# Patient Record
Sex: Male | Born: 1945 | Race: White | Hispanic: No | Marital: Single | Smoking: Never smoker
Health system: Southern US, Community
[De-identification: ages and names within clinical notes are randomized; demographics above are authoritative.]

---

## 2005-11-30 ENCOUNTER — Emergency Department: Payer: Self-pay | Admitting: Emergency Medicine

## 2005-12-04 ENCOUNTER — Emergency Department: Payer: Self-pay | Admitting: Emergency Medicine

## 2006-08-26 ENCOUNTER — Emergency Department: Payer: Self-pay | Admitting: Unknown Physician Specialty

## 2015-12-30 ENCOUNTER — Inpatient Hospital Stay
Admission: EM | Admit: 2015-12-30 | Discharge: 2016-02-15 | DRG: 870 | Disposition: E | Payer: Medicare Other | Attending: Internal Medicine | Admitting: Internal Medicine

## 2015-12-30 ENCOUNTER — Emergency Department: Payer: Medicare Other

## 2015-12-30 ENCOUNTER — Encounter: Payer: Self-pay | Admitting: Radiology

## 2015-12-30 DIAGNOSIS — J189 Pneumonia, unspecified organism: Secondary | ICD-10-CM | POA: Insufficient documentation

## 2015-12-30 DIAGNOSIS — Z681 Body mass index (BMI) 19 or less, adult: Secondary | ICD-10-CM | POA: Diagnosis not present

## 2015-12-30 DIAGNOSIS — J101 Influenza due to other identified influenza virus with other respiratory manifestations: Secondary | ICD-10-CM | POA: Diagnosis not present

## 2015-12-30 DIAGNOSIS — R6520 Severe sepsis without septic shock: Secondary | ICD-10-CM | POA: Diagnosis present

## 2015-12-30 DIAGNOSIS — D62 Acute posthemorrhagic anemia: Secondary | ICD-10-CM | POA: Diagnosis not present

## 2015-12-30 DIAGNOSIS — T798XXA Other early complications of trauma, initial encounter: Secondary | ICD-10-CM

## 2015-12-30 DIAGNOSIS — E8770 Fluid overload, unspecified: Secondary | ICD-10-CM | POA: Diagnosis not present

## 2015-12-30 DIAGNOSIS — I248 Other forms of acute ischemic heart disease: Secondary | ICD-10-CM | POA: Diagnosis present

## 2015-12-30 DIAGNOSIS — T380X5A Adverse effect of glucocorticoids and synthetic analogues, initial encounter: Secondary | ICD-10-CM | POA: Diagnosis not present

## 2015-12-30 DIAGNOSIS — L89150 Pressure ulcer of sacral region, unstageable: Secondary | ICD-10-CM | POA: Diagnosis not present

## 2015-12-30 DIAGNOSIS — J9621 Acute and chronic respiratory failure with hypoxia: Secondary | ICD-10-CM | POA: Diagnosis not present

## 2015-12-30 DIAGNOSIS — J1008 Influenza due to other identified influenza virus with other specified pneumonia: Secondary | ICD-10-CM | POA: Diagnosis present

## 2015-12-30 DIAGNOSIS — I4891 Unspecified atrial fibrillation: Secondary | ICD-10-CM | POA: Diagnosis not present

## 2015-12-30 DIAGNOSIS — R042 Hemoptysis: Secondary | ICD-10-CM | POA: Diagnosis not present

## 2015-12-30 DIAGNOSIS — R69 Illness, unspecified: Secondary | ICD-10-CM | POA: Diagnosis not present

## 2015-12-30 DIAGNOSIS — R000 Tachycardia, unspecified: Secondary | ICD-10-CM | POA: Diagnosis not present

## 2015-12-30 DIAGNOSIS — A419 Sepsis, unspecified organism: Secondary | ICD-10-CM | POA: Diagnosis not present

## 2015-12-30 DIAGNOSIS — E875 Hyperkalemia: Secondary | ICD-10-CM | POA: Diagnosis not present

## 2015-12-30 DIAGNOSIS — J9601 Acute respiratory failure with hypoxia: Secondary | ICD-10-CM | POA: Diagnosis present

## 2015-12-30 DIAGNOSIS — I469 Cardiac arrest, cause unspecified: Secondary | ICD-10-CM | POA: Diagnosis not present

## 2015-12-30 DIAGNOSIS — K921 Melena: Secondary | ICD-10-CM | POA: Diagnosis not present

## 2015-12-30 DIAGNOSIS — J129 Viral pneumonia, unspecified: Secondary | ICD-10-CM | POA: Diagnosis present

## 2015-12-30 DIAGNOSIS — J9600 Acute respiratory failure, unspecified whether with hypoxia or hypercapnia: Secondary | ICD-10-CM

## 2015-12-30 DIAGNOSIS — J811 Chronic pulmonary edema: Secondary | ICD-10-CM | POA: Diagnosis not present

## 2015-12-30 DIAGNOSIS — F05 Delirium due to known physiological condition: Secondary | ICD-10-CM | POA: Diagnosis not present

## 2015-12-30 DIAGNOSIS — R6521 Severe sepsis with septic shock: Secondary | ICD-10-CM | POA: Diagnosis present

## 2015-12-30 DIAGNOSIS — N179 Acute kidney failure, unspecified: Secondary | ICD-10-CM | POA: Diagnosis present

## 2015-12-30 DIAGNOSIS — I10 Essential (primary) hypertension: Secondary | ICD-10-CM | POA: Diagnosis present

## 2015-12-30 DIAGNOSIS — R140 Abdominal distension (gaseous): Secondary | ICD-10-CM

## 2015-12-30 DIAGNOSIS — Y92230 Patient room in hospital as the place of occurrence of the external cause: Secondary | ICD-10-CM | POA: Diagnosis not present

## 2015-12-30 DIAGNOSIS — E876 Hypokalemia: Secondary | ICD-10-CM | POA: Diagnosis not present

## 2015-12-30 DIAGNOSIS — J80 Acute respiratory distress syndrome: Secondary | ICD-10-CM | POA: Diagnosis not present

## 2015-12-30 DIAGNOSIS — R739 Hyperglycemia, unspecified: Secondary | ICD-10-CM | POA: Diagnosis present

## 2015-12-30 DIAGNOSIS — J9690 Respiratory failure, unspecified, unspecified whether with hypoxia or hypercapnia: Secondary | ICD-10-CM

## 2015-12-30 DIAGNOSIS — K567 Ileus, unspecified: Secondary | ICD-10-CM | POA: Diagnosis not present

## 2015-12-30 DIAGNOSIS — R079 Chest pain, unspecified: Secondary | ICD-10-CM | POA: Diagnosis not present

## 2015-12-30 DIAGNOSIS — R0600 Dyspnea, unspecified: Secondary | ICD-10-CM

## 2015-12-30 DIAGNOSIS — I959 Hypotension, unspecified: Secondary | ICD-10-CM | POA: Diagnosis not present

## 2015-12-30 DIAGNOSIS — A4189 Other specified sepsis: Principal | ICD-10-CM | POA: Diagnosis present

## 2015-12-30 DIAGNOSIS — R918 Other nonspecific abnormal finding of lung field: Secondary | ICD-10-CM

## 2015-12-30 DIAGNOSIS — W06XXXA Fall from bed, initial encounter: Secondary | ICD-10-CM | POA: Diagnosis not present

## 2015-12-30 DIAGNOSIS — R1110 Vomiting, unspecified: Secondary | ICD-10-CM

## 2015-12-30 DIAGNOSIS — Z452 Encounter for adjustment and management of vascular access device: Secondary | ICD-10-CM

## 2015-12-30 DIAGNOSIS — Z9911 Dependence on respirator [ventilator] status: Secondary | ICD-10-CM | POA: Diagnosis not present

## 2015-12-30 DIAGNOSIS — J849 Interstitial pulmonary disease, unspecified: Secondary | ICD-10-CM | POA: Diagnosis not present

## 2015-12-30 DIAGNOSIS — R410 Disorientation, unspecified: Secondary | ICD-10-CM | POA: Diagnosis not present

## 2015-12-30 DIAGNOSIS — L8990 Pressure ulcer of unspecified site, unspecified stage: Secondary | ICD-10-CM | POA: Insufficient documentation

## 2015-12-30 DIAGNOSIS — Z4659 Encounter for fitting and adjustment of other gastrointestinal appliance and device: Secondary | ICD-10-CM

## 2015-12-30 LAB — URINALYSIS COMPLETE WITH MICROSCOPIC (ARMC ONLY)
BACTERIA UA: NONE SEEN
BILIRUBIN URINE: NEGATIVE
Glucose, UA: NEGATIVE mg/dL
Ketones, ur: NEGATIVE mg/dL
LEUKOCYTES UA: NEGATIVE
NITRITE: NEGATIVE
PH: 5 (ref 5.0–8.0)
PROTEIN: 100 mg/dL — AB
RBC / HPF: NONE SEEN RBC/hpf (ref 0–5)
Specific Gravity, Urine: 1.049 — ABNORMAL HIGH (ref 1.005–1.030)

## 2015-12-30 LAB — CBC WITH DIFFERENTIAL/PLATELET
Basophils Absolute: 0 10*3/uL (ref 0–0.1)
Basophils Relative: 0 %
Eosinophils Absolute: 0 10*3/uL (ref 0–0.7)
Eosinophils Relative: 0 %
HEMATOCRIT: 53 % — AB (ref 40.0–52.0)
HEMOGLOBIN: 18 g/dL (ref 13.0–18.0)
LYMPHS ABS: 0.5 10*3/uL — AB (ref 1.0–3.6)
LYMPHS PCT: 7 %
MCH: 29.9 pg (ref 26.0–34.0)
MCHC: 34 g/dL (ref 32.0–36.0)
MCV: 87.8 fL (ref 80.0–100.0)
Monocytes Absolute: 0.8 10*3/uL (ref 0.2–1.0)
Monocytes Relative: 11 %
NEUTROS ABS: 5.9 10*3/uL (ref 1.4–6.5)
NEUTROS PCT: 82 %
Platelets: 196 10*3/uL (ref 150–440)
RBC: 6.03 MIL/uL — AB (ref 4.40–5.90)
RDW: 13.9 % (ref 11.5–14.5)
WBC: 7.3 10*3/uL (ref 3.8–10.6)

## 2015-12-30 LAB — BLOOD GAS, VENOUS
ACID-BASE EXCESS: 4.8 mmol/L — AB (ref 0.0–3.0)
Bicarbonate: 30.4 mEq/L — ABNORMAL HIGH (ref 21.0–28.0)
FIO2: 0.21
PH VEN: 7.41 (ref 7.320–7.430)
Patient temperature: 37
pCO2, Ven: 48 mmHg (ref 44.0–60.0)
pO2, Ven: <31 mmHg (ref 30.0–45.0)

## 2015-12-30 LAB — PROTIME-INR
INR: 1.01
PROTHROMBIN TIME: 13.5 s (ref 11.4–15.0)

## 2015-12-30 LAB — TROPONIN I
TROPONIN I: 0.05 ng/mL — AB (ref <0.031)
Troponin I: 0.05 ng/mL — ABNORMAL HIGH (ref <0.031)
Troponin I: 0.06 ng/mL — ABNORMAL HIGH (ref <0.031)

## 2015-12-30 LAB — LACTIC ACID, PLASMA
LACTIC ACID, VENOUS: 1.6 mmol/L (ref 0.5–2.0)
Lactic Acid, Venous: 2.7 mmol/L (ref 0.5–2.0)

## 2015-12-30 LAB — COMPREHENSIVE METABOLIC PANEL
ALBUMIN: 3.4 g/dL — AB (ref 3.5–5.0)
ALT: 41 U/L (ref 17–63)
ANION GAP: 10 (ref 5–15)
AST: 85 U/L — ABNORMAL HIGH (ref 15–41)
Alkaline Phosphatase: 58 U/L (ref 38–126)
BUN: 49 mg/dL — ABNORMAL HIGH (ref 6–20)
CHLORIDE: 92 mmol/L — AB (ref 101–111)
CO2: 31 mmol/L (ref 22–32)
Calcium: 8.4 mg/dL — ABNORMAL LOW (ref 8.9–10.3)
Creatinine, Ser: 1.76 mg/dL — ABNORMAL HIGH (ref 0.61–1.24)
GFR calc non Af Amer: 37 mL/min — ABNORMAL LOW (ref >60)
GFR, EST AFRICAN AMERICAN: 43 mL/min — AB (ref >60)
GLUCOSE: 139 mg/dL — AB (ref 65–99)
Potassium: 3.8 mmol/L (ref 3.5–5.1)
SODIUM: 133 mmol/L — AB (ref 135–145)
Total Bilirubin: 0.6 mg/dL (ref 0.3–1.2)
Total Protein: 7.2 g/dL (ref 6.5–8.1)

## 2015-12-30 LAB — RAPID INFLUENZA A&B ANTIGENS: Influenza B (ARMC): NEGATIVE

## 2015-12-30 LAB — RAPID INFLUENZA A&B ANTIGENS (ARMC ONLY): INFLUENZA A (ARMC): NEGATIVE

## 2015-12-30 MED ORDER — ONDANSETRON HCL 4 MG/2ML IJ SOLN: 4.0000 mg | Freq: Four times a day (QID) | INTRAMUSCULAR | Status: DC | PRN

## 2015-12-30 MED ORDER — DEXTROSE 5 % IV SOLN
1.0000 g | INTRAVENOUS | Status: DC
  Administered 2015-12-31: 1 g via INTRAVENOUS
  Filled 2015-12-30: qty 10

## 2015-12-30 MED ORDER — ACETAMINOPHEN 650 MG RE SUPP: 650.0000 mg | Freq: Four times a day (QID) | RECTAL | Status: DC | PRN

## 2015-12-30 MED ORDER — IPRATROPIUM-ALBUTEROL 0.5-2.5 (3) MG/3ML IN SOLN
3.0000 mL | Freq: Once | RESPIRATORY_TRACT | Status: AC
  Administered 2015-12-30: 3 mL via RESPIRATORY_TRACT
  Filled 2015-12-30: qty 3

## 2015-12-30 MED ORDER — SODIUM CHLORIDE 0.9 % IV BOLUS (SEPSIS)
1000.0000 mL | Freq: Once | INTRAVENOUS | Status: AC
  Administered 2015-12-30: 1000 mL via INTRAVENOUS

## 2015-12-30 MED ORDER — METHYLPREDNISOLONE SODIUM SUCC 125 MG IJ SOLR
125.0000 mg | INTRAMUSCULAR | Status: AC
  Administered 2015-12-30: 125 mg via INTRAVENOUS
  Filled 2015-12-30: qty 2

## 2015-12-30 MED ORDER — BISACODYL 5 MG PO TBEC: 5.0000 mg | DELAYED_RELEASE_TABLET | Freq: Every day | ORAL | Status: DC | PRN

## 2015-12-30 MED ORDER — DEXTROSE 5 % IV SOLN
500.0000 mg | INTRAVENOUS | Status: DC
  Filled 2015-12-30 (×2): qty 500

## 2015-12-30 MED ORDER — POLYETHYLENE GLYCOL 3350 17 G PO PACK
17.0000 g | PACK | Freq: Every day | ORAL | Status: DC | PRN
Start: 2015-12-30 — End: 2016-01-03

## 2015-12-30 MED ORDER — HYDROCOD POLST-CPM POLST ER 10-8 MG/5ML PO SUER
5.0000 mL | Freq: Two times a day (BID) | ORAL | Status: DC | PRN
  Administered 2015-12-30: 5 mL via ORAL
  Filled 2015-12-30: qty 5

## 2015-12-30 MED ORDER — ENOXAPARIN SODIUM 40 MG/0.4ML ~~LOC~~ SOLN
40.0000 mg | SUBCUTANEOUS | Status: DC
  Administered 2015-12-31 – 2016-01-01 (×2): 40 mg via SUBCUTANEOUS
  Filled 2015-12-30 (×2): qty 0.4

## 2015-12-30 MED ORDER — IOHEXOL 300 MG/ML  SOLN: 75.0000 mL | Freq: Once | INTRAMUSCULAR | Status: DC | PRN

## 2015-12-30 MED ORDER — ASPIRIN 81 MG PO CHEW
324.0000 mg | CHEWABLE_TABLET | Freq: Once | ORAL | Status: AC
  Administered 2015-12-30: 324 mg via ORAL
  Filled 2015-12-30: qty 4

## 2015-12-30 MED ORDER — ACETAMINOPHEN 325 MG PO TABS
650.0000 mg | ORAL_TABLET | Freq: Four times a day (QID) | ORAL | Status: DC | PRN
  Administered 2016-01-01: 650 mg via ORAL
  Filled 2015-12-30 (×3): qty 2

## 2015-12-30 MED ORDER — IPRATROPIUM-ALBUTEROL 0.5-2.5 (3) MG/3ML IN SOLN: 3.0000 mL | RESPIRATORY_TRACT | Status: DC | PRN

## 2015-12-30 MED ORDER — IOHEXOL 350 MG/ML SOLN
75.0000 mL | Freq: Once | INTRAVENOUS | Status: AC | PRN
  Administered 2015-12-30: 75 mL via INTRAVENOUS

## 2015-12-30 MED ORDER — DEXTROSE 5 % IV SOLN
1.0000 g | Freq: Once | INTRAVENOUS | Status: AC
  Administered 2015-12-30: 1 g via INTRAVENOUS
  Filled 2015-12-30: qty 10

## 2015-12-30 MED ORDER — DEXTROSE 5 % IV SOLN
500.0000 mg | Freq: Once | INTRAVENOUS | Status: AC
  Administered 2015-12-31: 500 mg via INTRAVENOUS
  Filled 2015-12-30: qty 500

## 2015-12-30 MED ORDER — DEXTROSE 5 % IV SOLN
1.0000 g | Freq: Once | INTRAVENOUS | Status: AC
  Administered 2015-12-31: 1 g via INTRAVENOUS
  Filled 2015-12-30: qty 10

## 2015-12-30 MED ORDER — ONDANSETRON HCL 4 MG PO TABS: 4.0000 mg | ORAL_TABLET | Freq: Four times a day (QID) | ORAL | Status: DC | PRN

## 2015-12-30 MED ORDER — POTASSIUM CHLORIDE IN NACL 20-0.9 MEQ/L-% IV SOLN
INTRAVENOUS | Status: DC
  Administered 2015-12-30 – 2015-12-31 (×2): via INTRAVENOUS
  Filled 2015-12-30 (×3): qty 1000

## 2015-12-30 MED ORDER — DEXTROSE 5 % IV SOLN
500.0000 mg | Freq: Once | INTRAVENOUS | Status: AC
  Administered 2015-12-30: 500 mg via INTRAVENOUS
  Filled 2015-12-30: qty 500

## 2015-12-30 NOTE — ED Notes (Signed)
Pt presents to ED via EMS from home with SOB and cough. Per EMS pt has not been disoriented for them. Currently answers questions appropriately.

## 2015-12-30 NOTE — ED Provider Notes (Signed)
[facility] Emergency Department Provider Note  ____________________________________________  Time seen: Approximately 3:16 PM  I have reviewed the triage vital signs and the nursing notes.   HISTORY  Chief Complaint Shortness of Breath and Cough  EM caveat: Some very poor historian, questionable altered mental status  HPI Larry Pacheco is a 70 y.o. male reports evidently having some confusion. The patient tells me that he was up watching in 5 days ago he started developing cough, shortness of breath, and thinks been having fevers. EMS reported that there was concern the patient was disoriented.Patient denies pain, states he feels fatigued and like he might have "pneumonia". He also has not had an influenza shot.     History reviewed. No pertinent past medical history.  There are no active problems to display for this patient.   No past surgical history on file.  No current outpatient prescriptions on file. Patient states he does not have any medical problems The patient states he has no drug allergies  Allergies Review of patient's allergies indicates no known allergies.  No family history on file.  Social History Social History  Substance Use Topics  . Smoking status: None  . Smokeless tobacco: None  . Alcohol Use: None   patient denies being a smoker, no history of lung problems and does not have emphysema or COPD. Denies alcohol use.  Review of Systems Constitutional: Fever and chills Eyes: No visual changes. ENT: No sore throat. Cardiovascular: Denies chest pain. Respiratory: Short of breath and coughing. Feels better on oxygen. Gastrointestinal: No abdominal pain.  No nausea, no vomiting.  No diarrhea.  No constipation. Genitourinary: Negative for dysuria. Musculoskeletal: Negative for back pain. Skin: Negative for rash. Neurological: Negative for headaches, focal weakness or numbness.  10-point ROS otherwise  negative.  ____________________________________________   PHYSICAL EXAM:  VITAL SIGNS: ED Triage Vitals  Enc Vitals Group     BP 12/22/2015 1441 150/100 mmHg     Pulse Rate 12/23/2015 1441 130     Resp 01/08/2016 1441 20     Temp 01/11/2016 1441 99.3 F (37.4 C)     Temp Source 12/23/2015 1441 Oral     SpO2 12/19/2015 1441 92 %     Weight 12/18/2015 1441 130 lb (58.968 kg)     Height 12/29/2015 1441 5\' 6"  (1.676 m)     Head Cir --      Peak Flow --      Pain Score --      Pain Loc --      Pain Edu? --      Excl. in GC? --    Constitutional: Alert and oriented to self date and time, but very slow to recall events some seeming slight confusion at times. Generally fatigued appearing but in no overt distress. Eyes: Conjunctivae are normal. PERRL. EOMI. Head: Atraumatic. Nose: No congestion/rhinnorhea. Mouth/Throat: Mucous membranes are very dry.  Oropharynx non-erythematous. Neck: No stridor.   Cardiovascular: Tachycardic rate, regular rhythm. Grossly normal heart sounds.  Good peripheral circulation. Respiratory: Slight tachypnea, speaks in full sentences. No retractions. Rales in both bases bilaterally. No wheezing. Gastrointestinal: Soft and nontender. No distention. No abdominal bruits. No CVA tenderness. Musculoskeletal: No lower extremity tenderness nor edema.  No joint effusions. Neurologic:  Normal speech and language. No gross focal neurologic deficits are appreciated. No gait instability. Skin:  Skin is warm, dry and intact. No rash noted. Psychiatric: Mood and affect are normal. Speech and behavior are normal.  ____________________________________________   LABS (  all labs ordered are listed, but only abnormal results are displayed)  Labs Reviewed  LACTIC ACID, PLASMA - Abnormal; Notable for the following:    Lactic Acid, Venous 2.7 (*)    All other components within normal limits  COMPREHENSIVE METABOLIC PANEL - Abnormal; Notable for the following:    Sodium 133 (*)     Chloride 92 (*)    Glucose, Bld 139 (*)    BUN 49 (*)    Creatinine, Ser 1.76 (*)    Calcium 8.4 (*)    Albumin 3.4 (*)    AST 85 (*)    GFR calc non Af Amer 37 (*)    GFR calc Af Amer 43 (*)    All other components within normal limits  TROPONIN I - Abnormal; Notable for the following:    Troponin I 0.05 (*)    All other components within normal limits  CBC WITH DIFFERENTIAL/PLATELET - Abnormal; Notable for the following:    RBC 6.03 (*)    HCT 53.0 (*)    Lymphs Abs 0.5 (*)    All other components within normal limits  BLOOD GAS, VENOUS - Abnormal; Notable for the following:    Bicarbonate 30.4 (*)    Acid-Base Excess 4.8 (*)    All other components within normal limits  RAPID INFLUENZA A&B ANTIGENS ([facility] ONLY)  CULTURE, BLOOD (ROUTINE X 2)  CULTURE, BLOOD (ROUTINE X 2)  URINE CULTURE  PROTIME-INR  LACTIC ACID, PLASMA  URINALYSIS COMPLETEWITH MICROSCOPIC ([facility] ONLY)  TROPONIN I   ____________________________________________  EKG  Reviewed and interpreted by me at 1445 Ventricular rate 1:30 PR 140 QRS 70 QTc 4:30 Sinus tachycardia, probable atrial enlargement. Patient does have somewhat hyperacute appearing T waves in V2, but no other evidence of acute abnormality suggest ST elevation MI. Patient is not having any chest pain. ____________________________________________  RADIOLOGY  DG Chest 2 View (Final result) Result time: 01/03/2016 15:36:43   Final result by Rad Results In Interface (01/08/2016 15:36:43)   Narrative:   CLINICAL DATA: Cough and shortness of breath  EXAM: CHEST 2 VIEW  COMPARISON: None.  FINDINGS: There is an element of hyperinflation suggesting the possibility of COPD. The heart size and vascular pattern are normal. There is no consolidation or pleural effusion. There is moderately prominent interstitial change bilaterally with an apical basilar gradient, most severe at the lung bases.  IMPRESSION: Findings suggest COPD.  Moderately severe interstitial disease. Acuity uncertain as there are no comparison studies. Appearance suggests possibility of chronic interstitial lung disease but superimposed atypical pneumonia not excluded.   Electronically Signed By: Esperanza Heir M.D. On: 12/26/2015 15:36   CT Angio Chest PE W/Cm &/Or Wo Cm (Final result) Result time: 01/04/2016 17:24:07   Final result by Rad Results In Interface (01/06/2016 17:24:07)   Narrative:   CLINICAL DATA: Dyspnea. Cough. Chest pain.  EXAM: CT ANGIOGRAPHY CHEST WITH CONTRAST  TECHNIQUE: Multidetector CT imaging of the chest was performed using the standard protocol during bolus administration of intravenous contrast. Multiplanar CT image reconstructions and MIPs were obtained to evaluate the vascular anatomy.  CONTRAST: 75mL OMNIPAQUE IOHEXOL 350 MG/ML SOLN  COMPARISON: Chest radiograph from earlier today. No prior chest CT.  FINDINGS: Mediastinum/Nodes: The study is moderate quality for the evaluation of pulmonary embolism, limited by motion artifact particularly at the lung bases. There are no filling defects in the central, lobar, segmental or subsegmental pulmonary artery branches to suggest acute pulmonary embolism. Great vessels are normal in course and caliber. Normal  heart size. No pericardial fluid/thickening. Coronary atherosclerosis. Normal visualized thyroid. Mild circumferential wall thickening in the mid to lower thoracic esophagus. No axillary adenopathy. Mildly enlarged 1.2 cm subcarinal node (series 5/ image 69). No additional pathologically enlarged mediastinal or hilar nodes.  Lungs/Pleura: No pneumothorax. No pleural effusion. There is extensive patchy reticulation and ground-glass opacity throughout both lungs involving the lung periphery greater than the peribronchovascular lungs. No traction bronchiectasis or frank honeycombing. No acute consolidative airspace disease, significant pulmonary  nodules or lung masses.  Upper abdomen: Simple 1.1 cm lateral segment left liver lobe cyst.  Musculoskeletal: No aggressive appearing focal osseous lesions.  Review of the MIP images confirms the above findings.  IMPRESSION: 1. No evidence of acute pulmonary embolism, with limitations as described. 2. Extensive patchy peripheral predominant ground-glass opacity and reticulation throughout both lungs, of uncertain acuity given lack of prior chest imaging studies for comparison. Differential includes nonspecific interstitial pneumonia (NSIP) versus other atypical inflammatory conditions such as cryptogenic organizing pneumonia, eosinophilic pneumonia or vasculitis. A follow-up high-resolution chest CT study in 3-6 months is recommended. 3. Mild subcarinal mediastinal lymphadenopathy, nonspecific and likely reactive. 4. Nonspecific mild circumferential wall thickening in the mid to lower thoracic esophagus, most suggestive of a nonspecific esophagitis.   Electronically Signed By: Delbert Phenix M.D. On: 01/01/2016 17:24    ____________________________________________   PROCEDURES  Procedure(s) performed: None  Critical Care performed: Yes, see critical care note(s)  CRITICAL CARE Performed by: Sharyn Creamer   Total critical care time: 45 minutes  Critical care time was exclusive of separately billable procedures and treating other patients.  Critical care was necessary to treat or prevent imminent or life-threatening deterioration.  Critical care was time spent personally by me on the following activities: development of treatment plan with patient and/or surrogate as well as nursing, discussions with consultants, evaluation of patient's response to treatment, examination of patient, obtaining history from patient or surrogate, ordering and performing treatments and interventions, ordering and review of laboratory studies, ordering and review of radiographic studies,  pulse oximetry and re-evaluation of patient's condition.  Patient presents with sepsis, heart rate greater than 120, two IV antibiotics ordered. ____________________________________________   INITIAL IMPRESSION / ASSESSMENT AND PLAN / ED COURSE  Pertinent labs & imaging results that were available during my care of the patient were reviewed by me and considered in my medical decision making (see chart for details).  Patient presents with fever cough congestion and dyspnea. No chest pain. He is acutely tachycardic, mildly hypoxic with oxygen saturations as low as 85% on room air during my evaluation. He does improve with oxygen. Concerning symptoms for possible viral syndrome, influenza, pneumonia far less likely thought to be acute coronary syndrome as EKG does demonstrate hyperacute T waves in the anterior distribution. He is not having any chest pain however.  ----------------------------------------- 4:36 PM on 01/04/2016 -----------------------------------------  Patient reports some improvement after nebulizers. Heart rate and work of breathing is improving. He is not a smoker it would seem unusual have a first presentation of COPD, we are treating him for this and his symptoms are improving. In this setting would consider the possibility of acute pulmonary wasn't his flu test is negative chest x-ray demonstrates no clear infiltrate. Obtain CT imaging and anticipate admission in the hospital due to oxygen deficit, tachycardia, and concerns for possible sepsis. Trop slightly elevated 0.05 which is suspect is due to demand ischemia as the patient reports symptoms for the last 4-5 days, and I do not find  any symptoms to suggest an obvious acute coronary syndrome at this time.  CT demonstrates no obvious cause for symptoms. Discussed with the patient need for follow-up within 6 months regarding CT findings and pulmonary nodules. He is agreeable with  this.  ----------------------------------------- 5:48 PM on 01/11/2016 -----------------------------------------  Patient reports improvement. Lactate elevated, currently heart rate 120, O2 sat 94% blood pressure 139/85. Lungs are clear, work of breathing is improving slowly. Admitting hospital. CT concerning for possible infectious etiology versus vasculitis. Require further workup for hospital service. Covering for atypicals and community-acquired pneumonia. ____________________________________________   FINAL CLINICAL IMPRESSION(S) / ED DIAGNOSES  Final diagnoses:  Dyspnea  Community acquired pneumonia  Pulmonary infiltrates on CXR  Sepsis, due to unspecified organism [facility])      Sharyn Creamer, MD 12/23/2015 [phone]

## 2015-12-30 NOTE — H&P (Signed)
[facility] - [place] at [facility]   PATIENT NAME: Larry Pacheco    MR#:  [id number]  DATE OF BIRTH:  December 26, 1945  DATE OF ADMISSION:  01/01/2016  PRIMARY CARE PHYSICIAN: [facility]  REQUESTING/REFERRING PHYSICIAN: Dr. Fanny Bien  CHIEF COMPLAINT:   Chief Complaint  Patient presents with  . Shortness of Breath  . Cough    HISTORY OF PRESENT ILLNESS:  Larry Pacheco  is a 70 y.o. male with with no known past medical history presents to the emergency room complaining of 5 days of severe cough and weakness. Patient [place] After [place] he had a mild cough which worsened quickly. Today he went to see his PCP at [facility] and was sent to the emergency room. Here patient's CT scan shows bilateral atypical pneumonia along with sepsis with elevated lactic acid present. He has no orthopnea or edema. No sick contacts. No recent antibiotic use. At baseline patient can walk a few miles without shortness of breath.  he uses a bicycle for transportation and doesn't own a car. No recent international travel. No pets.  PAST MEDICAL HISTORY:  History reviewed. No pertinent past medical history.  PAST SURGICAL HISTORY:  No past surgical history on file.  SOCIAL HISTORY:   Social History  Substance Use Topics  . Smoking status: Never Smoker   . Smokeless tobacco: Not on file  . Alcohol Use: Not on file    FAMILY HISTORY:   Family History  Problem Relation Age of Onset  . Bowel Disease Father     DRUG ALLERGIES:  No Known Allergies  REVIEW OF SYSTEMS:   Review of Systems  Constitutional: Positive for malaise/fatigue. Negative for fever, chills and weight loss.  HENT: Negative for hearing loss and nosebleeds.   Eyes: Negative for blurred vision, double vision and pain.  Respiratory: Positive for cough and sputum production. Negative for hemoptysis, shortness of breath and wheezing.    Cardiovascular: Negative for chest pain, palpitations, orthopnea and leg swelling.  Gastrointestinal: Negative for nausea, vomiting, abdominal pain, diarrhea and constipation.  Genitourinary: Negative for dysuria and hematuria.  Musculoskeletal: Negative for myalgias, back pain and falls.  Skin: Negative for rash.  Neurological: Positive for weakness. Negative for dizziness, tremors, sensory change, speech change, focal weakness, seizures and headaches.  Endo/Heme/Allergies: Does not bruise/bleed easily.  Psychiatric/Behavioral: Negative for depression and memory loss. The patient is not nervous/anxious.     MEDICATIONS AT HOME:   Prior to Admission medications   Not on File      VITAL SIGNS:  Blood pressure 155/94, pulse 49, temperature 99.3 F (37.4 C), temperature source Oral, resp. rate 29, height  (1.676 m), weight 58.968 kg (130 lb), SpO2 100 %.  PHYSICAL EXAMINATION:  Physical Exam  GENERAL:  70 y.o.-year-old patient lying in the bed with no acute distress.  EYES: Pupils equal, round, reactive to light and accommodation. No scleral icterus. Extraocular muscles intact.  HEENT: Head atraumatic, normocephalic. Oropharynx and nasopharynx clear. No oropharyngeal erythema, moist oral mucosa  NECK:  Supple, no jugular venous distention. No thyroid enlargement, no tenderness.  LUNGS: Decreased air entry on both sides. No wheezing or radials. CARDIOVASCULAR: S1, S2 normal. No murmurs, rubs, or gallops.  ABDOMEN: Soft, nontender, nondistended. Bowel sounds present. No organomegaly or mass.  EXTREMITIES: No pedal edema, cyanosis, or clubbing. + 2 pedal & radial pulses b/l.   NEUROLOGIC: Cranial nerves II through XII are intact. No  focal Motor or sensory deficits appreciated b/l PSYCHIATRIC: The patient is alert and oriented x 3. Anxious SKIN: No obvious rash, lesion, or ulcer.   LABORATORY PANEL:   CBC  Recent Labs Lab 01/08/2016 1501  WBC 7.3  HGB 18.0  HCT 53.0*  PLT  196   ------------------------------------------------------------------------------------------------------------------  Chemistries   Recent Labs Lab 12/19/2015 1501  NA 133*  K 3.8  CL 92*  CO2 31  GLUCOSE 139*  BUN 49*  CREATININE 1.76*  CALCIUM 8.4*  AST 85*  ALT 41  ALKPHOS 58  BILITOT 0.6   ------------------------------------------------------------------------------------------------------------------  Cardiac Enzymes  Recent Labs Lab 01/08/2016 1501  TROPONINI 0.05*   ------------------------------------------------------------------------------------------------------------------  RADIOLOGY:  Dg Chest 2 View  01/08/2016  CLINICAL DATA:  Cough and shortness of breath EXAM: CHEST  2 VIEW COMPARISON:  None. FINDINGS: There is an element of hyperinflation suggesting the possibility of COPD. The heart size and vascular pattern are normal. There is no consolidation or pleural effusion. There is moderately prominent interstitial change bilaterally with an apical basilar gradient, most severe at the lung bases. IMPRESSION: Findings suggest COPD. Moderately severe interstitial disease. Acuity uncertain as there are no comparison studies. Appearance suggests possibility of chronic interstitial lung disease but superimposed atypical pneumonia not excluded. Electronically Signed   By: Esperanza Heir M.D.   On: 12/18/2015 15:36   Ct Angio Chest Pe W/cm &/or Wo Cm  01/02/2016  CLINICAL DATA:  Dyspnea.  Cough.  Chest pain. EXAM: CT ANGIOGRAPHY CHEST WITH CONTRAST TECHNIQUE: Multidetector CT imaging of the chest was performed using the standard protocol during bolus administration of intravenous contrast. Multiplanar CT image reconstructions and MIPs were obtained to evaluate the vascular anatomy. CONTRAST:  75mL OMNIPAQUE IOHEXOL 350 MG/ML SOLN COMPARISON:  Chest radiograph from earlier today. No prior chest CT. FINDINGS: Mediastinum/Nodes: The study is moderate quality for the  evaluation of pulmonary embolism, limited by motion artifact particularly at the lung bases. There are no filling defects in the central, lobar, segmental or subsegmental pulmonary artery branches to suggest acute pulmonary embolism. Great vessels are normal in course and caliber. Normal heart size. No pericardial fluid/thickening. Coronary atherosclerosis. Normal visualized thyroid. Mild circumferential wall thickening in the mid to lower thoracic esophagus. No axillary adenopathy. Mildly enlarged 1.2 cm subcarinal node (series 5/ image 69). No additional pathologically enlarged mediastinal or hilar nodes. Lungs/Pleura: No pneumothorax. No pleural effusion. There is extensive patchy reticulation and ground-glass opacity throughout both lungs involving the lung periphery greater than the peribronchovascular lungs. No traction bronchiectasis or frank honeycombing. No acute consolidative airspace disease, significant pulmonary nodules or lung masses. Upper abdomen: Simple 1.1 cm lateral segment left liver lobe cyst. Musculoskeletal:  No aggressive appearing focal osseous lesions. Review of the MIP images confirms the above findings. IMPRESSION: 1. No evidence of acute pulmonary embolism, with limitations as described. 2. Extensive patchy peripheral predominant ground-glass opacity and reticulation throughout both lungs, of uncertain acuity given lack of prior chest imaging studies for comparison. Differential includes nonspecific interstitial pneumonia (NSIP) versus other atypical inflammatory conditions such as cryptogenic organizing pneumonia, eosinophilic pneumonia or vasculitis. A follow-up high-resolution chest CT study in 3-6 months is recommended. 3. Mild subcarinal mediastinal lymphadenopathy, nonspecific and likely reactive. 4. Nonspecific mild circumferential wall thickening in the mid to lower thoracic esophagus, most suggestive of a nonspecific esophagitis. Electronically Signed   By: Delbert Phenix M.D.    On: 12/27/2015 17:24     IMPRESSION AND PLAN:   * Bilateral atypical pneumonia with sepsis  IV fluid bolus per sepsis protocol. Repeat lactic acid. Start IV azithromycin and ceftriaxone. Check urine streptococcal and legionella antigen. Check sputum cultures.  * Acute renal failure due to sepsis IV fluid resuscitation Monitor input and output. Repeat labs in the morning.  * Minimal elevation of troponin of 0.05 likely demand ischemia. No chest pain. repeat labs  * DVT prophylaxis with Lovenox   All the records are reviewed and case discussed with ED provider. Management plans discussed with the patient, family and they are in agreement.  CODE STATUS: FULL  TOTAL TIME TAKING CARE OF THIS PATIENT:  minutes.    Milagros Loll R M.D on 01/10/2016 at 6:25 PM  Between 7am to 6pm - Pager - [phone]  After 6pm go to [email or web address] - password [email or web address] [facility]  [facility] [facility]  Office  [phone]  CC: Primary care physician; No primary care provider on file.   Note: This dictation was prepared with Dragon dictation along with smaller phrase technology. Any transcriptional errors that result from this process are unintentional.

## 2015-12-30 NOTE — Consult Note (Signed)
Pharmacy Antibiotic Note  Larry Pacheco is a 70 y.o. male admitted on 12/27/2015 with pneumonia.  Pharmacy has been consulted for ceftriaxone and azithromycin dosing.  Plan: ceftriaxone 1g q 24 hours, azithromycin 500 my q 24 hours  Height:  (167.6 cm) Weight: 130 lb (58.968 kg) IBW/kg (Calculated) : 63.8  Temp (24hrs), Avg:99.3 F (37.4 C), Min:99.3 F (37.4 C), Max:99.3 F (37.4 C)   Recent Labs Lab 12/29/2015 1501 12/18/2015 1555  WBC 7.3  --   CREATININE 1.76*  --   LATICACIDVEN  --  2.7*    Estimated Creatinine Clearance: 32.1 mL/min (by C-G formula based on Cr of 1.76).    No Known Allergies  Antimicrobials this admission: ceftriaxone 2/13 >>  azithromycin 2/13 >>   Dose adjustments this admission:   Microbiology results: 2/13 BCx: pending 2/13 UCx: pending  2/13 Sputum: needs to be collected  2/13 legionella antigen: needs to be collected 2/13 strep pneu antigen: collected  Thank you for allowing pharmacy to be a part of this patient's care.  Olene Floss 12/29/2015 6:59 PM

## 2015-12-31 LAB — BASIC METABOLIC PANEL
Anion gap: 6 (ref 5–15)
BUN: 29 mg/dL — ABNORMAL HIGH (ref 6–20)
CALCIUM: 7 mg/dL — AB (ref 8.9–10.3)
CHLORIDE: 109 mmol/L (ref 101–111)
CO2: 24 mmol/L (ref 22–32)
CREATININE: 0.97 mg/dL (ref 0.61–1.24)
GFR calc non Af Amer: >60 mL/min (ref >60)
GLUCOSE: 163 mg/dL — AB (ref 65–99)
Potassium: 3.8 mmol/L (ref 3.5–5.1)
Sodium: 139 mmol/L (ref 135–145)

## 2015-12-31 LAB — CBC
HEMATOCRIT: 44.9 % (ref 40.0–52.0)
HEMOGLOBIN: 15.6 g/dL (ref 13.0–18.0)
MCH: 30.7 pg (ref 26.0–34.0)
MCHC: 34.7 g/dL (ref 32.0–36.0)
MCV: 88.3 fL (ref 80.0–100.0)
Platelets: 180 10*3/uL (ref 150–440)
RBC: 5.08 MIL/uL (ref 4.40–5.90)
RDW: 14 % (ref 11.5–14.5)
WBC: 8 10*3/uL (ref 3.8–10.6)

## 2015-12-31 LAB — TROPONIN I: TROPONIN I: 0.09 ng/mL — AB (ref <0.031)

## 2015-12-31 MED ORDER — AMLODIPINE BESYLATE 5 MG PO TABS
5.0000 mg | ORAL_TABLET | Freq: Every day | ORAL | Status: DC
  Administered 2015-12-31 – 2016-01-06 (×7): 5 mg via ORAL
  Filled 2015-12-31 (×7): qty 1

## 2015-12-31 MED ORDER — CHLORPROMAZINE HCL 25 MG PO TABS
25.0000 mg | ORAL_TABLET | Freq: Three times a day (TID) | ORAL | Status: DC | PRN
  Administered 2015-12-31: 25 mg via ORAL
  Filled 2015-12-31 (×3): qty 1

## 2015-12-31 MED ORDER — IPRATROPIUM-ALBUTEROL 0.5-2.5 (3) MG/3ML IN SOLN
3.0000 mL | Freq: Four times a day (QID) | RESPIRATORY_TRACT | Status: DC
  Administered 2015-12-31 (×2): 3 mL via RESPIRATORY_TRACT
  Filled 2015-12-31 (×2): qty 3

## 2015-12-31 MED ORDER — METOPROLOL TARTRATE 1 MG/ML IV SOLN
5.0000 mg | Freq: Once | INTRAVENOUS | Status: AC
  Administered 2015-12-31: 5 mg via INTRAVENOUS
  Filled 2015-12-31: qty 5

## 2015-12-31 MED ORDER — LEVALBUTEROL HCL 1.25 MG/0.5ML IN NEBU: 1.2500 mg | INHALATION_SOLUTION | Freq: Four times a day (QID) | RESPIRATORY_TRACT | Status: DC

## 2015-12-31 MED ORDER — METOPROLOL TARTRATE 25 MG PO TABS
25.0000 mg | ORAL_TABLET | Freq: Two times a day (BID) | ORAL | Status: DC
  Administered 2016-01-01 – 2016-01-06 (×10): 25 mg via ORAL
  Filled 2015-12-31 (×12): qty 1

## 2015-12-31 MED ORDER — BUDESONIDE 0.25 MG/2ML IN SUSP: 0.2500 mg | Freq: Two times a day (BID) | RESPIRATORY_TRACT | Status: DC

## 2015-12-31 MED ORDER — METHYLPREDNISOLONE SODIUM SUCC 40 MG IJ SOLR
40.0000 mg | Freq: Four times a day (QID) | INTRAMUSCULAR | Status: DC
  Administered 2015-12-31 – 2016-01-01 (×4): 40 mg via INTRAVENOUS
  Filled 2015-12-31 (×5): qty 1

## 2015-12-31 MED ORDER — HYDROCHLOROTHIAZIDE 25 MG PO TABS
25.0000 mg | ORAL_TABLET | Freq: Every day | ORAL | Status: DC
  Administered 2015-12-31 – 2016-01-03 (×4): 25 mg via ORAL
  Filled 2015-12-31 (×5): qty 1

## 2015-12-31 MED ORDER — BUDESONIDE 0.25 MG/2ML IN SUSP
0.2500 mg | Freq: Two times a day (BID) | RESPIRATORY_TRACT | Status: DC
  Administered 2015-12-31 – 2016-01-06 (×11): 0.25 mg via RESPIRATORY_TRACT
  Filled 2015-12-31 (×12): qty 2

## 2015-12-31 NOTE — Progress Notes (Signed)
Spoke with Dr. Sheryle Hail regarding elevated troponin at 0.09. No New orders at this time.

## 2015-12-31 NOTE — Progress Notes (Signed)
Dr Anne Hahn called nurse after reviewing EKG results. Received order for lopressor 5 mg IV once. AC notified

## 2015-12-31 NOTE — Progress Notes (Signed)
[facility] -  at [facility]   PATIENT NAME: Larry Pacheco    MR#:  [id number]  DATE OF BIRTH:  1946/07/05  SUBJECTIVE:   Patient is here due to shortness of breath, cough and noted to have multifocal pneumonia. Has a raspy voice and continues to have a cough.  REVIEW OF SYSTEMS:    Review of Systems  Constitutional: Negative for fever and chills.  HENT: Negative for congestion and tinnitus.   Eyes: Negative for blurred vision and double vision.  Respiratory: Positive for cough and shortness of breath. Negative for wheezing.   Cardiovascular: Negative for chest pain, orthopnea and PND.  Gastrointestinal: Negative for nausea, vomiting, abdominal pain and diarrhea.  Genitourinary: Negative for dysuria and hematuria.  Neurological: Negative for dizziness, sensory change and focal weakness.  All other systems reviewed and are negative.   Nutrition: Regular Tolerating Diet: Yes Tolerating PT: Await evaluation   DRUG ALLERGIES:  No Known Allergies  VITALS:  Blood pressure 145/94, pulse 101, temperature 99 F (37.2 C), temperature source Oral, resp. rate 24, height  (1.676 m), weight 57.227 kg (126 lb 2.6 oz), SpO2 97 %.  PHYSICAL EXAMINATION:   Physical Exam  GENERAL:  70 y.o.-year-old patient lying in the bed in mild respiratory distress  EYES: Pupils equal, round, reactive to light and accommodation. No scleral icterus. Extraocular muscles intact.  HEENT: Head atraumatic, normocephalic. Oropharynx and nasopharynx clear.  NECK:  Supple, no jugular venous distention. No thyroid enlargement, no tenderness.  LUNGS: Diffuse rhonchi, rales. Good air entry bilaterally. No use of accessory muscles of respiration.  CARDIOVASCULAR: S1, S2 normal. No murmurs, rubs, or gallops.  ABDOMEN: Soft, nontender, nondistended. Bowel sounds present. No organomegaly or mass.  EXTREMITIES: No cyanosis, clubbing or edema b/l.    NEUROLOGIC: Cranial nerves II  through XII are intact. No focal Motor or sensory deficits b/l.   PSYCHIATRIC: The patient is alert and oriented x 3.  SKIN: No obvious rash, lesion, or ulcer.    LABORATORY PANEL:   CBC  Recent Labs Lab 12/31/15 0522  WBC 8.0  HGB 15.6  HCT 44.9  PLT 180   ------------------------------------------------------------------------------------------------------------------  Chemistries   Recent Labs Lab 01/05/2016 1501 12/31/15 0522  NA 133* 139  K 3.8 3.8  CL 92* 109  CO2 31 24  GLUCOSE 139* 163*  BUN 49* 29*  CREATININE 1.76* 0.97  CALCIUM 8.4* 7.0*  AST 85*  --   ALT 41  --   ALKPHOS 58  --   BILITOT 0.6  --    ------------------------------------------------------------------------------------------------------------------  Cardiac Enzymes  Recent Labs Lab 12/31/15 0522  TROPONINI 0.09*   ------------------------------------------------------------------------------------------------------------------  RADIOLOGY:  Dg Chest 2 View  12/19/2015  CLINICAL DATA:  Cough and shortness of breath EXAM: CHEST  2 VIEW COMPARISON:  None. FINDINGS: There is an element of hyperinflation suggesting the possibility of COPD. The heart size and vascular pattern are normal. There is no consolidation or pleural effusion. There is moderately prominent interstitial change bilaterally with an apical basilar gradient, most severe at the lung bases. IMPRESSION: Findings suggest COPD. Moderately severe interstitial disease. Acuity uncertain as there are no comparison studies. Appearance suggests possibility of chronic interstitial lung disease but superimposed atypical pneumonia not excluded. Electronically Signed   By: Esperanza Heir M.D.   On: 01/11/2016 15:36   Ct Angio Chest Pe W/cm &/or Wo Cm  12/18/2015  CLINICAL DATA:  Dyspnea.  Cough.  Chest pain. EXAM: CT ANGIOGRAPHY CHEST WITH CONTRAST  TECHNIQUE: Multidetector CT imaging of the chest was performed using the standard protocol  during bolus administration of intravenous contrast. Multiplanar CT image reconstructions and MIPs were obtained to evaluate the vascular anatomy. CONTRAST:  75mL OMNIPAQUE IOHEXOL 350 MG/ML SOLN COMPARISON:  Chest radiograph from earlier today. No prior chest CT. FINDINGS: Mediastinum/Nodes: The study is moderate quality for the evaluation of pulmonary embolism, limited by motion artifact particularly at the lung bases. There are no filling defects in the central, lobar, segmental or subsegmental pulmonary artery branches to suggest acute pulmonary embolism. Great vessels are normal in course and caliber. Normal heart size. No pericardial fluid/thickening. Coronary atherosclerosis. Normal visualized thyroid. Mild circumferential wall thickening in the mid to lower thoracic esophagus. No axillary adenopathy. Mildly enlarged 1.2 cm subcarinal node (series 5/ image 69). No additional pathologically enlarged mediastinal or hilar nodes. Lungs/Pleura: No pneumothorax. No pleural effusion. There is extensive patchy reticulation and ground-glass opacity throughout both lungs involving the lung periphery greater than the peribronchovascular lungs. No traction bronchiectasis or frank honeycombing. No acute consolidative airspace disease, significant pulmonary nodules or lung masses. Upper abdomen: Simple 1.1 cm lateral segment left liver lobe cyst. Musculoskeletal:  No aggressive appearing focal osseous lesions. Review of the MIP images confirms the above findings. IMPRESSION: 1. No evidence of acute pulmonary embolism, with limitations as described. 2. Extensive patchy peripheral predominant ground-glass opacity and reticulation throughout both lungs, of uncertain acuity given lack of prior chest imaging studies for comparison. Differential includes nonspecific interstitial pneumonia (NSIP) versus other atypical inflammatory conditions such as cryptogenic organizing pneumonia, eosinophilic pneumonia or vasculitis. A  follow-up high-resolution chest CT study in 3-6 months is recommended. 3. Mild subcarinal mediastinal lymphadenopathy, nonspecific and likely reactive. 4. Nonspecific mild circumferential wall thickening in the mid to lower thoracic esophagus, most suggestive of a nonspecific esophagitis. Electronically Signed   By: Delbert Phenix M.D.   On: 01/08/2016 17:24     ASSESSMENT AND PLAN:   70 year old male with no significant past medical history presents to the hospital with shortness of breath cough and congestion and noted to be in acute respiratory failure with hypoxia.  #1 acute respiratory failure with hypoxia-this is due to atypical pneumonia as seen on the CT chest. -Continue IV antibiotics with ceftriaxone and Zithromax -I will start the patient on IV steroids, DuoNeb nebs around-the-clock, Pulmicort nebs. Wean O2 as tolerated. -Await pulmonary input  #2 pneumonia-atypical and multifocal in nature. -Continue IV ceftriaxone, Zithromax. Follow sputum and blood cultures which are negative so far. - await Legionella and Mycoplasma Ag. Urinary antigen results.   #3 Essential HTN - no previous hx of HTN.  - will start on HCTZ, Norvasc and monitor.   #4 Elevated Troponin - likely in the setting of demand ischemia from hypoxia.  - no chest pain. Trop. Did not trend upwards.  - will d/c tele.    All the records are reviewed and case discussed with Care Management/Social Workerr. Management plans discussed with the patient, family and they are in agreement.  CODE STATUS: Full  DVT Prophylaxis: Lovenox  TOTAL TIME TAKING CARE OF THIS PATIENT: 30 minutes.   POSSIBLE D/C IN 1-2 DAYS, DEPENDING ON CLINICAL CONDITION.   Houston Siren M.D on 12/31/2015 at 1:39 PM  Between 7am to 6pm - Pager - [phone]  After 6pm go to [email or web address] - password [email or web address] [facility]  [place] [facility]  Office  [phone]  CC: Primary care physician; No primary care provider on file.

## 2015-12-31 NOTE — Care Management Note (Signed)
Case Management Note  Patient Details  Name: Larry Pacheco MRN: [id number] Date of Birth: Jul 07, 1946  Subjective/Objective:                  Met with patient to discuss discharge planning. Patient is followed by [facility] and has [facility] listed as PCP however patient has never been there and does not have a PCP. He said he uses [facility] for Rx. Patient may be referring to OTC Rx because they do not have a pharmacy for Rx. He has $8 cash in his wallet at bedside- RN aware. He typically uses his bicycle for transportation however he states he has enough money to get a taxi to home. O2 is new- No diagnosis for chronic O2. He gave me his address but he states he lives there alone.  Action/Plan: I have made appointment with [facility] for 01/16/16 at 1:45PM as new patient. RNCM will follow for discharge needs.   Expected Discharge Date:  01/02/16               Expected Discharge Plan:     In-House Referral:     Discharge planning Services  CM Consult  Post Acute Care Choice:    Choice offered to:  Patient  DME Arranged:    DME Agency:     HH Arranged:    [place] Agency:     Status of Service:  In process, will continue to follow  Medicare Important Message Given:    Date Medicare IM Given:    Medicare IM give by:    Date Additional Medicare IM Given:    Additional Medicare Important Message give by:     If discussed at [place] of Stay Meetings, dates discussed:    Additional Comments:  Marshell Garfinkel, RN 12/31/2015, 9:34 AM

## 2015-12-31 NOTE — Progress Notes (Signed)
Notified Dr Anne Hahn of pt's  Heart rate 144 and respirations of 36. New orders obtained for cardiac monitoring and stat EKG

## 2016-01-01 ENCOUNTER — Inpatient Hospital Stay: Payer: Medicare Other

## 2016-01-01 DIAGNOSIS — J849 Interstitial pulmonary disease, unspecified: Secondary | ICD-10-CM

## 2016-01-01 DIAGNOSIS — R0600 Dyspnea, unspecified: Secondary | ICD-10-CM

## 2016-01-01 DIAGNOSIS — J189 Pneumonia, unspecified organism: Secondary | ICD-10-CM

## 2016-01-01 LAB — CBC
HCT: 40.5 % (ref 40.0–52.0)
Hemoglobin: 13.9 g/dL (ref 13.0–18.0)
MCH: 30.1 pg (ref 26.0–34.0)
MCHC: 34.3 g/dL (ref 32.0–36.0)
MCV: 87.7 fL (ref 80.0–100.0)
PLATELETS: 203 10*3/uL (ref 150–440)
RBC: 4.62 MIL/uL (ref 4.40–5.90)
RDW: 14 % (ref 11.5–14.5)
WBC: 12.3 10*3/uL — AB (ref 3.8–10.6)

## 2016-01-01 LAB — C DIFFICILE QUICK SCREEN W PCR REFLEX
C DIFFICILE (CDIFF) INTERP: NEGATIVE
C DIFFICILE (CDIFF) TOXIN: NEGATIVE
C DIFFICLE (CDIFF) ANTIGEN: NEGATIVE

## 2016-01-01 LAB — GASTROINTESTINAL PANEL BY PCR, STOOL (REPLACES STOOL CULTURE)

## 2016-01-01 LAB — MRSA PCR SCREENING: MRSA by PCR: NEGATIVE

## 2016-01-01 LAB — BASIC METABOLIC PANEL
ANION GAP: 10 (ref 5–15)
BUN: 28 mg/dL — ABNORMAL HIGH (ref 6–20)
CALCIUM: 7.1 mg/dL — AB (ref 8.9–10.3)
CO2: 21 mmol/L — ABNORMAL LOW (ref 22–32)
Chloride: 103 mmol/L (ref 101–111)
Creatinine, Ser: 1.14 mg/dL (ref 0.61–1.24)
GLUCOSE: 157 mg/dL — AB (ref 65–99)
Potassium: 3.8 mmol/L (ref 3.5–5.1)
SODIUM: 134 mmol/L — AB (ref 135–145)

## 2016-01-01 LAB — URINE CULTURE: CULTURE: NO GROWTH

## 2016-01-01 LAB — EXPECTORATED SPUTUM ASSESSMENT W GRAM STAIN, RFLX TO RESP C: Special Requests: NORMAL

## 2016-01-01 LAB — GLUCOSE, CAPILLARY: Glucose-Capillary: 174 mg/dL — ABNORMAL HIGH (ref 65–99)

## 2016-01-01 LAB — HEMOGLOBIN A1C: HEMOGLOBIN A1C: 5.6 % (ref 4.0–6.0)

## 2016-01-01 LAB — EXPECTORATED SPUTUM ASSESSMENT W REFEX TO RESP CULTURE

## 2016-01-01 LAB — OCCULT BLOOD X 1 CARD TO LAB, STOOL: Fecal Occult Bld: POSITIVE — AB

## 2016-01-01 LAB — STREP PNEUMONIAE URINARY ANTIGEN: STREP PNEUMO URINARY ANTIGEN: NEGATIVE

## 2016-01-01 MED ORDER — METHYLPREDNISOLONE SODIUM SUCC 40 MG IJ SOLR
40.0000 mg | Freq: Two times a day (BID) | INTRAMUSCULAR | Status: DC
  Administered 2016-01-02 – 2016-01-09 (×15): 40 mg via INTRAVENOUS
  Filled 2016-01-01 (×15): qty 1

## 2016-01-01 MED ORDER — VANCOMYCIN HCL IN DEXTROSE 1-5 GM/200ML-% IV SOLN
1000.0000 mg | Freq: Once | INTRAVENOUS | Status: AC
  Administered 2016-01-01: 1000 mg via INTRAVENOUS
  Filled 2016-01-01: qty 200

## 2016-01-01 MED ORDER — SODIUM CHLORIDE 0.9 % IV BOLUS (SEPSIS)
1000.0000 mL | INTRAVENOUS | Status: DC | PRN
  Administered 2016-01-01 (×2): 1000 mL via INTRAVENOUS
  Filled 2016-01-01 (×3): qty 1000

## 2016-01-01 MED ORDER — HALOPERIDOL LACTATE 5 MG/ML IJ SOLN: 2.0000 mg | Freq: Once | INTRAMUSCULAR | Status: DC

## 2016-01-01 MED ORDER — ENSURE ENLIVE PO LIQD: 237.0000 mL | Freq: Two times a day (BID) | ORAL | Status: DC

## 2016-01-01 MED ORDER — VANCOMYCIN HCL IN DEXTROSE 1-5 GM/200ML-% IV SOLN
1000.0000 mg | INTRAVENOUS | Status: DC
  Administered 2016-01-01: 1000 mg via INTRAVENOUS
  Filled 2016-01-01: qty 200

## 2016-01-01 MED ORDER — PIPERACILLIN-TAZOBACTAM 3.375 G IVPB
3.3750 g | Freq: Three times a day (TID) | INTRAVENOUS | Status: DC
Start: 2016-01-01 — End: 2016-01-08
  Administered 2016-01-01 – 2016-01-08 (×22): 3.375 g via INTRAVENOUS
  Filled 2016-01-01 (×26): qty 50

## 2016-01-01 MED ORDER — LEVALBUTEROL HCL 1.25 MG/0.5ML IN NEBU
1.2500 mg | INHALATION_SOLUTION | Freq: Four times a day (QID) | RESPIRATORY_TRACT | Status: DC | PRN
  Administered 2016-01-01: 1.25 mg via RESPIRATORY_TRACT
  Filled 2016-01-01: qty 0.5

## 2016-01-01 MED ORDER — SODIUM CHLORIDE 3 % IN NEBU
5.0000 mL | INHALATION_SOLUTION | Freq: Once | RESPIRATORY_TRACT | Status: AC
  Administered 2016-01-01: 5 mL via RESPIRATORY_TRACT
  Filled 2016-01-01: qty 8

## 2016-01-01 NOTE — Progress Notes (Signed)
[facility] - [place]  at [facility]   PATIENT NAME: Larry Pacheco    MR#:  [id number]  DATE OF BIRTH:  01-08-1946  SUBJECTIVE:   Pt. Had a fall last night and got agitated and then tachycardic and had increasing O2 requirements and was transferred to CCU.  Still tachycardic.    REVIEW OF SYSTEMS:    Review of Systems  Constitutional: Negative for fever and chills.  HENT: Negative for congestion and tinnitus.   Eyes: Negative for blurred vision and double vision.  Respiratory: Positive for cough and shortness of breath. Negative for wheezing.   Cardiovascular: Negative for chest pain, orthopnea and PND.  Gastrointestinal: Negative for nausea, vomiting, abdominal pain and diarrhea.  Genitourinary: Negative for dysuria and hematuria.  Neurological: Positive for weakness ( generalized). Negative for dizziness, sensory change and focal weakness.  All other systems reviewed and are negative.   Nutrition: Regular Tolerating Diet: Yes Tolerating PT: Await evaluation   DRUG ALLERGIES:  No Known Allergies  VITALS:  Blood pressure 114/70, pulse 112, temperature 98.3 F (36.8 C), temperature source Oral, resp. rate 28, height  (1.676 m), weight 57.227 kg (126 lb 2.6 oz), SpO2 93 %.  PHYSICAL EXAMINATION:   Physical Exam  GENERAL:  70 y.o.-year-old patient lying in the bed in mild respiratory distress  EYES: Pupils equal, round, reactive to light and accommodation. No scleral icterus. Extraocular muscles intact.  HEENT: Head atraumatic, normocephalic. Oropharynx and nasopharynx clear.  NECK:  Supple, no jugular venous distention. No thyroid enlargement, no tenderness.  LUNGS: Diffuse rhonchi, rales. Good air entry bilaterally. No use of accessory muscles of respiration.  CARDIOVASCULAR: S1, S2 normal. No murmurs, rubs, or gallops.  ABDOMEN: Soft, nontender, nondistended. Bowel sounds present. No organomegaly or mass.  EXTREMITIES: No cyanosis, clubbing  or edema b/l.    NEUROLOGIC: Cranial nerves II through XII are intact. No focal Motor or sensory deficits b/l.   PSYCHIATRIC: The patient is alert and oriented x 3.  SKIN: No obvious rash, lesion, or ulcer.    LABORATORY PANEL:   CBC  Recent Labs Lab 01/01/16 0207  WBC 12.3*  HGB 13.9  HCT 40.5  PLT 203   ------------------------------------------------------------------------------------------------------------------  Chemistries   Recent Labs Lab 01/07/2016 1501  01/01/16 0207  NA 133*  < > 134*  K 3.8  < > 3.8  CL 92*  < > 103  CO2 31  < > 21*  GLUCOSE 139*  < > 157*  BUN 49*  < > 28*  CREATININE 1.76*  < > 1.14  CALCIUM 8.4*  < > 7.1*  AST 85*  --   --   ALT 41  --   --   ALKPHOS 58  --   --   BILITOT 0.6  --   --   < > = values in this interval not displayed. ------------------------------------------------------------------------------------------------------------------  Cardiac Enzymes  Recent Labs Lab 12/31/15 0522  TROPONINI 0.09*   ------------------------------------------------------------------------------------------------------------------  RADIOLOGY:  Dg Chest 1 View  01/01/2016  CLINICAL DATA:  Patient's coughing up blood tonight. Shortness of breath. EXAM: CHEST 1 VIEW COMPARISON:  01/09/2016 FINDINGS: Borderline heart size with normal pulmonary vascularity for technique. Suggestion of patchy developing airspace disease in the right mid lung could represent pneumonia or hemorrhage. No blunting of costophrenic angles. No pneumothorax. IMPRESSION: Developing focal infiltration in the right mid lung. Electronically Signed   By: Burman Nieves M.D.   On: 01/01/2016 01:56   Ct Head Wo Contrast  01/01/2016  CLINICAL DATA:  Fall out of bed in the hospital.  Confusion. EXAM: CT HEAD WITHOUT CONTRAST TECHNIQUE: Contiguous axial images were obtained from the base of the skull through the vertex without intravenous contrast. COMPARISON:  None. FINDINGS:  There is atrophy and chronic small vessel disease changes. No acute intracranial abnormality. Specifically, no hemorrhage, hydrocephalus, mass lesion, acute infarction, or significant intracranial injury. No acute calvarial abnormality. Posterior mucosal thickening versus air-fluid level in the right maxillary sinus. Remainder the paranasal sinuses and mastoid air cells are clear. IMPRESSION: No acute intracranial abnormality. Atrophy, chronic microvascular disease. Electronically Signed   By: Charlett Nose M.D.   On: 01/01/2016 10:30   Ct Angio Chest Pe W/cm &/or Wo Cm  01/13/2016  CLINICAL DATA:  Dyspnea.  Cough.  Chest pain. EXAM: CT ANGIOGRAPHY CHEST WITH CONTRAST TECHNIQUE: Multidetector CT imaging of the chest was performed using the standard protocol during bolus administration of intravenous contrast. Multiplanar CT image reconstructions and MIPs were obtained to evaluate the vascular anatomy. CONTRAST:  75mL OMNIPAQUE IOHEXOL 350 MG/ML SOLN COMPARISON:  Chest radiograph from earlier today. No prior chest CT. FINDINGS: Mediastinum/Nodes: The study is moderate quality for the evaluation of pulmonary embolism, limited by motion artifact particularly at the lung bases. There are no filling defects in the central, lobar, segmental or subsegmental pulmonary artery branches to suggest acute pulmonary embolism. Great vessels are normal in course and caliber. Normal heart size. No pericardial fluid/thickening. Coronary atherosclerosis. Normal visualized thyroid. Mild circumferential wall thickening in the mid to lower thoracic esophagus. No axillary adenopathy. Mildly enlarged 1.2 cm subcarinal node (series 5/ image 69). No additional pathologically enlarged mediastinal or hilar nodes. Lungs/Pleura: No pneumothorax. No pleural effusion. There is extensive patchy reticulation and ground-glass opacity throughout both lungs involving the lung periphery greater than the peribronchovascular lungs. No traction  bronchiectasis or frank honeycombing. No acute consolidative airspace disease, significant pulmonary nodules or lung masses. Upper abdomen: Simple 1.1 cm lateral segment left liver lobe cyst. Musculoskeletal:  No aggressive appearing focal osseous lesions. Review of the MIP images confirms the above findings. IMPRESSION: 1. No evidence of acute pulmonary embolism, with limitations as described. 2. Extensive patchy peripheral predominant ground-glass opacity and reticulation throughout both lungs, of uncertain acuity given lack of prior chest imaging studies for comparison. Differential includes nonspecific interstitial pneumonia (NSIP) versus other atypical inflammatory conditions such as cryptogenic organizing pneumonia, eosinophilic pneumonia or vasculitis. A follow-up high-resolution chest CT study in 3-6 months is recommended. 3. Mild subcarinal mediastinal lymphadenopathy, nonspecific and likely reactive. 4. Nonspecific mild circumferential wall thickening in the mid to lower thoracic esophagus, most suggestive of a nonspecific esophagitis. Electronically Signed   By: Delbert Phenix M.D.   On: 12/29/2015 17:24     ASSESSMENT AND PLAN:   70 year old male with no significant past medical history presents to the hospital with shortness of breath cough and congestion and noted to be in acute respiratory failure with hypoxia.  #1 acute respiratory failure with hypoxia-this is due to atypical pneumonia (vs) interstitial lung disease as seen on the CT chest. -Continue IV antibiotics with Zosyn - cont. IV steroids, DuoNeb nebs around-the-clock, Pulmicort nebs. Wean O2 as tolerated. -Appreciate pulmonary input and the plan to do a bronchoscopy tomorrow.  #2 pneumonia-atypical and multifocal in nature. -Continue IV Zosyn. Follow sputum and blood cultures which are negative so far. - await Legionella and Mycoplasma Ag. Urinary antigen results.   #3 Essential HTN - no previous hx of HTN.  - cont.  HCTZ,  Norvasc, Norvasc and it has improved.   #4 Elevated Troponin - likely in the setting of demand ischemia from hypoxia.  - no chest pain. Trop. Did not trend upwards.  - will check 2-D Echo.   #5 Agitation/AMS - etiology unclear.  - ?? Sundowning but improved today.  PRN Haldol CT head (-).   #6. Diarrhea - ?? Related to abx.  - resolved now. Stool PCR (-).     All the records are reviewed and case discussed with Care Management/Social Workerr. Management plans discussed with the patient, family and they are in agreement.  CODE STATUS: Full  DVT Prophylaxis: Lovenox  TOTAL TIME TAKING CARE OF THIS PATIENT: 30 minutes.   POSSIBLE D/C IN 2-3 DAYS, DEPENDING ON CLINICAL CONDITION.   Houston Siren M.D on 01/01/2016 at 4:18 PM  Between 7am to 6pm - Pager - [phone]  After 6pm go to [email or web address] - password [email or web address] [facility]  [place] [facility]  Office  [phone]  CC: Primary care physician; No primary care provider on file.

## 2016-01-01 NOTE — Progress Notes (Signed)
Pt began coughing and spit out coffee brown sputum.  Elink notified no new orders at this time. Hr remains Sinus Tach.  Will continue to assess.

## 2016-01-01 NOTE — Progress Notes (Signed)
Nutrition Follow-up    INTERVENTION:   Meals and Snacks: Cater to patient preferences, likes yogurt and fruit Medical Food Supplement Therapy: recommend addition of Ensure Enlive po BID, each supplement provides 350 kcal and 20 grams of protein   NUTRITION DIAGNOSIS:   Inadequate oral intake related to acute illness as evidenced by meal completion < 50%.   GOAL:   Patient will meet greater than or equal to 90% of their needs   MONITOR:    (Energy Intake, Anthropometrics, Digestive System)  REASON FOR ASSESSMENT:     Poor PO  ASSESSMENT:    Pt admitted with bilateral pneumonia, sepsis, ARF  History reviewed. No pertinent past medical history.   Diet Order:  Diet regular Room service appropriate?: Yes; Fluid consistency:: Thin Diet NPO time specified   Energy Intake: recorded po intake 26% of meals on average  Food and Nutrition Related History: pt reports good appetite prior to admission but unable to exactly say what he has been eating, does not use nutritional supplements  Digestive System: +loose stool, C.diff negative  Skin:  Reviewed, no issues  Last BM:  01/01/16    Recent Labs Lab 12/26/2015 1501 12/31/15 0522 01/01/16 0207  NA 133* 139 134*  K 3.8 3.8 3.8  CL 92* 109 103  CO2 31 24 21*  BUN 49* 29* 28*  CREATININE 1.76* 0.97 1.14  CALCIUM 8.4* 7.0* 7.1*  GLUCOSE 139* 163* 157*    Glucose Profile:   Recent Labs  01/01/16 0157  GLUCAP 174*   Meds: solumedrol  Nutrition Focused Physical Exam: Nutrition-Focused physical exam completed. Findings are no fat depletion, mild muscle depletion, and no edema.   Height:   Ht Readings from Last 1 Encounters:  01/10/2016  (1.676 m)    Weight: pt reports UBW around 130 pounds  Wt Readings from Last 1 Encounters:  12/31/15 126 lb 2.6 oz (57.227 kg)    Filed Weights   01/07/2016 1441 12/31/15 0409 12/31/15 0414  Weight: 130 lb (58.968 kg) 126 lb 2.6 oz (57.227 kg) 126 lb 2.6 oz (57.227 kg)     BMI:  Body mass index is 20.37 kg/(m^2).  Estimated Nutritional Needs:   Kcal:  1670-1975 kcals (BEE 1266, 1.2 AF, 1.1-1.3 IF)   Protein:  63-74 g (1.1-1.3 g/kg)   Fluid:  1425-1710 mL (25-30 ml/kg)   MODERATE Care Level  Romelle Starcher MS, RD, LDN [phone] Pager  [phone] Weekend/On-Call Pager

## 2016-01-01 NOTE — Progress Notes (Signed)
Called by nursing tonight for persistent tachycardia in this patient.  EKG confirmed sinus tach.  Good response to beta blocker, but short lived.  Patient then developed some hemoptysis.  He had CTA chest on 2/13 which showed multifocal PNA vs vasculitis vs COP.  Being treated with broad abx here.  Other than persistent sinus tach and mild intermittent hypoxia corrected with nonrebreather his vitals are stable.  CXR showed developing opacity right middle lung - PNA vs hemorrhage.  Ordered preliminary screening vasculitis workup  Kristeen [facility] 01/01/2016, 3:13 AM

## 2016-01-01 NOTE — Progress Notes (Signed)
Pharmacy Antibiotic Note  Larry Pacheco is a 70 y.o. male admitted on 01/03/2016 with pneumonia and sepsis.  Pharmacy has been consulted for Zosyn dosing.  Plan: MRSA PCR negative - discontinue vancomycin per primary team. Will continue patient on Zosyn 3.375 gm IV Q8H EI  Height:  (167.6 cm) Weight: 126 lb 2.6 oz (57.227 kg) IBW/kg (Calculated) : 63.8  Temp (24hrs), Avg:100 F (37.8 C), Min:98.3 F (36.8 C), Max:102.2 F (39 C)   Recent Labs Lab 12/27/2015 1501 12/23/2015 1555 12/25/2015 1836 12/31/15 0522 01/01/16 0207  WBC 7.3  --   --  8.0 12.3*  CREATININE 1.76*  --   --  0.97 1.14  LATICACIDVEN  --  2.7* 1.6  --   --     Estimated Creatinine Clearance: 48.1 mL/min (by C-G formula based on Cr of 1.14).    No Known Allergies    Ceftriaxone 2/13 >>2/14 Azithromycin 2/13 >>2/14 Vanc 2/15>>2/15 Zosyn 2/15>>  Pharmacy will continue to monitor and adjust per consult.  Nelli Swalley L, Pharm.D. 01/01/2016 7:46 PM

## 2016-01-01 NOTE — Consult Note (Signed)
PULMONARY / CRITICAL CARE MEDICINE   Name: Larry Pacheco MRN: [id number] DOB: 04-02-1946    ADMISSION DATE:  12/29/2015 CONSULTATION DATE:  01/01/16  REFERRING MD : Dr. Cherlynn Kaiser   CHIEF COMPLAINT:   Reason for consult - persistent dyspnea and mild hemoptysis   HISTORY OF PRESENT ILLNESS   Patient is a 70 year old male with no significant past medical history, that was admitted on 01/03/2016 for shortness of breath cough and intermittent hemoptysis at home over the past 5 days. History per patient and review of chart. 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 Patient is currently at his baseline mentation, he states the cough as his voice raspy and he denies any fevers, chills, shortness of breath. Off note he does not wear oxygen at home, he is a never smoker, however here in the hospital he is requiring up to 3 L of oxygen to keep his O2 saturation greater than 88%.  SIGNIFICANT EVENTS  2/13>> admitted to general medical floor for shortness of breath, bilateral pneumonia 2/14>> accidental fall on the medical floor, had tachycardia, worsening hypoxia requiring to use oxygen, transferred to the ICU for closer monitoring    PAST MEDICAL HISTORY    :  History reviewed. No pertinent past medical history. No past surgical history on file. Prior to Admission medications   Not on File   No Known Allergies   FAMILY HISTORY   Family History  Problem Relation Age of Onset  . Bowel Disease Father       SOCIAL HISTORY    reports  that he has never smoked. He does not have any smokeless tobacco history on file. His alcohol and drug histories are not on file.  Review of Systems  Constitutional: Negative for fever and chills.  HENT: Positive for sore throat.   Eyes: Negative for blurred vision and double vision.  Respiratory: Positive for cough, hemoptysis and shortness of breath. Negative for wheezing and stridor.   Cardiovascular: Negative for chest pain, palpitations and orthopnea.  Gastrointestinal: Negative for heartburn and nausea.  Genitourinary: Negative for dysuria.  Musculoskeletal: Negative for myalgias and neck pain.  Skin: Negative for itching and rash.  Neurological: Negative for dizziness and headaches.  Endo/Heme/Allergies: Does not bruise/bleed easily.      VITAL SIGNS    Temp:  [98.1 F (36.7 C)-102.2 F (39 C)] 98.3 F (36.8 C) (02/15 0800) Pulse Rate:  [95-152] 112 (02/15 1400) Resp:  [15-38] 28 (02/15 1400) BP: (85-145)/(53-96) 114/70 mmHg (02/15 1400) SpO2:  [90 %-100 %] 93 % (02/15 1400) FiO2 (%):  [100 %] 100 % (02/15 0100) HEMODYNAMICS:   VENTILATOR SETTINGS: Vent Mode:  [-]  FiO2 (%):  [100 %] 100 % INTAKE / OUTPUT:  Intake/Output Summary (Last 24 hours) at 01/01/16 1532 Last data filed at 01/01/16 1246  Gross per 24 hour  Intake   2860 ml  Output    450 ml  Net   2410 ml       PHYSICAL EXAM   Physical Exam  Constitutional: He  is oriented to person, place, and time. He appears well-developed and well-nourished.  HENT:  Head: Normocephalic and atraumatic.  Right Ear: External ear normal.  Left Ear: External ear normal.  Nose: Nose normal.  Mouth/Throat: Oropharynx is clear and moist.  Eyes: Conjunctivae and EOM are normal. Pupils are equal, round, and reactive to light.  Neck: Normal range of motion. Neck supple.  Cardiovascular: Normal heart sounds and intact distal pulses.  Exam reveals no friction rub.   No murmur heard. tachycardia  Pulmonary/Chest: He  has rales.  Abdominal: Soft. Bowel sounds are normal. He exhibits no distension. There is no tenderness.  Musculoskeletal: Normal range of motion. He exhibits no edema.  Neurological: He is alert and oriented to person, place, and time.  Skin: Skin is warm and dry.  Nursing note and vitals reviewed.      LABS   LABS:  CBC  Recent Labs Lab 12/31/2015 1501 12/31/15 0522 01/01/16 0207  WBC 7.3 8.0 12.3*  HGB 18.0 15.6 13.9  HCT 53.0* 44.9 40.5  PLT 196 180 203   Coag's  Recent Labs Lab 01/01/2016 1501  INR 1.01   BMET  Recent Labs Lab 12/29/2015 1501 12/31/15 0522 01/01/16 0207  NA 133* 139 134*  K 3.8 3.8 3.8  CL 92* 109 103  CO2 31 24 21*  BUN 49* 29* 28*  CREATININE 1.76* 0.97 1.14  GLUCOSE 139* 163* 157*   Electrolytes  Recent Labs Lab 12/24/2015 1501 12/31/15 0522 01/01/16 0207  CALCIUM 8.4* 7.0* 7.1*   Sepsis Markers  Recent Labs Lab 01/09/2016 1555 12/22/2015 1836  LATICACIDVEN 2.7* 1.6   ABG No results for input(s): PHART, PCO2ART, PO2ART in the last 168 hours. Liver Enzymes  Recent Labs Lab 01/11/2016 1501  AST 85*  ALT 41  ALKPHOS 58  BILITOT 0.6  ALBUMIN 3.4*   Cardiac Enzymes  Recent Labs Lab 12/18/2015 1836 01/08/2016 1958 12/31/15 0522  TROPONINI 0.06* 0.05* 0.09*   Glucose  Recent Labs Lab 01/01/16 0157  GLUCAP 174*     Recent Results (from the past 240 hour(s))  Rapid Influenza A&B Antigens ([facility] only)     Status: None   Collection Time: 12/21/2015  3:01 PM  Result Value Ref Range Status   Influenza A ([facility]) NEGATIVE  Final   Influenza B ([facility]) NEGATIVE  Final  Blood Culture (routine x 2)     Status: None (Preliminary result)   Collection Time: 12/27/2015  3:55 PM  Result Value Ref Range Status   Specimen Description BLOOD RIGHT ASSIST CONTROL  Final   Special Requests   Final    BOTTLES DRAWN AEROBIC AND ANAEROBIC  6CC AERO, 5CC ANA   Culture NO GROWTH 2 DAYS  Final   Report Status PENDING  Incomplete  Blood  Culture (routine x 2)     Status: None (Preliminary result)   Collection Time: 12/18/2015  4:01 PM  Result Value Ref Range Status   Specimen Description BLOOD LEFT ASSIST CONTROL  Final   Special Requests   Final    BOTTLES DRAWN AEROBIC AND ANAEROBIC  3CC AERO, 2CC ANA   Culture NO GROWTH 2 DAYS  Final   Report Status PENDING  Incomplete  Urine culture     Status: None   Collection Time: 12/23/2015  6:36 PM  Result Value Ref Range Status   Specimen Description URINE, RANDOM  Final   Special Requests NONE  Final   Culture NO GROWTH 1 DAY  Final   Report Status  01/01/2016 FINAL  Final  MRSA PCR Screening     Status: None   Collection Time: 01/01/16  2:08 AM  Result Value Ref Range Status   MRSA by PCR NEGATIVE NEGATIVE Final    Comment:        The GeneXpert MRSA Assay (FDA approved for NASAL specimens only), is one component of a comprehensive MRSA colonization surveillance program. It is not intended to diagnose MRSA infection nor to guide or monitor treatment for MRSA infections.   Gastrointestinal Panel by PCR , Stool     Status: None   Collection Time: 01/01/16  6:59 AM  Result Value Ref Range Status   Campylobacter species NOT DETECTED NOT DETECTED Final   Plesimonas shigelloides NOT DETECTED NOT DETECTED Final   Salmonella species NOT DETECTED NOT DETECTED Final   Yersinia enterocolitica NOT DETECTED NOT DETECTED Final   Vibrio species NOT DETECTED NOT DETECTED Final   Vibrio cholerae NOT DETECTED NOT DETECTED Final   Enteroaggregative E coli (EAEC) NOT DETECTED NOT DETECTED Final   Enteropathogenic E coli (EPEC) NOT DETECTED NOT DETECTED Final   Enterotoxigenic E coli (ETEC) NOT DETECTED NOT DETECTED Final   Shiga like toxin producing E coli (STEC) NOT DETECTED NOT DETECTED Final   E. coli O157 NOT DETECTED NOT DETECTED Final   Shigella/Enteroinvasive E coli (EIEC) NOT DETECTED NOT DETECTED Final   Cryptosporidium NOT DETECTED NOT DETECTED Final   Cyclospora  cayetanensis NOT DETECTED NOT DETECTED Final   Entamoeba histolytica NOT DETECTED NOT DETECTED Final   Giardia lamblia NOT DETECTED NOT DETECTED Final   Adenovirus F40/41 NOT DETECTED NOT DETECTED Final   Astrovirus NOT DETECTED NOT DETECTED Final   Norovirus GI/GII NOT DETECTED NOT DETECTED Final   Rotavirus A NOT DETECTED NOT DETECTED Final   Sapovirus (I, II, IV, and V) NOT DETECTED NOT DETECTED Final  C difficile quick scan w PCR reflex     Status: None   Collection Time: 01/01/16  6:59 AM  Result Value Ref Range Status   C Diff antigen NEGATIVE NEGATIVE Final   C Diff toxin NEGATIVE NEGATIVE Final   C Diff interpretation Negative for C. difficile  Final     Current facility-administered medications:  .  acetaminophen (TYLENOL) tablet 650 mg, 650 mg, Oral, Q6H PRN, 650 mg at 01/01/16 0242 **OR** acetaminophen (TYLENOL) suppository 650 mg, 650 mg, Rectal, Q6H PRN, Srikar Sudini, MD .  amLODipine (NORVASC) tablet 5 mg, 5 mg, Oral, Daily, Cindi Carbon, RPH, 5 mg at 01/01/16 0909 .  bisacodyl (DULCOLAX) EC tablet 5 mg, 5 mg, Oral, Daily PRN, Srikar Sudini, MD .  budesonide (PULMICORT) nebulizer solution 0.25 mg, 0.25 mg, Nebulization, BID, Houston Siren, MD, 0.25 mg at 12/31/15 2045 .  chlorpheniramine-HYDROcodone (TUSSIONEX) 10-8 MG/5ML suspension 5 mL, 5 mL, Oral, Q12H PRN, Milagros Loll, MD, 5 mL at 12/31/15 2101 .  chlorproMAZINE (THORAZINE) tablet 25 mg, 25 mg, Oral, TID PRN, Houston Siren, MD, 25 mg at 12/31/15 1445 .  enoxaparin (LOVENOX) injection 40 mg, 40 mg, Subcutaneous, Q24H, Srikar Sudini, MD, 40 mg at 12/31/15 1812 .  haloperidol lactate (HALDOL) injection 2 mg, 2 mg, Intravenous, Once, Arnaldo Natal, MD .  hydrochlorothiazide (HYDRODIURIL) tablet 25 mg, 25 mg, Oral, Daily, Cindi Carbon, RPH, 25 mg at 01/01/16 0909 .  levalbuterol (XOPENEX) nebulizer solution 1.25 mg, 1.25 mg, Nebulization, Q6H PRN, Oralia Manis, MD .  methylPREDNISolone sodium succinate  (SOLU-MEDROL) 40 mg/mL injection 40 mg, 40 mg, Intravenous, Q6H, Vivek  Jasper Loser, MD, 40 mg at 01/01/16 1522 .  metoprolol tartrate (LOPRESSOR) tablet 25 mg, 25 mg, Oral, BID, Oralia Manis, MD, 25 mg at 01/01/16 0908 .  ondansetron (ZOFRAN) tablet 4 mg, 4 mg, Oral, Q6H PRN **OR** ondansetron (ZOFRAN) injection 4 mg, 4 mg, Intravenous, Q6H PRN, Srikar Sudini, MD .  piperacillin-tazobactam (ZOSYN) IVPB 3.375 g, 3.375 g, Intravenous, 3 times per day, Oralia Manis, MD, 3.375 g at 01/01/16 1246 .  polyethylene glycol (MIRALAX / GLYCOLAX) packet 17 g, 17 g, Oral, Daily PRN, Srikar Sudini, MD .  sodium chloride 0.9 % bolus 1,000 mL, 1,000 mL, Intravenous, PRN, Oralia Manis, MD, 1,000 mL at 01/01/16 0600 .  sodium chloride HYPERTONIC 3 % nebulizer solution 5 mL, 5 mL, Nebulization, Once, Marisol Giambra, MD .  vancomycin (VANCOCIN) IVPB 1000 mg/200 mL premix, 1,000 mg, Intravenous, Q18H, Houston Siren, MD, 1,000 mg at 01/01/16 1522  IMAGING    Dg Chest 1 View  01/01/2016  CLINICAL DATA:  Patient's coughing up blood tonight. Shortness of breath. EXAM: CHEST 1 VIEW COMPARISON:  January 20, 2016 FINDINGS: Borderline heart size with normal pulmonary vascularity for technique. Suggestion of patchy developing airspace disease in the right mid lung could represent pneumonia or hemorrhage. No blunting of costophrenic angles. No pneumothorax. IMPRESSION: Developing focal infiltration in the right mid lung. Electronically Signed   By: Burman Nieves M.D.   On: 01/01/2016 01:56   Ct Head Wo Contrast  01/01/2016  CLINICAL DATA:  Fall out of bed in the hospital.  Confusion. EXAM: CT HEAD WITHOUT CONTRAST TECHNIQUE: Contiguous axial images were obtained from the base of the skull through the vertex without intravenous contrast. COMPARISON:  None. FINDINGS: There is atrophy and chronic small vessel disease changes. No acute intracranial abnormality. Specifically, no hemorrhage, hydrocephalus, mass lesion, acute infarction, or  significant intracranial injury. No acute calvarial abnormality. Posterior mucosal thickening versus air-fluid level in the right maxillary sinus. Remainder the paranasal sinuses and mastoid air cells are clear. IMPRESSION: No acute intracranial abnormality. Atrophy, chronic microvascular disease. Electronically Signed   By: Charlett Nose M.D.   On: 01/01/2016 10:30      Indwelling Urinary Catheter continued, requirement due to   Reason to continue Indwelling Urinary Catheter for strict Intake/Output monitoring for hemodynamic instability   Central Line continued, requirement due to   Reason to continue [employer] Monitoring of central venous pressure or other hemodynamic parameters   Ventilator continued, requirement due to, resp failure    Ventilator Sedation RASS 0 to -2   Cultures: BCx2 2/13>> UC  Sputum 2/13>> Influenza negative.  Antibiotics: Zosyn 2/15>> Vanc 2/15>> Lines:   ASSESSMENT/PLAN  70 year old male no significant past medical history now with multifocal pneumonia, cough, dyspnea, accidental fall, requiring supplemental oxygen, chest CT concerning for interstitial lung disease versus multifocal pneumonia  PULMONARY Bilateral basilar pneumonia/multifocal pneumonia Hypoxia Dyspnea P:   -CT chest from January 20, 2016 reviewed, there is extensive groundglass opacity and reticulation in both lungs no prior studies to compare with, differentials include interstitial lung disease versus bilateral atypical pneumonia. -Plan for bronchoscopy with BAL tomorrow -Continue with steroids and antibiotics for now -Could be a hypersensitivity type reaction given his acute onset after leaving the [facility] -Continue with supportive care -Keep O2 saturations greater than 88% -May supplemental oxygen  CARDIOVASCULAR Elevated troponin Hypertension P:  Continued) monitoring Continue with home meds  RENAL ICU electrolyte replacement protocol  GASTROINTESTINAL Proton pump  inhibitor for stress ulcer prophylaxis  HEMATOLOGIC Monitor CBC  INFECTIOUS Possible multifocal/bilateral  pneumonia Continue current antibiotics Plan for bronchoscopy within the next 24 hours with BAL   ENDOCRINE ICU hypo/hyperglycemia protocol  NEUROLOGIC Accidental fall Confusion - improving P:   RASS goal: 0 CT head negative for acute hemorrhage     I have personally obtained a history, examined the patient, evaluated laboratory and imaging results, formulated the assessment and plan and placed orders.  Pulmonary Consult Time devoted to patient care services described in this note is 45 minutes.   Stephanie Acre, MD [facility] Pager [phone] (please enter 7-digits) On Call Pager - [phone] (please enter 7-digits)     01/01/2016, 3:32 PM  Note: This note was prepared with Dragon dictation along with smaller phrase technology. Any transcriptional errors that result from this process are unintentional.

## 2016-01-01 NOTE — Progress Notes (Signed)
Dr.Mungal notified of pt HR sustaining in the 130's. Dr. Dema Severin acknowledged. No new orders at this time.

## 2016-01-01 NOTE — Progress Notes (Signed)
Problem: Pt will maintain O2 saturation above 90% Outcome: Progressing  Pt remains on 3L [place] at this time. With coughing spells. Pt's sats remain at 90 or above for the greater part of my shift.  Problem: Pt will remain free from falls Outcome: Met.  Pt's bed alarm is on, personal alarm in on and operating correctly. Nursing staff made contact every 30 min to address any needs. Pt did not fall on my shift

## 2016-01-01 NOTE — Progress Notes (Signed)
Notified Dr Anne Hahn pt fell out of bed

## 2016-01-01 NOTE — Progress Notes (Signed)
Pt gave verbal permission for nursing staff to speak to sister Diane.  Pt set up password.  Pt sister Diane called and informed nursing staff that she has POA papers and was provided fax number to fax POA papers.  Will continue to assess.

## 2016-01-01 NOTE — Progress Notes (Signed)
eLink Physician-Brief Progress Note Patient Name: Larry Pacheco DOB: January 23, 1946 MRN: [id number]   Date of Service  01/01/2016  HPI/Events of Note  69 yo male with undocumented PMH, however, given medication COPD and HTN are likely. Initially admitted to floor with 5 day hx of severe cough and weakness. CXR reveals RML pneumonia. Apparently feel out of bed this morning >> transferred to ICU. Current medical regimen includes Norvasc, Rocephin, Azithromycin, Pulmicort Nebs, Lovenox [place], Hydrodiuril, Solumedrol, Xopenex Neb PRN  and Lopressor. BP = 126/86, HR = 134 (Sinus Tach), Sat on 2.0 L/min [place] O2 = 96% and RR = 28. Management per hospitalist service.   eICU Interventions  Continue present management.      Intervention Category Evaluation Type: New Patient Evaluation  Lenell Antu 01/01/2016, 2:15 AM

## 2016-01-01 NOTE — Progress Notes (Signed)
.   Nurse heard pt fall on to the floor. Bed alarm was on and alarmed when pt fell. Pt was found lying on floor, no apparent injuries. Vitals was taken before pt was put back to bed. MD and [facility] notified.

## 2016-01-01 NOTE — Progress Notes (Signed)
Dr. Dema Severin gave verbal approval for pt to be able to transport to CT without nurse.

## 2016-01-01 NOTE — Progress Notes (Signed)
Pharmacy Antibiotic Note  Larry Pacheco is a 70 y.o. male admitted on 01/08/2016 with pneumonia and sepsis.  Pharmacy has been consulted for Zosyn and vancomycin dosing.  Plan: 1. Zosyn 3.375 gm IV Q8H EI 2. Vancomycin 1 gm IV Q18H with stacked dosing, second dose approximately 10 hours after first, predicted trough 18 mcg/mL. Pharmacy will continue to monitor and adjust as needed to maintain trough 15 to 20 mcg/mL.  Vd 42.9 L, Ke 0.047 hr-1, T1/2 14.7 hr  Height:  (167.6 cm) Weight: 126 lb 2.6 oz (57.227 kg) IBW/kg (Calculated) : 63.8  Temp (24hrs), Avg:99.5 F (37.5 C), Min:97.9 F (36.6 C), Max:102.2 F (39 C)   Recent Labs Lab 01/04/2016 1501 01/09/2016 1555 01/08/2016 1836 12/31/15 0522 01/01/16 0207  WBC 7.3  --   --  8.0 12.3*  CREATININE 1.76*  --   --  0.97 1.14  LATICACIDVEN  --  2.7* 1.6  --   --     Estimated Creatinine Clearance: 48.1 mL/min (by C-G formula based on Cr of 1.14).    No Known Allergies   Thank you for allowing pharmacy to be a part of this patient's care.  Carola Frost, Pharm.D., BCPS Clinical Pharmacist 01/01/2016 4:03 AM

## 2016-01-01 NOTE — Care Management (Signed)
Patient was on 1A  after being admitted for pneumonia.  Began having elevated heart rate which did not respond to IV lopressor. He was heard falling to the floor around 2AM 2/15.  He was transferred to icu.  1A CM has set patient up with new PCP upon discharge.

## 2016-01-01 NOTE — Progress Notes (Signed)
MD called to confirm no head CT wanted after pt fall. No head CT at this time wanted.  Informed MD of pt slurred speech and agitation at times.  Bed alarm on, oriented to self and place.  Low B/P bolus given. MD informed of continued low B/P another bolus ordered and given, B/P improved.  MD informed of pts two BM's

## 2016-01-02 ENCOUNTER — Inpatient Hospital Stay: Payer: Medicare Other

## 2016-01-02 ENCOUNTER — Inpatient Hospital Stay (HOSPITAL_COMMUNITY)
Admit: 2016-01-02 | Discharge: 2016-01-02 | Disposition: A | Discharge status: Home / Self Care | Payer: Medicare Other | Attending: Specialist | Admitting: Specialist

## 2016-01-02 DIAGNOSIS — J9601 Acute respiratory failure with hypoxia: Secondary | ICD-10-CM

## 2016-01-02 DIAGNOSIS — A419 Sepsis, unspecified organism: Secondary | ICD-10-CM

## 2016-01-02 DIAGNOSIS — J9621 Acute and chronic respiratory failure with hypoxia: Secondary | ICD-10-CM

## 2016-01-02 DIAGNOSIS — J189 Pneumonia, unspecified organism: Secondary | ICD-10-CM | POA: Insufficient documentation

## 2016-01-02 DIAGNOSIS — R042 Hemoptysis: Secondary | ICD-10-CM

## 2016-01-02 DIAGNOSIS — R079 Chest pain, unspecified: Secondary | ICD-10-CM

## 2016-01-02 LAB — HEPATITIS B CORE ANTIBODY, TOTAL: HEP B C TOTAL AB: NEGATIVE

## 2016-01-02 LAB — BLOOD GAS, ARTERIAL
ACID-BASE EXCESS: 8.7 mmol/L — AB (ref 0.0–3.0)
Allens test (pass/fail): POSITIVE — AB
BICARBONATE: 33.5 meq/L — AB (ref 21.0–28.0)
FIO2: 1
MECHVT: 390 mL
O2 SAT: 87.5 %
PATIENT TEMPERATURE: 37
PCO2 ART: 46 mmHg (ref 32.0–48.0)
PEEP/CPAP: 8 cmH2O
PH ART: 7.47 — AB (ref 7.350–7.450)
RATE: 15 resp/min
pO2, Arterial: 50 mmHg — ABNORMAL LOW (ref 83.0–108.0)

## 2016-01-02 LAB — BLOOD CULTURE ID PANEL (REFLEXED)
Acinetobacter baumannii: NOT DETECTED
CANDIDA KRUSEI: NOT DETECTED
CARBAPENEM RESISTANCE: NOT DETECTED
Candida albicans: NOT DETECTED
Candida glabrata: NOT DETECTED
Candida parapsilosis: NOT DETECTED
Candida tropicalis: NOT DETECTED
ENTEROCOCCUS SPECIES: NOT DETECTED
ESCHERICHIA COLI: NOT DETECTED
Enterobacter cloacae complex: NOT DETECTED
Enterobacteriaceae species: NOT DETECTED
Haemophilus influenzae: NOT DETECTED
Klebsiella oxytoca: NOT DETECTED
Klebsiella pneumoniae: NOT DETECTED
LISTERIA MONOCYTOGENES: NOT DETECTED
METHICILLIN RESISTANCE: NOT DETECTED
NEISSERIA MENINGITIDIS: NOT DETECTED
PSEUDOMONAS AERUGINOSA: NOT DETECTED
Proteus species: NOT DETECTED
SERRATIA MARCESCENS: NOT DETECTED
STAPHYLOCOCCUS AUREUS BCID: NOT DETECTED
STAPHYLOCOCCUS SPECIES: NOT DETECTED
STREPTOCOCCUS SPECIES: NOT DETECTED
Streptococcus agalactiae: NOT DETECTED
Streptococcus pneumoniae: NOT DETECTED
Streptococcus pyogenes: NOT DETECTED
VANCOMYCIN RESISTANCE: NOT DETECTED

## 2016-01-02 LAB — ANA COMPREHENSIVE PANEL
Anti JO-1: <0.2 AI (ref 0.0–0.9)
Chromatin Ab SerPl-aCnc: <0.2 AI (ref 0.0–0.9)
SSA (Ro) (ENA) Antibody, IgG: <0.2 AI (ref 0.0–0.9)
Scleroderma (Scl-70) (ENA) Antibody, IgG: <0.2 AI (ref 0.0–0.9)

## 2016-01-02 LAB — MPO/PR-3 (ANCA) ANTIBODIES

## 2016-01-02 LAB — HEPATITIS C ANTIBODY: HCV AB: 1.6 {s_co_ratio} — AB (ref 0.0–0.9)

## 2016-01-02 MED ORDER — SODIUM CHLORIDE 0.9 % IV BOLUS (SEPSIS)
1000.0000 mL | Freq: Once | INTRAVENOUS | Status: AC
  Administered 2016-01-02: 1000 mL via INTRAVENOUS

## 2016-01-02 MED ORDER — FENTANYL CITRATE (PF) 100 MCG/2ML IJ SOLN
200.0000 ug | Freq: Once | INTRAMUSCULAR | Status: AC
  Administered 2016-01-02: 200 ug via INTRAVENOUS

## 2016-01-02 MED ORDER — MIDAZOLAM HCL 2 MG/2ML IJ SOLN
1.0000 mg | INTRAMUSCULAR | Status: DC | PRN
  Filled 2016-01-02: qty 2

## 2016-01-02 MED ORDER — MIDAZOLAM HCL 5 MG/5ML IJ SOLN
4.0000 mg | Freq: Once | INTRAMUSCULAR | Status: AC
  Administered 2016-01-02: 4 mg via INTRAVENOUS

## 2016-01-02 MED ORDER — FENTANYL CITRATE (PF) 100 MCG/2ML IJ SOLN: 50.0000 ug | Freq: Once | INTRAMUSCULAR | Status: AC

## 2016-01-02 MED ORDER — MIDAZOLAM HCL 2 MG/2ML IJ SOLN
INTRAMUSCULAR | Status: AC
  Administered 2016-01-02: 4 mg via INTRAVENOUS
  Filled 2016-01-02: qty 4

## 2016-01-02 MED ORDER — FENTANYL CITRATE (PF) 100 MCG/2ML IJ SOLN
INTRAMUSCULAR | Status: AC
  Administered 2016-01-02: 200 ug via INTRAVENOUS
  Filled 2016-01-02: qty 4

## 2016-01-02 MED ORDER — MIDAZOLAM HCL 2 MG/2ML IJ SOLN
1.0000 mg | INTRAMUSCULAR | Status: DC | PRN
  Administered 2016-01-02 – 2016-01-12 (×13): 1 mg via INTRAVENOUS
  Filled 2016-01-02 (×12): qty 2

## 2016-01-02 MED ORDER — FENTANYL 2500MCG IN NS 250ML (10MCG/ML) PREMIX INFUSION
25.0000 ug/h | INTRAVENOUS | Status: DC
  Administered 2016-01-02: 25 ug/h via INTRAVENOUS
  Administered 2016-01-02: 200 ug/h via INTRAVENOUS
  Filled 2016-01-02 (×2): qty 250

## 2016-01-02 MED ORDER — DEXTROSE 5 % IV SOLN
500.0000 mg | INTRAVENOUS | Status: DC
  Administered 2016-01-03 – 2016-01-04 (×2): 500 mg via INTRAVENOUS
  Filled 2016-01-02 (×4): qty 500

## 2016-01-02 MED ORDER — VECURONIUM BROMIDE 10 MG IV SOLR
10.0000 mg | Freq: Once | INTRAVENOUS | Status: AC
  Administered 2016-01-02: 10 mg via INTRAVENOUS
  Filled 2016-01-02: qty 10

## 2016-01-02 MED ORDER — DOCUSATE SODIUM 50 MG/5ML PO LIQD
100.0000 mg | Freq: Two times a day (BID) | ORAL | Status: DC | PRN
  Administered 2016-01-08: 100 mg
  Filled 2016-01-02: qty 10

## 2016-01-02 MED ORDER — ANTISEPTIC ORAL RINSE SOLUTION (CORINZ)
7.0000 mL | Freq: Four times a day (QID) | OROMUCOSAL | Status: DC
  Administered 2016-01-02 – 2016-01-03 (×3): 7 mL via OROMUCOSAL
  Filled 2016-01-02 (×7): qty 7

## 2016-01-02 MED ORDER — FENTANYL BOLUS VIA INFUSION
25.0000 ug | INTRAVENOUS | Status: DC | PRN
  Filled 2016-01-02: qty 25

## 2016-01-02 MED ORDER — CHLORHEXIDINE GLUCONATE 0.12% ORAL RINSE (MEDLINE KIT)
15.0000 mL | Freq: Two times a day (BID) | OROMUCOSAL | Status: DC
  Administered 2016-01-02: 15 mL via OROMUCOSAL
  Filled 2016-01-02 (×4): qty 15

## 2016-01-02 NOTE — Progress Notes (Signed)
Pharmacy Antibiotic Note  Larry Pacheco is a 70 y.o. male admitted on 12/24/2015 with pneumonia and sepsis.  Pharmacy has been consulted for Zosyn dosing. MD adding azithromycin for atypical coverage.   Plan: Will continue patient on Zosyn 3.375 gm IV Q8H EI.   GPC growing in 1/4 blood culture bottles. Biofire shows nothing detected. After discussion with Dr. Dema Severin, will continue Zosyn.   Height:  (167.6 cm) Weight: 117 lb 15.1 oz (53.5 kg) IBW/kg (Calculated) : 63.8  Temp (24hrs), Avg:98.8 F (37.1 C), Min:98.4 F (36.9 C), Max:99.5 F (37.5 C)   Recent Labs Lab 12/27/2015 1501 01/03/2016 1555 01/01/2016 1836 12/31/15 0522 01/01/16 0207  WBC 7.3  --   --  8.0 12.3*  CREATININE 1.76*  --   --  0.97 1.14  LATICACIDVEN  --  2.7* 1.6  --   --     Estimated Creatinine Clearance: 45 mL/min (by C-G formula based on Cr of 1.14).    No Known Allergies   Antibiotics:  Ceftriaxone 2/13 >>2/14 Azithromycin 2/13 >>2/14 Vanc 2/15>>2/15 Zosyn 2/15>> Azithromycin 02/16 >>  Microbiology:  02/13 Flu: negative 02/15 MRSA PCR: negative 02/13 Urine cx: negative 02/15 GI panel: negative 02/15 C diff: negative 02/15 Sputum cx: pending 02/15 GPC in 1/4 bottles  Pharmacy will continue to monitor and adjust per consult.  Luisa Hart D, Pharm.D. 01/02/2016 2:19 PM

## 2016-01-02 NOTE — Progress Notes (Signed)
eLink Physician-Brief Progress Note Patient Name: Larry Pacheco DOB: 1946/01/11 MRN: [id number]   Date of Service  01/02/2016  HPI/Events of Note  Multiple issues No gastric access Night time metoprolol scheduled Nurse requesting foley  eICU Interventions  OG tube Hold metoprolol given soft BP Condom cath     Intervention Category Intermediate Interventions: Hypotension - evaluation and management Minor Interventions: Routine modifications to care plan (e.g. PRN medications for pain, fever)  Max Fickle 01/02/2016, 9:19 PM

## 2016-01-02 NOTE — Procedures (Signed)
Procedure Note:  Orotracheal Intubation  Implied consent due to emergent nature of patient's condition. Correct Patient, Name & ID confirmed.  The patient was pre-oxygenated and then, under direct visualization, a 8.0 mm cuffed endotracheal tube was placed through the vocal cords into the trachea, using the Glidescope.  Total attempts made 1, using Glidescope. Anterior VC, mild to moderate thick secretions noted.  During intubation an assistant applied gentle pressure to the cricoid cartilage.  Position confirmed by auscultation of lungs (good breath sounds bilaterally) and no stomach sounds.  Tube secured at 23 cm. Pulse ox 97 %. CO2 detector in place with appropriate color change.   Pt tolerated procedure well.  No complications were noted.   CXR ordered.   Stephanie Acre, MD [facility] Pager [phone] On Call Pager [phone]

## 2016-01-02 NOTE — Progress Notes (Signed)
[facility] [place] [facility]   PATIENT NAME: Larry Pacheco    MR#:  [id number]  DATE OF BIRTH:  [birth year]  SUBJECTIVE:  Still tachycardic and confused. Had to place on HFNC due to worsening. SOB worse  REVIEW OF SYSTEMS:    Review of Systems  Constitutional: Negative for fever and chills.  HENT: Negative for congestion and tinnitus.   Eyes: Negative for blurred vision and double vision.  Respiratory: Positive for cough and shortness of breath. Negative for wheezing.   Cardiovascular: Negative for chest pain, orthopnea and PND.  Gastrointestinal: Negative for nausea, vomiting, abdominal pain and diarrhea.  Genitourinary: Negative for dysuria and hematuria.  Neurological: Positive for weakness ( generalized). Negative for dizziness, sensory change and focal weakness.  All other systems reviewed and are negative.  DRUG ALLERGIES:  No Known Allergies  VITALS:  Blood pressure 146/90, pulse 123, temperature 99.5 F (37.5 C), temperature source Oral, resp. rate 35, height  (1.676 m), weight 53.5 kg (117 lb 15.1 oz), SpO2 93 %.  PHYSICAL EXAMINATION:   Physical Exam  GENERAL:  70 y.o.-year-old patient lying in the bed in respiratory distress. EYES: Pupils equal, round, reactive to light and accommodation. No scleral icterus. Extraocular muscles intact.  HEENT: Head atraumatic, normocephalic. Oropharynx and nasopharynx clear.  NECK:  Supple, no jugular venous distention. No thyroid enlargement, no tenderness.  LUNGS: Diffuse rhonchi, rales. Good air entry bilaterally. use of accessory muscles of respiration.  CARDIOVASCULAR: S1, S2 normal. No murmurs, rubs, or gallops.  ABDOMEN: Soft, nontender, nondistended. Bowel sounds present. No organomegaly or mass.  EXTREMITIES: No cyanosis, clubbing or edema b/l.    NEUROLOGIC: Cranial nerves II through XII are intact. No focal Motor or sensory deficits b/l.   PSYCHIATRIC: The patient is alert and  awake SKIN: No obvious rash, lesion, or ulcer.    LABORATORY PANEL:   CBC  Recent Labs Lab 01/01/16 0207  WBC 12.3*  HGB 13.9  HCT 40.5  PLT 203   ------------------------------------------------------------------------------------------------------------------  Chemistries   Recent Labs Lab 12/31/2015 1501  01/01/16 0207  NA 133*  < > 134*  K 3.8  < > 3.8  CL 92*  < > 103  CO2 31  < > 21*  GLUCOSE 139*  < > 157*  BUN 49*  < > 28*  CREATININE 1.76*  < > 1.14  CALCIUM 8.4*  < > 7.1*  AST 85*  --   --   ALT 41  --   --   ALKPHOS 58  --   --   BILITOT 0.6  --   --   < > = values in this interval not displayed. ------------------------------------------------------------------------------------------------------------------  Cardiac Enzymes  Recent Labs Lab 12/31/15 0522  TROPONINI 0.09*   ------------------------------------------------------------------------------------------------------------------  RADIOLOGY:  Dg Chest 1 View  01/01/2016  CLINICAL DATA:  Patient's coughing up blood tonight. Shortness of breath. EXAM: CHEST 1 VIEW COMPARISON:  12/27/2015 FINDINGS: Borderline heart size with normal pulmonary vascularity for technique. Suggestion of patchy developing airspace disease in the right mid lung could represent pneumonia or hemorrhage. No blunting of costophrenic angles. No pneumothorax. IMPRESSION: Developing focal infiltration in the right mid lung. Electronically Signed   By: Burman Nieves M.D.   On: 01/01/2016 01:56   Ct Head Wo Contrast  01/01/2016  CLINICAL DATA:  Fall out of bed in the hospital.  Confusion. EXAM: CT HEAD WITHOUT CONTRAST TECHNIQUE: Contiguous axial images were obtained from the base of the skull through  the vertex without intravenous contrast. COMPARISON:  None. FINDINGS: There is atrophy and chronic small vessel disease changes. No acute intracranial abnormality. Specifically, no hemorrhage, hydrocephalus, mass lesion, acute  infarction, or significant intracranial injury. No acute calvarial abnormality. Posterior mucosal thickening versus air-fluid level in the right maxillary sinus. Remainder the paranasal sinuses and mastoid air cells are clear. IMPRESSION: No acute intracranial abnormality. Atrophy, chronic microvascular disease. Electronically Signed   By: Charlett Nose M.D.   On: 01/01/2016 10:30     ASSESSMENT AND PLAN:   70 year old male with no significant past medical history presents to the hospital with shortness of breath cough and congestion and noted to be in acute respiratory failure with hypoxia.  # Acute respiratory failure with hypoxia - worsening and now on HFNC Atypical pneumonia (vs) interstitial lung disease as seen on the CT chest. -Continue IV antibiotics with Zosyn. Vancomycin stopped -- IV steroids, DuoNeb nebs around-the-clock, Pulmicort nebs. - Discussed with Dr. Dema Severin May need intubation later. Bronch when stable  # Essential HTN - no previous hx of HTN.  - cont. HCTZ, Norvasc.  # Elevated Troponin - likely in the setting of demand ischemia from hypoxia.  - no chest pain. Trop. Did not trend upwards.  - 2-D Echo pending  # Agitation/AMS - etiology unclear.  - Likelt metabolic from acute illness PRN Haldol CT head (-).   #. Diarrhea  - resolved now. Stool PCR (-).     All the records are reviewed and case discussed with Care Management/Social Workerr. Management plans discussed with the patient, family and they are in agreement.  CODE STATUS: Full  DVT Prophylaxis: Lovenox  TOTAL CC TIME TAKING CARE OF THIS PATIENT: 35 minutes.    Milagros Loll R M.D on 01/02/2016 at 9:49 AM  Between 7am to 6pm - Pager - [phone]  After 6pm go to [email or web address] - password [email or web address] [facility]  [place] [facility]  Office  [phone]  CC: Primary care physician; No primary care provider on file.

## 2016-01-02 NOTE — Care Management (Signed)
Patient now on high flow nasal cannula.  Bronch and intubation planned for later today.  Hemoptysis from pneumonia

## 2016-01-02 NOTE — Progress Notes (Signed)
Update: Patient with multifocal PNA, now episodes of hemoptysis, however none since lastnight.  Toady on HFNC, unable to wean and becoming more labored. Patient with intermittent episodes of confusion and dropping sats intermittently when talking to the mid 80s on HFNC, 50L 70% Spoke with sister, given patient tenuous status, decision made to intubate patient and proceed with bronchoscopy.     Stephanie Acre, MD [place] Pager [phone] (please enter 7-digits) On Call Pager - [phone] (please enter 7-digits)

## 2016-01-02 NOTE — Procedures (Addendum)
Date: 01/02/2016,     PHYSICIAN:  Atiba Kimberlin  Indications/Preliminary Diagnosis: Hypoxia, pneumonitis, multilobar pneumonia  Consent: (Place X beside choice/s below)  The benefits, risks and possible complications of the procedure were        explained to:  _x__ patient  _x__ patient's family  ___ other:___________  who verbalized understanding and gave:  _x__ verbal  ___ written  ___ verbal and written  __x_ telephone  ___ other:________ consent.      Unable to obtain consent; procedure performed on emergent basis.     Other:       PRESEDATION ASSESSMENT: History and Physical has been performed. Patient meds and allergies have been reviewed. Presedation airway examination has been performed and documented. Baseline vital signs, sedation score, oxygenation status, and cardiac rhythm were reviewed. Patient was deemed to be in satisfactory condition to undergo the procedure.  PREMEDICATIONS:   Sedative/Narcotic Amt Dose   Versed  mg   Fentanyl  mcg  Diprivan  mg        Airway Prep (Place X beside choice below)   1% Transtracheal Lidocaine Anesthetization 7 cc   Patient prepped per Bronchoscopy Lab Policy       Insertion Route (Place X beside choice below)   Nasal   Oral   Endotracheal Tube   Tracheostomy   INTRAPROCEDURE MEDICATIONS:  Sedative/Narcotic Amt Dose   Versed  mg   Fentanyl  mcg  Diprivan  mg       Medication Amt Dose  Medication Amt Dose  Xylocaine 2%  cc  Epinephrine 1:10,000 sol  cc  Xylocaine 4%  cc  Cocaine  cc   TECHNICAL PROCEDURES: (Place X beside choice below)   Procedures  Description    None     Electrocautery     Cryotherapy     Balloon Dilatation     Bronchography     Stent Placement   x  Therapeutic Aspiration     Laser/Argon Plasma    Brachytherapy Catheter Placement    Foreign Body Removal     SPECIMENS (Sites): (Place X beside choice below)  Specimens Description   No Specimens Obtained     Washings   x Lavage LUL  - Lingula   Biopsies    Fine Needle Aspirates    Brushings    Sputum    FINDINGS: See below  ESTIMATED BLOOD LOSS: None  COMPLICATIONS/RESOLUTION: None  PROCEDURE DETAILS: Timeout performed and correct patient, name, & ID confirmed. Following prep per Pulmonary policy, appropriate sedation was administered.  Airway exam proceeded with findings, technical procedures, and specimen collection as noted below. At the end of exam the scope was withdrawn without incident. Impression and Plan as noted below.   Inspection: Right airway examination-Normal anatomy, mild erythema of the mucosa in the right mainstem and are twice a day, along with erythema of the mucosa to once the right lower lobe. There are no endobronchial lesions no anatomical variations noted in the right side. No blood streaks or blood clots noted in the airway.   Left airway examination-normal anatomy, no variations. There is moderate to severe erythema and inflammatory appearance of the mucosa in the left mainstem lingula and left lower lobe, there are no endobronchial lesions. Mild thick left mainstem secretions easily suctioned. No blood streaks or blood clots noted in the airway.  Procedure: BAL - LUL, lingula, 40ml instilled, 20 ml returned, mild cloudy appearance, no clots in BAL fluid  IMPLANTED DEVICE(S):none  IMPRESSION:POST-PROCEDURE DX: pneumonia, pneumonitis  RECOMMENDATION/PLAN:  Follow up cultures   ADDITIONAL COMMENTS: none  Procedure Time:15 min   Stephanie Acre, MD [place] [facility] Pager : [phone] (Please enter 7 digits) On call pager [phone] (please enter 7-digits) Clinic - [phone]

## 2016-01-02 NOTE — Progress Notes (Signed)
*  PRELIMINARY RESULTS* Echocardiogram 2D Echocardiogram has been performed.  Georgann Housekeeper Hege 01/02/2016, 11:46 AM

## 2016-01-02 NOTE — Progress Notes (Signed)
Update: -Patient status post bronchoscopy, BAL of lingula lobe sent. Studies ordered, microbiology, viral, fungal, AFB. -No blood streaks of blood clots noted during inspection or in BAL.  Recs - follow up BAL results  - no sure patient actually had hemoptysis, he is hemoccult positive, consider CT abd/pelvis and GI evaluation.  Stephanie Acre, MD [facility] Pager [phone] (please enter 7-digits) On Call Pager - [phone] (please enter 7-digits)

## 2016-01-02 NOTE — Progress Notes (Signed)
VSS, pt rested throughout night, bed changed x2 due to pt BM.

## 2016-01-02 NOTE — Clinical Social Work Note (Signed)
Additional note: patient's sister stated that patient's cats are being cared for by a neighbor and that she has made arrangements for this. York Spaniel MSW,LCSW [phone]

## 2016-01-02 NOTE — Clinical Social Work Note (Signed)
Clinical Social Work Assessment  Patient Details  Name: Larry Pacheco MRN: [id number] Date of Birth: December 18, 1945  Date of referral:  01/02/16               Reason for consult:  Discharge Planning                Permission sought to share information with:    Permission granted to share information::     Name::        Agency::     Relationship::     Contact Information:     Housing/Transportation Living arrangements for the past 2 months:  Single Family Home Source of Information:  Other (Comment Required) (sibling) Patient Interpreter Needed:  None Criminal Activity/Legal Involvement Pertinent to Current Situation/Hospitalization:  No - Comment as needed Significant Relationships:  Siblings Lives with:  Self Do you feel safe going back to the place where you live?    Need for family participation in patient care:     Care giving concerns:  Patient lives alone.   Social Worker assessment / plan:  Patient's sister: Diane: [phone] requested CSW consult due to concerns regarding patient and his living situation. Diane resides in [place]. Patient has been intubated as of today. 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 She did not say that she felt the environment was uninhabitable. She stated that he is eccentric and will more than likely not change the way he lives as he has always lived this way. She states that he does not drive and rides a bike throughout town. She states that he has stayed in fairly good health as a result. She states he attends church every Sunday but is typically a recluse. Her main reason for wanting to speak with CSW was to inquire about Meals on Wheels and to inquire about transportation. CSW discussed that the RN CM will be speaking with her as well and can discuss more about Meals  on Wheels. CSW informed patient's sister that there is medicaid transportation but patient will need to apply through medicaid transportation first and CSW has provided her this information and contact numbers. CSW also discussed that we will have to see how patient progresses and that it could be possible that he may need rehab prior to returning home but that we will revisit that when time. CSW briefly touched on LTAC level of care as well but if necessary, the RN CM will pursue that further. CSW will continue to follow.  Employment status:  Retired Health and safety inspector:  Armed forces operational officer, Medicaid In [place] PT Recommendations:    Information / Referral to community resources:     Patient/Family's Response to care:  Patient's sister very Adult nurse of CSW assistance.  Patient/Family's Understanding of and Emotional Response to Diagnosis, Current Treatment, and Prognosis:  Patient's sister cares very much for her brother and wants to ensure he is taken care of once he is discharged.  Emotional Assessment Appearance:  Appears stated age Attitude/Demeanor/Rapport:   (on ventilator) Affect (typically observed):   (on ventilator) Orientation:  Fluctuating Orientation (Suspected and/or reported Sundowners) Alcohol / Substance use:  Not Applicable Psych involvement (Current and /or in the community):  No (Comment)  Discharge Needs  Concerns to be addressed:  Care Coordination Readmission within the last 30 days:  No Current discharge  risk:  None Barriers to Discharge:  No Barriers Identified   York Spaniel, LCSW 01/02/2016, 3:08 PM

## 2016-01-02 NOTE — Progress Notes (Signed)
eLink Physician-Brief Progress Note Patient Name: Larry Pacheco DOB: August 23, 1946 MRN: [id number]   Date of Service  01/02/2016  HPI/Events of Note  Hypotension post intubation CXR post CVL is fine  eICU Interventions  Bolus another liter saline Check CVP     Intervention Category Intermediate Interventions: Hypotension - evaluation and management  Max Fickle 01/02/2016, 4:38 PM

## 2016-01-02 NOTE — Progress Notes (Signed)
Pt remains stable at this time. Report given to oncoming nurse. Pt remains on ventilator and responding to voice.

## 2016-01-02 NOTE — Procedures (Signed)
  Procedure Note: Central Venous Catheter Placement - RIJ Elie [place] , [id number] , IC20A/IC20A-AA  Indications: Hemodynamic monitoring / Intravenous access  Benefits, risks (including bleeding, infection,  Injury, etc.), and alternatives explained to patient and sister who voiced understanding.  Questions were sought and answered.   Patient and sister agreed to proceed with the procedure.  Consent is signed and on chart. A time-out was completed verifying correct patient, procedure and site.  A 3 lumen catheter available at the time of procedure.  The patient was placed in a dependent position appropriate for central line placement based on the vein to be cannulated.   The patient's RIGHT/LEFT Internal Jugular Vein/Femoral Vein was prepped and draped in a sterile fashion.  1% Lidocaine WAS NOT used to anesthetize the surrounding skin area.   A 3 lumen catheter was introduced into the RIGHT Internal Jugular Vein using Seldinger technique, visualized under ultrasound.  The catheter was threaded smoothly over the guide wire and appropriate blood return was obtained.  Each lumen of the catheter was evacuated of air and flushed with sterile saline.  The catheter was then sutured in place to the skin and a sterile dressing applied.  Perfusion to the extremity distal to the point of catheter insertion was checked and found to be adequate.    Chest X-ray was ordered for confirmation of placement.  The patient tolerated the procedure well and there were no complications.  Stephanie Acre, MD [facility] Pager [phone] (please enter 7-digits) On Call Pager - [phone] (please enter 7-digits)

## 2016-01-02 NOTE — Progress Notes (Addendum)
PULMONARY / CRITICAL CARE MEDICINE   Name: Larry Pacheco MRN: [id number] DOB: 23-Sep-1946    ADMISSION DATE:  01/01/16  BRIEF HISTORY: 70 year old male with no stomach and past medical history, admitted on 12/30/2011 for shortness of breath, and intermittent hemoptysis, started experiencing shortness of hemoptysis along with cough about one week ago after going out to the [place] to bird watch. Nonsmoker, states he is in good health otherwise. CTA chest negative for pulmonary embolus. Acute decline in respiratory status from nasal cannula to high flow oxygen to requiring intubation.  SUBJECTIVE:  Overnight had moderate amounts of hemoptysis, placed on high flow nasal cannula due to significant desaturations, today seems to be in mild respiratory distress but is mentating well, difficult to verbalize due to dyspnea.  STUDIES:  CTA chest 2/12>> no PE, multilobar basilar pneumonia, mildly tight findings, pneumonitis  SIGNIFICANT EVENTS: 2/16>> worsening hypoxia requiring high flow nasal cannula, unable to wean, subsequently intubated  VITAL SIGNS: Temp:  [98.4 F (36.9 C)-99.5 F (37.5 C)] 99.5 F (37.5 C) (02/16 0800) Pulse Rate:  [104-152] 127 (02/16 1131) Resp:  [20-51] 35 (02/16 0800) BP: (127-150)/(71-96) 142/78 mmHg (02/16 1131) SpO2:  [85 %-99 %] 93 % (02/16 0815) FiO2 (%):  [88 %-95 %] 88 % (02/16 0815) Weight:  [117 lb 15.1 oz (53.5 kg)] 117 lb 15.1 oz (53.5 kg) (02/16 0500) HEMODYNAMICS:   VENTILATOR SETTINGS: Vent Mode:  [-]  FiO2 (%):  [88 %-95 %] 88 % INTAKE / OUTPUT:  Intake/Output Summary (Last 24 hours) at 01/02/16 1455 Last data filed at 01/02/16 0400  Gross per 24 hour  Intake    340 ml  Output    300 ml  Net     40 ml    Review of Systems  Constitutional: Negative for fever and chills.  HENT: Positive for sore throat.   Eyes: Negative for blurred vision and double vision.  Respiratory: Positive for cough, hemoptysis, sputum production and shortness of  breath.   Cardiovascular: Negative for chest pain and palpitations.  Gastrointestinal: Negative for heartburn and nausea.  Genitourinary: Negative for dysuria.  Musculoskeletal: Negative for myalgias.  Endo/Heme/Allergies: Does not bruise/bleed easily.    Physical Exam  Constitutional: He is well-developed, well-nourished, and in no distress.  HENT:  Head: Normocephalic.  Right Ear: External ear normal.  Left Ear: External ear normal.  Nose: Nose normal.  Mouth/Throat: Oropharynx is clear and moist.  Eyes: Pupils are equal, round, and reactive to light.  Neck: Normal range of motion. Neck supple. No thyromegaly present.  Cardiovascular: Regular rhythm, normal heart sounds and intact distal pulses.   No murmur heard. Pulmonary/Chest: He is in respiratory distress. He has no wheezes. He has no rales. He exhibits no tenderness.  Wearing high for nasal cannula, noted to desaturating to the mid 80s with verbalization, mild use of accessory muscles, coarse upper airway sounds  Abdominal: Soft. Bowel sounds are normal. He exhibits no distension. There is no tenderness. There is no rebound.  Musculoskeletal: Normal range of motion. He exhibits no edema or tenderness.  Neurological: He is alert.  Skin: Skin is warm and dry. He is not diaphoretic.  Nursing note and vitals reviewed.    LABS:  CBC  Recent Labs Lab 01-01-16 1501 12/31/15 0522 01/01/16 0207  WBC 7.3 8.0 12.3*  HGB 18.0 15.6 13.9  HCT 53.0* 44.9 40.5  PLT 196 180 203   Coag's  Recent Labs Lab January 01, 2016 1501  INR 1.01   BMET  Recent Labs Lab  01/07/2016 1501 12/31/15 0522 01/01/16 0207  NA 133* 139 134*  K 3.8 3.8 3.8  CL 92* 109 103  CO2 31 24 21*  BUN 49* 29* 28*  CREATININE 1.76* 0.97 1.14  GLUCOSE 139* 163* 157*   Electrolytes  Recent Labs Lab 01/03/2016 1501 12/31/15 0522 01/01/16 0207  CALCIUM 8.4* 7.0* 7.1*   Sepsis Markers  Recent Labs Lab 01/10/2016 1555 12/31/2015 1836  LATICACIDVEN  2.7* 1.6   ABG No results for input(s): PHART, PCO2ART, PO2ART in the last 168 hours. Liver Enzymes  Recent Labs Lab 01/01/2016 1501  AST 85*  ALT 41  ALKPHOS 58  BILITOT 0.6  ALBUMIN 3.4*   Cardiac Enzymes  Recent Labs Lab 01/10/2016 1836 12/25/2015 1958 12/31/15 0522  TROPONINI 0.06* 0.05* 0.09*   Glucose  Recent Labs Lab 01/01/16 0157  GLUCAP 174*    Imaging No results found.  Cultures: BCx2 2/13>> UC  Sputum 2/13>> Influenza negative.  Cdiff negative Hemoccult POSITIVE  Antibiotics: Zosyn 2/15>> Vanc 2/15>> Lines:  ASSESSMENT / PLAN: 70 year old male no significant past medical history now with multifocal pneumonia, cough, dyspnea, accidental fall, requiring supplemental oxygen, chest CT concerning for interstitial lung disease versus multifocal pneumonia  PULMONARY Bilateral basilar pneumonia/multifocal pneumonia Hypoxia Dyspnea Hempotysis P:  -CT chest from 12/20/2015 reviewed, there is extensive groundglass opacity and reticulation in both lungs no prior studies to compare with, differentials include interstitial lung disease versus bilateral atypical pneumonia. -impending respiratory failure - if unable to wean FiO2 or worsening clinical decline, will plan for intubation and bronchoscopy with BAL -Continue with steroids and antibiotics for now -Could be a hypersensitivity type reaction given his acute onset after leaving the [facility] -Continue with supportive care -Keep O2 saturations greater than 88% -May supplemental oxygen  CARDIOVASCULAR Elevated troponin Hypertension P:  Continued monitoring Continue with home meds MAP>65  RENAL ICU electrolyte replacement protocol  GASTROINTESTINAL Proton pump inhibitor for stress ulcer prophylaxis  HEMATOLOGIC Monitor CBC  INFECTIOUS Possible multifocal/bilateral pneumonia Continue current antibiotics Plan for bronchoscopy within the next 24 hours with BAL   ENDOCRINE ICU  hypo/hyperglycemia protocol  NEUROLOGIC Accidental fall Confusion - improving P:  RASS goal: 0 CT head negative for acute hemorrhage    Thank you for consulting [facility], Please feel free to contacts [place] with any questions at [phone] (please enter 7-digits).  I have personally obtained a history, examined the patient, evaluated laboratory and imaging results, formulated the assessment and plan and placed orders.  The Patient requires high complexity decision making for assessment and support, frequent evaluation and titration of therapies, application of advanced monitoring technologies and extensive interpretation of multiple databases. Critical Care Time devoted to patient care services described in this note is 45 minutes.   Overall, patient is critically ill, prognosis is guarded. Patient at high risk for cardiac arrest and death.    Stephanie Acre, MD [facility] Pager [phone] (please enter 7-digits) On Call Pager - [phone] (please enter 7-digits)  Note: This note was prepared with Dragon dictation along with smaller phrase technology. Any transcriptional errors that result from this process are unintentional.

## 2016-01-03 ENCOUNTER — Encounter: Payer: Self-pay | Admitting: Radiology

## 2016-01-03 ENCOUNTER — Inpatient Hospital Stay: Payer: Medicare Other

## 2016-01-03 LAB — POTASSIUM: Potassium: 3.3 mmol/L — ABNORMAL LOW (ref 3.5–5.1)

## 2016-01-03 LAB — COMPREHENSIVE METABOLIC PANEL WITH GFR
ALT: 101 U/L — ABNORMAL HIGH (ref 17–63)
AST: 122 U/L — ABNORMAL HIGH (ref 15–41)
Albumin: 2.1 g/dL — ABNORMAL LOW (ref 3.5–5.0)
Alkaline Phosphatase: 47 U/L (ref 38–126)
Anion gap: 8 (ref 5–15)
BUN: 29 mg/dL — ABNORMAL HIGH (ref 6–20)
CO2: 30 mmol/L (ref 22–32)
Calcium: 7.1 mg/dL — ABNORMAL LOW (ref 8.9–10.3)
Chloride: 101 mmol/L (ref 101–111)
Creatinine, Ser: 1.24 mg/dL (ref 0.61–1.24)
GFR calc Af Amer: >60 mL/min
GFR calc non Af Amer: 57 mL/min — ABNORMAL LOW
Glucose, Bld: 190 mg/dL — ABNORMAL HIGH (ref 65–99)
Potassium: 3 mmol/L — ABNORMAL LOW (ref 3.5–5.1)
Sodium: 139 mmol/L (ref 135–145)
Total Bilirubin: 1.2 mg/dL (ref 0.3–1.2)
Total Protein: 5.4 g/dL — ABNORMAL LOW (ref 6.5–8.1)

## 2016-01-03 LAB — BLOOD GAS, ARTERIAL
Acid-Base Excess: 9.1 mmol/L — ABNORMAL HIGH (ref 0.0–3.0)
Allens test (pass/fail): POSITIVE — AB
Bicarbonate: 35.2 meq/L — ABNORMAL HIGH (ref 21.0–28.0)
FIO2: 0.6
O2 Saturation: 86.8 %
PEEP: 8 cmH2O
Patient temperature: 37
RATE: 390 {breaths}/min
pCO2 arterial: 53 mmHg — ABNORMAL HIGH (ref 32.0–48.0)
pH, Arterial: 7.43 (ref 7.350–7.450)
pO2, Arterial: 51 mmHg — ABNORMAL LOW (ref 83.0–108.0)

## 2016-01-03 LAB — CBC WITH DIFFERENTIAL/PLATELET
BASOS ABS: 0 10*3/uL (ref 0–0.1)
Basophils Relative: 0 %
EOS PCT: 0 %
Eosinophils Absolute: 0 10*3/uL (ref 0–0.7)
HCT: 30.8 % — ABNORMAL LOW (ref 40.0–52.0)
HEMOGLOBIN: 10.3 g/dL — AB (ref 13.0–18.0)
LYMPHS PCT: 2 %
Lymphs Abs: 0.3 10*3/uL — ABNORMAL LOW (ref 1.0–3.6)
MCH: 30.1 pg (ref 26.0–34.0)
MCHC: 33.6 g/dL (ref 32.0–36.0)
MCV: 89.6 fL (ref 80.0–100.0)
Monocytes Absolute: 0.6 10*3/uL (ref 0.2–1.0)
Monocytes Relative: 4 %
NEUTROS ABS: 14.3 10*3/uL — AB (ref 1.4–6.5)
NEUTROS PCT: 94 %
PLATELETS: 288 10*3/uL (ref 150–440)
RBC: 3.44 MIL/uL — AB (ref 4.40–5.90)
RDW: 13.9 % (ref 11.5–14.5)
WBC: 15.2 10*3/uL — AB (ref 3.8–10.6)

## 2016-01-03 LAB — GLUCOSE, CAPILLARY
GLUCOSE-CAPILLARY: 164 mg/dL — AB (ref 65–99)
GLUCOSE-CAPILLARY: 186 mg/dL — AB (ref 65–99)
Glucose-Capillary: 209 mg/dL — ABNORMAL HIGH (ref 65–99)

## 2016-01-03 LAB — PHOSPHORUS: PHOSPHORUS: 1.8 mg/dL — AB (ref 2.5–4.6)

## 2016-01-03 LAB — MAGNESIUM: Magnesium: 2.5 mg/dL — ABNORMAL HIGH (ref 1.7–2.4)

## 2016-01-03 MED ORDER — IOHEXOL 240 MG/ML SOLN: 25.0000 mL | INTRAMUSCULAR | Status: AC

## 2016-01-03 MED ORDER — POTASSIUM CHLORIDE 10 MEQ/100ML IV SOLN
10.0000 meq | INTRAVENOUS | Status: AC
  Administered 2016-01-03 (×6): 10 meq via INTRAVENOUS
  Filled 2016-01-03 (×6): qty 100

## 2016-01-03 MED ORDER — VITAL AF 1.2 CAL PO LIQD
1000.0000 mL | ORAL | Status: DC
  Administered 2016-01-03: 1000 mL

## 2016-01-03 MED ORDER — POTASSIUM CHLORIDE 10 MEQ/100ML IV SOLN
10.0000 meq | INTRAVENOUS | Status: DC
Start: 2016-01-04 — End: 2016-01-04
  Filled 2016-01-03 (×4): qty 100

## 2016-01-03 MED ORDER — POLYETHYLENE GLYCOL 3350 17 G PO PACK
17.0000 g | PACK | Freq: Every day | ORAL | Status: DC
Start: 2016-01-03 — End: 2016-01-03

## 2016-01-03 MED ORDER — IOHEXOL 300 MG/ML  SOLN
100.0000 mL | Freq: Once | INTRAMUSCULAR | Status: AC | PRN
  Administered 2016-01-03: 100 mL via INTRAVENOUS

## 2016-01-03 MED ORDER — DEXMEDETOMIDINE HCL IN NACL 400 MCG/100ML IV SOLN
0.0000 ug/kg/h | INTRAVENOUS | Status: DC
Start: 2016-01-03 — End: 2016-01-06
  Administered 2016-01-03: 0.4 ug/kg/h via INTRAVENOUS
  Administered 2016-01-03: 0.5 ug/kg/h via INTRAVENOUS
  Administered 2016-01-04: 1.2 ug/kg/h via INTRAVENOUS
  Filled 2016-01-03 (×3): qty 100

## 2016-01-03 MED ORDER — ANTISEPTIC ORAL RINSE SOLUTION (CORINZ)
7.0000 mL | Freq: Four times a day (QID) | OROMUCOSAL | Status: DC
  Administered 2016-01-03 – 2016-01-15 (×48): 7 mL via OROMUCOSAL
  Filled 2016-01-03 (×53): qty 7

## 2016-01-03 MED ORDER — FREE WATER
200.0000 mL | Freq: Three times a day (TID) | Status: DC
  Administered 2016-01-03 – 2016-01-15 (×33): 200 mL

## 2016-01-03 MED ORDER — INSULIN ASPART 100 UNIT/ML ~~LOC~~ SOLN
0.0000 [IU] | SUBCUTANEOUS | Status: DC
  Administered 2016-01-03 – 2016-01-04 (×2): 3 [IU] via SUBCUTANEOUS
  Administered 2016-01-04: 8 [IU] via SUBCUTANEOUS
  Administered 2016-01-04: 5 [IU] via SUBCUTANEOUS
  Administered 2016-01-04 – 2016-01-05 (×3): 3 [IU] via SUBCUTANEOUS
  Administered 2016-01-05 (×2): 8 [IU] via SUBCUTANEOUS
  Administered 2016-01-05 (×2): 5 [IU] via SUBCUTANEOUS
  Administered 2016-01-06 (×2): 2 [IU] via SUBCUTANEOUS
  Administered 2016-01-06: 3 [IU] via SUBCUTANEOUS
  Administered 2016-01-06: 2 [IU] via SUBCUTANEOUS
  Administered 2016-01-07 (×6): 3 [IU] via SUBCUTANEOUS
  Administered 2016-01-08 (×2): 2 [IU] via SUBCUTANEOUS
  Administered 2016-01-08: 3 [IU] via SUBCUTANEOUS
  Administered 2016-01-08: 5 [IU] via SUBCUTANEOUS
  Administered 2016-01-08: 2 [IU] via SUBCUTANEOUS
  Administered 2016-01-08: 3 [IU] via SUBCUTANEOUS
  Administered 2016-01-09 (×3): 2 [IU] via SUBCUTANEOUS
  Administered 2016-01-09 (×3): 3 [IU] via SUBCUTANEOUS
  Administered 2016-01-10: 2 [IU] via SUBCUTANEOUS
  Administered 2016-01-10 – 2016-01-11 (×6): 3 [IU] via SUBCUTANEOUS
  Administered 2016-01-11: 2 [IU] via SUBCUTANEOUS
  Administered 2016-01-11 – 2016-01-12 (×2): 3 [IU] via SUBCUTANEOUS
  Administered 2016-01-12 – 2016-01-13 (×4): 2 [IU] via SUBCUTANEOUS
  Administered 2016-01-13: 5 [IU] via SUBCUTANEOUS
  Administered 2016-01-14: 2 [IU] via SUBCUTANEOUS
  Administered 2016-01-14 (×2): 3 [IU] via SUBCUTANEOUS
  Administered 2016-01-14 – 2016-01-15 (×2): 2 [IU] via SUBCUTANEOUS
  Filled 2016-01-03 (×2): qty 2
  Filled 2016-01-03 (×2): qty 3
  Filled 2016-01-03: qty 2
  Filled 2016-01-03 (×2): qty 8
  Filled 2016-01-03: qty 5
  Filled 2016-01-03: qty 2
  Filled 2016-01-03 (×2): qty 3
  Filled 2016-01-03: qty 2
  Filled 2016-01-03 (×2): qty 3
  Filled 2016-01-03 (×2): qty 2
  Filled 2016-01-03: qty 3
  Filled 2016-01-03: qty 2
  Filled 2016-01-03 (×2): qty 3
  Filled 2016-01-03: qty 5
  Filled 2016-01-03: qty 3
  Filled 2016-01-03 (×2): qty 2
  Filled 2016-01-03: qty 3
  Filled 2016-01-03: qty 2
  Filled 2016-01-03: qty 3
  Filled 2016-01-03 (×2): qty 2
  Filled 2016-01-03: qty 3
  Filled 2016-01-03 (×3): qty 2
  Filled 2016-01-03 (×2): qty 5
  Filled 2016-01-03: qty 3
  Filled 2016-01-03: qty 2
  Filled 2016-01-03: qty 1
  Filled 2016-01-03 (×8): qty 3
  Filled 2016-01-03: qty 8
  Filled 2016-01-03: qty 5
  Filled 2016-01-03 (×5): qty 3
  Filled 2016-01-03: qty 2

## 2016-01-03 MED ORDER — PANTOPRAZOLE SODIUM 40 MG IV SOLR
40.0000 mg | INTRAVENOUS | Status: DC
  Administered 2016-01-03 – 2016-01-05 (×4): 40 mg via INTRAVENOUS
  Filled 2016-01-03 (×4): qty 40

## 2016-01-03 MED ORDER — CHLORHEXIDINE GLUCONATE 0.12% ORAL RINSE (MEDLINE KIT)
15.0000 mL | Freq: Two times a day (BID) | OROMUCOSAL | Status: DC
  Administered 2016-01-03 – 2016-01-15 (×26): 15 mL via OROMUCOSAL
  Filled 2016-01-03 (×27): qty 15

## 2016-01-03 MED ORDER — SODIUM CHLORIDE 0.9 % IV BOLUS (SEPSIS)
1000.0000 mL | Freq: Once | INTRAVENOUS | Status: AC
  Administered 2016-01-03: 1000 mL via INTRAVENOUS

## 2016-01-03 MED ORDER — POLYETHYLENE GLYCOL 3350 17 G PO PACK
17.0000 g | PACK | Freq: Every day | ORAL | Status: DC
  Administered 2016-01-03 – 2016-01-06 (×4): 17 g via NASOGASTRIC
  Filled 2016-01-03 (×4): qty 1

## 2016-01-03 MED ORDER — SODIUM CHLORIDE 0.45 % IV SOLN
INTRAVENOUS | Status: AC
  Administered 2016-01-03 – 2016-01-04 (×2): via INTRAVENOUS

## 2016-01-03 NOTE — Progress Notes (Addendum)
PARENTERAL NUTRITION CONSULT NOTE - INITIAL  Pharmacy Consult for Electrolyte Monitoring and Replacement Indication: Hypokalemia  No Known Allergies  Patient Measurements: Height:  (167.6 cm) Weight: 121 lb 4.1 oz (55 kg) IBW/kg (Calculated) : 63.8  Vital Signs: Temp: 97.7 F (36.5 C) (02/17 2000) Temp Source: Axillary (02/17 2000) BP: 103/69 mmHg (02/17 2300) Pulse Rate: 88 (02/17 2300) Intake/Output from previous day: 02/16 0701 - 02/17 0700 In: 430.9 [I.V.:280.9; IV Piggyback:150] Out: 350 [Urine:350] Intake/Output from this shift: Total I/O In: 984.8 [I.V.:624.8; Other:60; NG/GT:200; IV Piggyback:100] Out: 300 [Urine:125; Stool:175]  Labs:  Recent Labs  01/01/16 0207 01/03/16 0608  WBC 12.3* 15.2*  HGB 13.9 10.3*  HCT 40.5 30.8*  PLT 203 288     Recent Labs  01/01/16 0207 01/03/16 0608 01/03/16 2303  NA 134* 139  --   K 3.8 3.0* 3.3*  CL 103 101  --   CO2 21* 30  --   GLUCOSE 157* 190*  --   BUN 28* 29*  --   CREATININE 1.14 1.24  --   CALCIUM 7.1* 7.1*  --   MG  --  2.5*  --   PROT  --  5.4*  --   ALBUMIN  --  2.1*  --   AST  --  122*  --   ALT  --  101*  --   ALKPHOS  --  47  --   BILITOT  --  1.2  --    Estimated Creatinine Clearance: 42.5 mL/min (by C-G formula based on Cr of 1.24).    Recent Labs  01/01/16 0157 01/03/16 1827 01/03/16 1938  GLUCAP 174* 209* 186*    Medical History: History reviewed. No pertinent past medical history.  Medications:  Scheduled:  . amLODipine  5 mg Oral Daily  . antiseptic oral rinse  7 mL Mouth Rinse QID  . azithromycin  500 mg Intravenous Q24H  . budesonide (PULMICORT) nebulizer solution  0.25 mg Nebulization BID  . chlorhexidine gluconate  15 mL Mouth Rinse BID  . free water  200 mL Per Tube 3 times per day  . haloperidol lactate  2 mg Intravenous Once  . insulin aspart  0-15 Units Subcutaneous 6 times per day  . methylPREDNISolone (SOLU-MEDROL) injection  40 mg Intravenous Q12H  .  metoprolol tartrate  25 mg Oral BID  . pantoprazole (PROTONIX) IV  40 mg Intravenous Q24H  . piperacillin-tazobactam  3.375 g Intravenous 3 times per day  . polyethylene glycol  17 g Per NG tube Daily  . [START ON 01/04/2016] potassium chloride  10 mEq Intravenous Q1 Hr x 4   Infusions:  . sodium chloride 75 mL/hr at 01/03/16 2200  . dexmedetomidine 0.604 mcg/kg/hr (01/03/16 2200)  . feeding supplement (VITAL AF 1.2 CAL) 1,000 mL (01/03/16 2200)  . fentaNYL infusion INTRAVENOUS Stopped (01/03/16 1110)     Assessment: Pharmacy consulted to assist in replacing electrolytes in this 70 y/o M with respiratory failure and PNA. Patient is currently ventilated and sedated.   Plan:  Provider entered order for KPhos 30 mmol IV x 1 and KlorCon PO 20 mEq x 1. Will adjust AM labs to 0800 so entire dose can infuse.   Carola Frost, Pharm.D., BCPS Clinical Pharmacist 01/03/2016,11:41 PM

## 2016-01-03 NOTE — Progress Notes (Signed)
Nutrition Follow-up     INTERVENTION:   EN: dietitian consult received for initiation and management of TF. Recommend starting Vital AF 1.2 at initial rate of 20 ml/hr post CT today if medically able. If tolerating, goal rate is 50 ml/hr meeting 100% of estimated protein and calorie needs. Continue to assess   NUTRITION DIAGNOSIS:   Inadequate oral intake related to acute illness as evidenced by meal completion < 50%. Continues but being addressed via TF  GOAL:   Patient will meet greater than or equal to 90% of their needs  MONITOR:    (Energy Intake, Anthropometrics, Digestive System)  REASON FOR ASSESSMENT:   Consult Enteral/tube feeding initiation and management  ASSESSMENT:    Pt with respiratory decline yesterday requiring intubation, s/p bronch; currently sedated on vent  Diet Order:  Diet NPO time specified   Digestive System: abdomen firm, BS active, some melatonic stools in past 24 hours, Hgb down to 10.3, GI consulted, CT abdomen pending  Skin:  Reviewed, no issues   Recent Labs Lab 12/31/15 0522 01/01/16 0207 01/03/16 0608  NA 139 134* 139  K 3.8 3.8 3.0*  CL 109 103 101  CO2 24 21* 30  BUN 29* 28* 29*  CREATININE 0.97 1.14 1.24  CALCIUM 7.0* 7.1* 7.1*  MG  --   --  2.5*  GLUCOSE 163* 157* 190*    Glucose Profile:   Recent Labs  01/01/16 0157  GLUCAP 174*   Nutritional Anemia Profile:  CBC Latest Ref Rng 01/03/2016 01/01/2016 12/31/2015  WBC 3.8 - 10.6 K/uL 15.2(H) 12.3(H) 8.0  Hemoglobin 13.0 - 18.0 g/dL 10.3(L) 13.9 15.6  Hematocrit 40.0 - 52.0 % 30.8(L) 40.5 44.9  Platelets 150 - 440 K/uL 288 203 180    Meds: solumedrol, precedex   Height:   Ht Readings from Last 1 Encounters:  12/20/2015  (1.676 m)    Weight:   Wt Readings from Last 1 Encounters:  01/03/16 121 lb 4.1 oz (55 kg)    Filed Weights   12/31/15 0414 01/02/16 0500 01/03/16 0500  Weight: 126 lb 2.6 oz (57.227 kg) 117 lb 15.1 oz (53.5 kg) 121 lb 4.1 oz (55 kg)     BMI:  Body mass index is 19.58 kg/(m^2).  Estimated Nutritional Needs:   Kcal:  1468 kcals (BEE 1243, Ve: 5.1, Tmax: 37.9) using wt of 55 kg  Protein:  83-110 g (1.5-2.0 g/kg)   Fluid:  1375-1650 mL (25-30 ml/kg)   HIGH Care Level  Romelle Starcher MS, RD, LDN [phone] Pager  [phone] Weekend/On-Call Pager

## 2016-01-03 NOTE — Progress Notes (Signed)
eLink Physician-Brief Progress Note Patient Name: Larry Pacheco DOB: 05-05-1946 MRN: [id number]   Date of Service  01/03/2016  HPI/Events of Note  Notified that this ventilated patient requires DVT and Stress Ulcer Prophylaxis. Nurse reports recent Hx of tarry dark stools.   eICU Interventions  Will order: 1. Place SCD's to bilateral lower extremities.  2. Protonix IV.     Intervention Category Intermediate Interventions: Best-practice therapies (e.g. DVT, beta blocker, etc.)  Willaim Mode Eugene 01/03/2016, 12:17 AM

## 2016-01-03 NOTE — Progress Notes (Signed)
PULMONARY / CRITICAL CARE MEDICINE   Name: Larry Pacheco MRN: [id number] DOB: 06-10-1946    ADMISSION DATE:  12/25/2015  BRIEF HISTORY: 70 year old male with no stomach and past medical history, admitted on 12/30/2011 for shortness of breath, and intermittent hemoptysis, started experiencing shortness of hemoptysis along with cough about one week ago after going out to the [place] to bird watch. Nonsmoker, states he is in good health otherwise. CTA chest negative for pulmonary embolus. Acute decline in respiratory status from nasal cannula to high flow oxygen to requiring intubation.  SUBJECTIVE:  Worsening respiratory distress leading to respiratory failure yesterday, requiring intubation, had a bronchoscopy done. This morning doing well, mildly firm abdomen, had some melanotic stools in the last 24 hours.  STUDIES:  CTA chest 2/12>> no PE, multilobar basilar pneumonia, mildly tight findings, pneumonitis  SIGNIFICANT EVENTS: 2/16>> worsening hypoxia requiring high flow nasal cannula, unable to wean, subsequently intubated  VITAL SIGNS: Temp:  [98.5 F (36.9 C)-100.2 F (37.9 C)] 99.1 F (37.3 C) (02/17 0728) Pulse Rate:  [94-133] 110 (02/17 0800) Resp:  [10-36] 17 (02/17 0800) BP: (92-142)/(63-82) 120/70 mmHg (02/17 0917) SpO2:  [91 %-99 %] 99 % (02/17 0800) FiO2 (%):  [75 %-100 %] 80 % (02/17 0830) Weight:  [121 lb 4.1 oz (55 kg)] 121 lb 4.1 oz (55 kg) (02/17 0500) HEMODYNAMICS: CVP:  [4 mmHg-12 mmHg] 12 mmHg VENTILATOR SETTINGS: Vent Mode:  [-] PRVC FiO2 (%):  [75 %-100 %] 80 % Set Rate:  [15 bmp] 15 bmp Vt Set:  [390 mL] 390 mL PEEP:  [5 cmH20-8 cmH20] 8 cmH20 Plateau Pressure:  [15 cmH20] 15 cmH20 INTAKE / OUTPUT:  Intake/Output Summary (Last 24 hours) at 01/03/16 1049 Last data filed at 01/03/16 0800  Gross per 24 hour  Intake 470.88 ml  Output    350 ml  Net 120.88 ml    Review of Systems  Constitutional: Negative for fever and chills.  Eyes: Negative for  blurred vision and double vision.  Respiratory: Positive for shortness of breath.   Cardiovascular: Negative for chest pain and palpitations.  Gastrointestinal: Negative for heartburn and nausea.  Genitourinary: Negative for dysuria.  Musculoskeletal: Negative for myalgias.  Endo/Heme/Allergies: Does not bruise/bleed easily.    Physical Exam  Constitutional: He is well-developed, well-nourished, and in no distress.  HENT:  Head: Normocephalic.  Right Ear: External ear normal.  Left Ear: External ear normal.  Nose: Nose normal.  Mouth/Throat: Oropharynx is clear and moist.  Eyes: Pupils are equal, round, and reactive to light.  Neck: Normal range of motion. Neck supple. No thyromegaly present.  Cardiovascular: Regular rhythm, normal heart sounds and intact distal pulses.   No murmur heard. Pulmonary/Chest: He has no wheezes. He has no rales. He exhibits no tenderness.  No distress on the vent.  No wheezes Mild dec basilar BS  Abdominal: Bowel sounds are normal.  Faint BS, moderately firm abdomen.   Musculoskeletal: Normal range of motion. He exhibits no edema or tenderness.  Neurological: He is alert.  Moderately awake on the vent, following simple commands.   Skin: Skin is warm and dry. He is not diaphoretic.  Nursing note and vitals reviewed.    LABS:  CBC  Recent Labs Lab 12/31/15 0522 01/01/16 0207 01/03/16 0608  WBC 8.0 12.3* 15.2*  HGB 15.6 13.9 10.3*  HCT 44.9 40.5 30.8*  PLT 180 203 288   Coag's  Recent Labs Lab 01/05/2016 1501  INR 1.01   BMET  Recent Labs Lab 12/31/15 0522  01/01/16 0207 01/03/16 0608  NA 139 134* 139  K 3.8 3.8 3.0*  CL 109 103 101  CO2 24 21* 30  BUN 29* 28* 29*  CREATININE 0.97 1.14 1.24  GLUCOSE 163* 157* 190*   Electrolytes  Recent Labs Lab 12/31/15 0522 01/01/16 0207 01/03/16 0608  CALCIUM 7.0* 7.1* 7.1*  MG  --   --  2.5*   Sepsis Markers  Recent Labs Lab 12/18/2015 1555 01/11/2016 1836  LATICACIDVEN 2.7*  1.6   ABG  Recent Labs Lab 01/02/16 1715 01/03/16 0917  PHART 7.47* 7.43  PCO2ART 46 53*  PO2ART 50* 51*   Liver Enzymes  Recent Labs Lab 12/21/2015 1501 01/03/16 0608  AST 85* 122*  ALT 41 101*  ALKPHOS 58 47  BILITOT 0.6 1.2  ALBUMIN 3.4* 2.1*   Cardiac Enzymes  Recent Labs Lab 12/28/2015 1836 12/29/2015 1958 12/31/15 0522  TROPONINI 0.06* 0.05* 0.09*   Glucose  Recent Labs Lab 01/01/16 0157  GLUCAP 174*    Imaging Dg Chest Port 1 View  01/02/2016  CLINICAL DATA:  70 year old male with respiratory failure admitted on 02/13 with shortness of breath, cough and altered mental status EXAM: PORTABLE CHEST 1 VIEW COMPARISON:  Most recent prior chest x-ray 01/01/2016; CT scan of the chest 12/19/2015 FINDINGS: The patient is intubated. The tip of the endotracheal tube is 4.3 cm above the carina. Right IJ approach central venous catheter. The tip overlies the mid SVC. No evidence of pneumothorax or other complication. Stable cardiac and mediastinal contours. Slightly increased bilateral patchy interstitial and airspace opacities in a relatively perihilar distribution. No pneumothorax. No acute osseous abnormality. IMPRESSION: 1. Interval intubation. The tip of the endotracheal tube is 4.3 cm above the carina. 2. Right IJ approach central venous catheter with the tip overlying the mid SVC. No evidence of pneumothorax or other complicating feature. 3. Slightly increased bilateral perihilar interstitial and airspace opacities which may reflect developing noncardiogenic edema superimposed on the background pulmonary process. Alternately, this could represent progression of the underlying pulmonary process. Electronically Signed   By: Malachy Moan M.D.   On: 01/02/2016 16:44   Dg Abd Portable 1v  01/03/2016  CLINICAL DATA:  NG tube placement EXAM: PORTABLE ABDOMEN - 1 VIEW COMPARISON:  None. FINDINGS: Enteric tube tip is in the left upper quadrant consistent with location in the body  of the stomach. Gas-filled nondistended colon suggesting ileus. IMPRESSION: Enteric tube tip is in the left upper quadrant consistent with location in the body of the stomach. Electronically Signed   By: Burman Nieves M.D.   On: 01/03/2016 03:01    Cultures: BCx2 2/13>> UC  Sputum 2/13>> BAL 2/16>> Influenza negative.  Cdiff negative Hemoccult POSITIVE  Antibiotics: Zosyn 2/15>> Vanc 2/15>> Lines:  ASSESSMENT / PLAN: 70 year old male no significant past medical history now with multifocal pneumonia, cough, dyspnea, accidental fall, requiring supplemental oxygen, chest CT concerning for interstitial lung disease versus multifocal pneumonia  PULMONARY VDRF Bilateral basilar pneumonia/multifocal pneumonia Hypoxia Dyspnea Hempotysis P:  -Intubated and sedated, continue mechanical ventilation, wean as tolerated, contained O2 saturations greater than 88% -CT chest from 12/24/2015 reviewed, there is extensive groundglass opacity and reticulation in both lungs no prior studies to compare with, differentials include interstitial lung disease versus bilateral atypical pneumonia. -Continue with steroids and antibiotics for now. May consider weaning steroids over the weekend -Could be a hypersensitivity type reaction given his acute onset after leaving the [facility] -Continue with supportive care -Keep O2 saturations greater than 88% -May supplemental  oxygen -Unsure if patient is truly having hemoptysis, his bronchoscopy did not yield any blood clots, streaks of blood, BAL fluid was clear without any specks of blood. Could be having hematemesis.  CARDIOVASCULAR Elevated troponin Hypertension P:  Continued monitoring Continue with home meds MAP>65  RENAL ICU electrolyte replacement protocol  GASTROINTESTINAL A: Melanotic Stools Hemoccult positive P: Proton pump inhibitor for stress ulcer prophylaxis Melanotic stools, firm abdomen-CT abdomen and pelvis GI consult  pending  HEMATOLOGIC Monitor CBC  INFECTIOUS Possible multifocal/bilateral pneumonia Continue current antibiotics Plan for bronchoscopy within the next 24 hours with BAL   ENDOCRINE ICU hypo/hyperglycemia protocol  NEUROLOGIC Accidental fall Confusion - improving P:  RASS goal: 0 CT head negative for acute hemorrhage   Social - updated sister over the phone on current treatment   Disposition- 70 year old male with acute multifocal pneumonia/pneumonitis, now with melena, intubated for respiratory failure and high requirement of FiO2. Limiting factors from getting liberated from the vent include ability to wean FiO2 secondary to pneumonitis, otherwise stable on the vent. Currently with melena, and Hemoccult positive. Awaiting CT abdomen and pelvis along with a GI evaluation.  Thank you for consulting [facility], Please feel free to contacts [place] with any questions at [phone] (please enter 7-digits).  I have personally obtained a history, examined the patient, evaluated laboratory and imaging results, formulated the assessment and plan and placed orders.  The Patient requires high complexity decision making for assessment and support, frequent evaluation and titration of therapies, application of advanced monitoring technologies and extensive interpretation of multiple databases. Critical Care Time devoted to patient care services described in this note is 45 minutes.   Overall, patient is critically ill, prognosis is guarded. Patient at high risk for cardiac arrest and death.    Stephanie Acre, MD [facility] Pager [phone] (please enter 7-digits) On Call Pager - [phone] (please enter 7-digits)  Note: This note was prepared with Dragon dictation along with smaller phrase technology. Any transcriptional errors that result from this process are unintentional.

## 2016-01-03 NOTE — Progress Notes (Signed)
eLink Physician-Brief Progress Note Patient Name: Larry Pacheco DOB: 1946-04-25 MRN: [id number]   Date of Service  01/03/2016  HPI/Events of Note  hyperglycemia  eICU Interventions  Sliding scale insulin     Intervention Category Intermediate Interventions: Hyperglycemia - evaluation and treatment  Max Fickle 01/03/2016, 7:32 PM

## 2016-01-03 NOTE — Progress Notes (Signed)
[facility] [place] [facility] [place]   PATIENT NAME: Larry Pacheco    MR#:  [id number]  DATE OF BIRTH:  1946/03/29  SUBJECTIVE:  Admitted for PNA. Was on RA in ED. Later worsening and Lily --> HFNC --> Intubated on 01/02/2016 with bronchoscopy. Today he is on 60% O2. Wide awake on fentanyl. Melena noticed. Hem positive.  REVIEW OF SYSTEMS:    Review of Systems  Unable to perform ROS: intubated   DRUG ALLERGIES:  No Known Allergies  VITALS:  Blood pressure 120/70, pulse 110, temperature 99.1 F (37.3 C), temperature source Oral, resp. rate 17, height  (1.676 m), weight 55 kg (121 lb 4.1 oz), SpO2 99 %.  PHYSICAL EXAMINATION:   Physical Exam  GENERAL:  70 y.o.-year-old patient lying in the bed. EYES: Pupils equal, round, reactive to light and accommodation. No scleral icterus. Extraocular muscles intact.  HEENT: Head atraumatic, normocephalic. Oropharynx and nasopharynx clear.  ETT. NECK:  Supple, no jugular venous distention. No thyroid enlargement, no tenderness.  LUNGS: Diffuse rhonchi, rales. Good air entry bilaterally. use of accessory muscles of respiration. CARDIOVASCULAR: S1, S2 normal. No murmurs, rubs, or gallops. ABDOMEN: Soft, nontender, nondistended. Bowel sounds present. No organomegaly or mass.  EXTREMITIES: No cyanosis, clubbing or edema b/l.  PSYCHIATRIC: The patient is intubated SKIN: No obvious rash, lesion, or ulcer.    LABORATORY PANEL:   CBC  Recent Labs Lab 01/03/16 0608  WBC 15.2*  HGB 10.3*  HCT 30.8*  PLT 288   ------------------------------------------------------------------------------------------------------------------  Chemistries   Recent Labs Lab 01/03/16 0608  NA 139  K 3.0*  CL 101  CO2 30  GLUCOSE 190*  BUN 29*  CREATININE 1.24  CALCIUM 7.1*  MG 2.5*  AST 122*  ALT 101*  ALKPHOS 47  BILITOT 1.2    ------------------------------------------------------------------------------------------------------------------  Cardiac Enzymes  Recent Labs Lab 12/31/15 0522  TROPONINI 0.09*   ------------------------------------------------------------------------------------------------------------------  RADIOLOGY:  Dg Chest Port 1 View  01/02/2016  CLINICAL DATA:  70 year old male with respiratory failure admitted on 02/13 with shortness of breath, cough and altered mental status EXAM: PORTABLE CHEST 1 VIEW COMPARISON:  Most recent prior chest x-ray 01/01/2016; CT scan of the chest 01/13/2016 FINDINGS: The patient is intubated. The tip of the endotracheal tube is 4.3 cm above the carina. Right IJ approach central venous catheter. The tip overlies the mid SVC. No evidence of pneumothorax or other complication. Stable cardiac and mediastinal contours. Slightly increased bilateral patchy interstitial and airspace opacities in a relatively perihilar distribution. No pneumothorax. No acute osseous abnormality. IMPRESSION: 1. Interval intubation. The tip of the endotracheal tube is 4.3 cm above the carina. 2. Right IJ approach central venous catheter with the tip overlying the mid SVC. No evidence of pneumothorax or other complicating feature. 3. Slightly increased bilateral perihilar interstitial and airspace opacities which may reflect developing noncardiogenic edema superimposed on the background pulmonary process. Alternately, this could represent progression of the underlying pulmonary process. Electronically Signed   By: Malachy Moan M.D.   On: 01/02/2016 16:44   Dg Abd Portable 1v  01/03/2016  CLINICAL DATA:  NG tube placement EXAM: PORTABLE ABDOMEN - 1 VIEW COMPARISON:  None. FINDINGS: Enteric tube tip is in the left upper quadrant consistent with location in the body of the stomach. Gas-filled nondistended colon suggesting ileus. IMPRESSION: Enteric tube tip is in the left upper quadrant  consistent with location in the body of the stomach. Electronically Signed   By: Marisa Cyphers.D.  On: 01/03/2016 03:01     ASSESSMENT AND PLAN:   70 year old male with no significant past medical history presents to the hospital with shortness of breath cough and congestion and noted to be in acute respiratory failure with hypoxia.  # Acute respiratory failure with hypoxia - worsening and now Intubated. - 60% Atypical pneumonia (vs) interstitial lung disease as seen on the CT chest. -Continue IV antibiotics with Zosyn. Vancomycin stopped -- IV steroids, DuoNeb nebs around-the-clock, Pulmicort nebs. - Cx from bronch sent. -- Continue full vent support  # Essential HTN - no previous hx of HTN.  - Stopped due to hypotension  # Elevated Troponin - likely in the setting of demand ischemia from hypoxia.  - no chest pain. Trop. Did not trend upwards.  - 2-D Echo EF 60%  # Agitation/AMS - etiology unclear.  - Likelt metabolic from acute illness PRN Haldol CT head (-).   #. Diarrhea  - resolved now. Stool PCR (-).     All the records are reviewed and case discussed with Care Management/Social Workerr. Management plans discussed with the patient, family and they are in agreement.  CODE STATUS: Full  DVT Prophylaxis: Lovenox  TOTAL CRITICAL CARE TIME TAKING CARE OF THIS PATIENT: 35 minutes.    Milagros Loll R M.D on 01/03/2016 at 1:28 PM  Between 7am to 6pm - Pager - [phone]  After 6pm go to [email or web address] - password [email or web address] [facility]  [facility]  Office  [phone]  CC: Primary care physician; No primary care provider on file.

## 2016-01-03 NOTE — Progress Notes (Signed)
Second bottle of contrast administered via OG tube.

## 2016-01-03 NOTE — Progress Notes (Signed)
OK to use og per [facility] MD.

## 2016-01-03 NOTE — Consult Note (Signed)
GI Inpatient Consult Note  Reason for Consult:   Attending Requesting Consult:  History of Present Illness: Larry Pacheco is a 70 y.o. male with respiratory failure on vent who apparently had hemoptysis and melena on 2/15 before being intubated. Unable to get hx from pt due to intubation. Some drop in hgb today. Denies prior hx of GI bleeding or ulcer disease.  Past Medical History:  History reviewed. No pertinent past medical history.  Problem List: Patient Active Problem List   Diagnosis Date Noted  . Community acquired pneumonia   . Hemoptysis   . Sepsis (HCC)   . Pneumonia 01-18-2016    Past Surgical History: No past surgical history on file.  Allergies: No Known Allergies  Home Medications: No prescriptions prior to admission   Home medication reconciliation was completed with the patient.   Scheduled Inpatient Medications:   . amLODipine  5 mg Oral Daily  . antiseptic oral rinse  7 mL Mouth Rinse QID  . azithromycin  500 mg Intravenous Q24H  . budesonide (PULMICORT) nebulizer solution  0.25 mg Nebulization BID  . chlorhexidine gluconate  15 mL Mouth Rinse BID  . free water  200 mL Per Tube 3 times per day  . haloperidol lactate  2 mg Intravenous Once  . insulin aspart  0-15 Units Subcutaneous 6 times per day  . methylPREDNISolone (SOLU-MEDROL) injection  40 mg Intravenous Q12H  . metoprolol tartrate  25 mg Oral BID  . pantoprazole (PROTONIX) IV  40 mg Intravenous Q24H  . piperacillin-tazobactam  3.375 g Intravenous 3 times per day  . polyethylene glycol  17 g Per NG tube Daily    Continuous Inpatient Infusions:   . sodium chloride 75 mL/hr at 01/03/16 1450  . dexmedetomidine 0.4 mcg/kg/hr (01/03/16 1730)  . feeding supplement (VITAL AF 1.2 CAL) 1,000 mL (01/03/16 1630)  . fentaNYL infusion INTRAVENOUS Stopped (01/03/16 1110)    PRN Inpatient Medications:  acetaminophen **OR** acetaminophen, bisacodyl, chlorpheniramine-HYDROcodone, chlorproMAZINE,  docusate, fentaNYL, levalbuterol, midazolam, midazolam, ondansetron **OR** ondansetron (ZOFRAN) IV, sodium chloride  Family History: family history includes Bowel Disease in his father.  The patient's family history is negative for inflammatory bowel disorders, GI malignancy, or solid organ transplantation.  Social History:   reports that he has never smoked. He does not have any smokeless tobacco history on file. The patient denies ETOH, tobacco, or drug use.   Review of Systems: Constitutional: Weight is stable.  Eyes: No changes in vision. ENT: No oral lesions, sore throat.  GI: see HPI.  Heme/Lymph: No easy bruising.  CV: No chest pain.  GU: No hematuria.  Integumentary: No rashes.  Neuro: No headaches.  Psych: No depression/anxiety.  Endocrine: No heat/cold intolerance.  Allergic/Immunologic: No urticaria.  Resp: No cough, SOB.  Musculoskeletal: No joint swelling.    Physical Examination: BP 109/65 mmHg  Pulse 85  Temp(Src) 97.1 F (36.2 C) (Oral)  Resp 21  Ht  (1.676 m)  Wt 55 kg (121 lb 4.1 oz)  BMI 19.58 kg/m2  SpO2 92% Gen: NAD, alert and oriented x 4 HEENT: PEERLA, EOMI, Neck: supple, no JVD or thyromegaly Chest: CTA bilaterally, no wheezes, crackles, or other adventitious sounds CV: RRR, no m/g/c/r Abd: soft, NT, ND, +BS in all four quadrants; no HSM, guarding, ridigity, or rebound tenderness Ext: no edema, well perfused with 2+ pulses, Skin: no rash or lesions noted Lymph: no LAD  Data: Lab Results  Component Value Date   WBC 15.2* 01/03/2016   HGB 10.3* 01/03/2016  HCT 30.8* 01/03/2016   MCV 89.6 01/03/2016   PLT 288 01/03/2016    Recent Labs Lab 12/31/15 0522 01/01/16 0207 01/03/16 0608  HGB 15.6 13.9 10.3*   Lab Results  Component Value Date   NA 139 01/03/2016   K 3.0* 01/03/2016   CL 101 01/03/2016   CO2 30 01/03/2016   BUN 29* 01/03/2016   CREATININE 1.24 01/03/2016   Lab Results  Component Value Date   ALT 101*  01/03/2016   AST 122* 01/03/2016   ALKPHOS 47 01/03/2016   BILITOT 1.2 01/03/2016    Recent Labs Lab 12/26/2015 1501  INR 1.01   Assessment/Plan: Larry Pacheco is a 70 y.o. male with melena and heme positive. Just because pt has melena does not necessarily mean pt has internal GI bleeding. Pt could have easily swallowed blood when he coughed up blood.   Recommendations: Anyway, pt is tolerating TF well. No mention of melena since 2/15, according to nurse. For now, continue PPI IV. No plans for EGD unless pt is actively bleeding and there is documentation of bleeding from UGI tract.  Thank you for the consult. Please call with questions or concerns.  Faelynn Wynder, Ezzard Standing, MD

## 2016-01-03 NOTE — Progress Notes (Signed)
RT to room for transport of patient to CT by 1500 for scan after contrast administered.  RN called CT to verify they were ready for patient, CT not ready, they have another patient on table at this time

## 2016-01-03 NOTE — Progress Notes (Signed)
Pharmacy Antibiotic Note  Larry Pacheco is a 70 y.o. male admitted on 01/14/2016 with pneumonia and sepsis.  Pharmacy has been consulted for Zosyn dosing. MD adding azithromycin for atypical coverage.   Plan: Will continue patient on Zosyn 3.375 gm IV Q8H EI.   GPC growing in 1/4 blood culture bottles. Biofire shows nothing detected. After discussion with Dr. Dema Severin, will continue Zosyn.   Height:  (167.6 cm) Weight: 121 lb 4.1 oz (55 kg) IBW/kg (Calculated) : 63.8  Temp (24hrs), Avg:99.2 F (37.3 C), Min:98.5 F (36.9 C), Max:100.2 F (37.9 C)   Recent Labs Lab 12/28/2015 1501 12/19/2015 1555 01/06/2016 1836 12/31/15 0522 01/01/16 0207 01/03/16 0608  WBC 7.3  --   --  8.0 12.3* 15.2*  CREATININE 1.76*  --   --  0.97 1.14 1.24  LATICACIDVEN  --  2.7* 1.6  --   --   --     Estimated Creatinine Clearance: 42.5 mL/min (by C-G formula based on Cr of 1.24).    No Known Allergies   Antibiotics:  Ceftriaxone 2/13 >>2/14 Azithromycin 2/13 >>2/14 Vanc 2/15>>2/15 Zosyn 2/15>> Azithromycin 02/16 >>  Microbiology:  02/13 Flu: negative 02/15 MRSA PCR: negative 02/13 Urine cx: negative 02/15 GI panel: negative 02/15 C diff: negative 02/15 Sputum cx: pending 02/15 GPC in 1/4 bottles  Pharmacy will continue to monitor and adjust per consult.  Simpson,Michael L, Pharm.D. 01/03/2016 8:41 AM

## 2016-01-03 NOTE — Progress Notes (Signed)
Oral contrast given first bottle 13:30

## 2016-01-03 NOTE — Progress Notes (Signed)
PARENTERAL NUTRITION CONSULT NOTE - INITIAL  Pharmacy Consult for Electrolyte Monitoring and Replacement Indication: Hypokalemia  No Known Allergies  Patient Measurements: Height:  (167.6 cm) Weight: 121 lb 4.1 oz (55 kg) IBW/kg (Calculated) : 63.8  Vital Signs: Temp: 99.1 F (37.3 C) (02/17 0728) Temp Source: Oral (02/17 0728) BP: 120/70 mmHg (02/17 0917) Pulse Rate: 110 (02/17 0800) Intake/Output from previous day: 02/16 0701 - 02/17 0700 In: 430.9 [I.V.:280.9; IV Piggyback:150] Out: 350 [Urine:350] Intake/Output from this shift: Total I/O In: 40 [I.V.:40] Out: -   Labs:  Recent Labs  01/01/16 0207 01/03/16 0608  WBC 12.3* 15.2*  HGB 13.9 10.3*  HCT 40.5 30.8*  PLT 203 288     Recent Labs  01/01/16 0207 01/03/16 0608  NA 134* 139  K 3.8 3.0*  CL 103 101  CO2 21* 30  GLUCOSE 157* 190*  BUN 28* 29*  CREATININE 1.14 1.24  CALCIUM 7.1* 7.1*  MG  --  2.5*  PROT  --  5.4*  ALBUMIN  --  2.1*  AST  --  122*  ALT  --  101*  ALKPHOS  --  47  BILITOT  --  1.2   Estimated Creatinine Clearance: 42.5 mL/min (by C-G formula based on Cr of 1.24).    Recent Labs  01/01/16 0157  GLUCAP 174*    Medical History: History reviewed. No pertinent past medical history.  Medications:  Scheduled:  . amLODipine  5 mg Oral Daily  . antiseptic oral rinse  7 mL Mouth Rinse QID  . azithromycin  500 mg Intravenous Q24H  . budesonide (PULMICORT) nebulizer solution  0.25 mg Nebulization BID  . chlorhexidine gluconate  15 mL Mouth Rinse BID  . feeding supplement (ENSURE ENLIVE)  237 mL Oral BID WC  . haloperidol lactate  2 mg Intravenous Once  . hydrochlorothiazide  25 mg Oral Daily  . iohexol  25 mL Oral Q1 Hr x 2  . methylPREDNISolone (SOLU-MEDROL) injection  40 mg Intravenous Q12H  . metoprolol tartrate  25 mg Oral BID  . pantoprazole (PROTONIX) IV  40 mg Intravenous Q24H  . piperacillin-tazobactam  3.375 g Intravenous 3 times per day  . polyethylene  glycol  17 g Per NG tube Daily  . potassium chloride  10 mEq Intravenous Q1 Hr x 6  . sodium chloride  1,000 mL Intravenous Once   Infusions:  . dexmedetomidine 0.6 mcg/kg/hr (01/03/16 1123)  . fentaNYL infusion INTRAVENOUS Stopped (01/03/16 1110)     Assessment: Pharmacy consulted to assist in replacing electrolytes in this 70 y/o M with respiratory failure and PNA. Patient is currently ventilated and sedated.   Plan:  Potassium is low so will replace with 6 runs of KCL 10 meq and recheck at 1800.   Luisa Hart D 01/03/2016,11:38 AM

## 2016-01-03 NOTE — Progress Notes (Signed)
1920: Received report from Dois Davenport, [employer]. Pt. On ventilator at 80% but desatting below goal of 88% to as low as 84%.  [facility], RT to alert of possible need for increase in FiO2. Dois Davenport reports this has been happening throughout the day as well.  Pt. Has precedex gtt, maintenance and TF running.  1930: RT changed FiO2 to 100% from 80% Spoke with Dr. Kendrick Fries @ [facility] r/t to pt.'s CBG > 200, AM MD restarted TF but had no orders for insulin. Dr. Kendrick Fries ordered insulin sliding scale Q4 and changed CBG monitoring from Q6 to Q 4

## 2016-01-03 NOTE — Progress Notes (Signed)
RT to room with trilogy vent to transport patient to CT.  Patient transported to and from CT on trilogy vent without incident.  Returned to ICU 20 and placed patient back on servo vent.

## 2016-01-04 ENCOUNTER — Inpatient Hospital Stay: Payer: Medicare Other

## 2016-01-04 LAB — CBC
HCT: 24.3 % — ABNORMAL LOW (ref 40.0–52.0)
Hemoglobin: 8.3 g/dL — ABNORMAL LOW (ref 13.0–18.0)
MCH: 30.1 pg (ref 26.0–34.0)
MCHC: 34 g/dL (ref 32.0–36.0)
MCV: 88.6 fL (ref 80.0–100.0)
PLATELETS: 282 10*3/uL (ref 150–440)
RBC: 2.74 MIL/uL — AB (ref 4.40–5.90)
RDW: 13.6 % (ref 11.5–14.5)
WBC: 10.6 10*3/uL (ref 3.8–10.6)

## 2016-01-04 LAB — CULTURE, BLOOD (ROUTINE X 2): CULTURE: NO GROWTH

## 2016-01-04 LAB — BASIC METABOLIC PANEL
Anion gap: 1 — ABNORMAL LOW (ref 5–15)
BUN: 32 mg/dL — AB (ref 6–20)
CALCIUM: 6.7 mg/dL — AB (ref 8.9–10.3)
CO2: 35 mmol/L — ABNORMAL HIGH (ref 22–32)
Chloride: 102 mmol/L (ref 101–111)
Creatinine, Ser: 1.08 mg/dL (ref 0.61–1.24)
GFR calc Af Amer: >60 mL/min (ref >60)
GLUCOSE: 190 mg/dL — AB (ref 65–99)
Potassium: 3.8 mmol/L (ref 3.5–5.1)
Sodium: 138 mmol/L (ref 135–145)

## 2016-01-04 LAB — PHOSPHORUS: Phosphorus: 3.1 mg/dL (ref 2.5–4.6)

## 2016-01-04 LAB — TRIGLYCERIDES: Triglycerides: 95 mg/dL (ref <150)

## 2016-01-04 LAB — BLOOD GAS, ARTERIAL
Acid-Base Excess: 5.2 mmol/L — ABNORMAL HIGH (ref 0.0–3.0)
Bicarbonate: 31.5 mEq/L — ABNORMAL HIGH (ref 21.0–28.0)
FIO2: 1
MECHVT: 390 mL
Mechanical Rate: 20
O2 Saturation: 87.9 %
PATIENT TEMPERATURE: 37
PCO2 ART: 52 mmHg — AB (ref 32.0–48.0)
PEEP: 14 cmH2O
PO2 ART: 55 mmHg — AB (ref 83.0–108.0)
pH, Arterial: 7.39 (ref 7.350–7.450)

## 2016-01-04 LAB — MAGNESIUM: Magnesium: 2.4 mg/dL (ref 1.7–2.4)

## 2016-01-04 LAB — CULTURE, RESPIRATORY: SPECIAL REQUESTS: NORMAL

## 2016-01-04 LAB — GLUCOSE, CAPILLARY
GLUCOSE-CAPILLARY: 198 mg/dL — AB (ref 65–99)
GLUCOSE-CAPILLARY: 243 mg/dL — AB (ref 65–99)
Glucose-Capillary: 154 mg/dL — ABNORMAL HIGH (ref 65–99)
Glucose-Capillary: 186 mg/dL — ABNORMAL HIGH (ref 65–99)
Glucose-Capillary: 262 mg/dL — ABNORMAL HIGH (ref 65–99)
Glucose-Capillary: 289 mg/dL — ABNORMAL HIGH (ref 65–99)

## 2016-01-04 LAB — CULTURE, RESPIRATORY W GRAM STAIN

## 2016-01-04 MED ORDER — FENTANYL CITRATE (PF) 100 MCG/2ML IJ SOLN
50.0000 ug | Freq: Once | INTRAMUSCULAR | Status: AC
  Administered 2016-01-04: 50 ug via INTRAVENOUS

## 2016-01-04 MED ORDER — VANCOMYCIN HCL IN DEXTROSE 1-5 GM/200ML-% IV SOLN
1000.0000 mg | Freq: Once | INTRAVENOUS | Status: AC
  Administered 2016-01-04: 1000 mg via INTRAVENOUS
  Filled 2016-01-04: qty 200

## 2016-01-04 MED ORDER — SODIUM CHLORIDE 0.45 % IV SOLN
INTRAVENOUS | Status: DC
  Administered 2016-01-04 – 2016-01-05 (×2): via INTRAVENOUS

## 2016-01-04 MED ORDER — HEPARIN SODIUM (PORCINE) 5000 UNIT/ML IJ SOLN
5000.0000 [IU] | Freq: Three times a day (TID) | INTRAMUSCULAR | Status: DC
  Administered 2016-01-04 – 2016-01-05 (×4): 5000 [IU] via SUBCUTANEOUS
  Filled 2016-01-04 (×4): qty 1

## 2016-01-04 MED ORDER — PRO-STAT SUGAR FREE PO LIQD
30.0000 mL | Freq: Two times a day (BID) | ORAL | Status: DC
  Administered 2016-01-04 – 2016-01-06 (×6): 30 mL

## 2016-01-04 MED ORDER — NOREPINEPHRINE BITARTRATE 1 MG/ML IV SOLN
0.0000 ug/min | INTRAVENOUS | Status: DC
  Administered 2016-01-04: 5 ug/min via INTRAVENOUS
  Filled 2016-01-04: qty 4

## 2016-01-04 MED ORDER — POTASSIUM PHOSPHATES 15 MMOLE/5ML IV SOLN
30.0000 mmol | Freq: Once | INTRAVENOUS | Status: AC
  Administered 2016-01-04: 30 mmol via INTRAVENOUS
  Filled 2016-01-04 (×2): qty 10

## 2016-01-04 MED ORDER — VECURONIUM BROMIDE 10 MG IV SOLR
10.0000 mg | Freq: Once | INTRAVENOUS | Status: AC
  Administered 2016-01-04: 10 mg via INTRAVENOUS
  Filled 2016-01-04: qty 10

## 2016-01-04 MED ORDER — FENTANYL CITRATE (PF) 100 MCG/2ML IJ SOLN
25.0000 ug | INTRAMUSCULAR | Status: DC | PRN
  Administered 2016-01-04 (×3): 75 ug via INTRAVENOUS
  Filled 2016-01-04 (×3): qty 2

## 2016-01-04 MED ORDER — STERILE WATER FOR INJECTION IJ SOLN
INTRAMUSCULAR | Status: AC
  Administered 2016-01-04: 10 mL
  Filled 2016-01-04: qty 10

## 2016-01-04 MED ORDER — FENTANYL BOLUS VIA INFUSION
25.0000 ug | INTRAVENOUS | Status: DC | PRN
  Administered 2016-01-04 – 2016-01-12 (×9): 25 ug via INTRAVENOUS
  Filled 2016-01-04: qty 25

## 2016-01-04 MED ORDER — FUROSEMIDE 10 MG/ML IJ SOLN
40.0000 mg | Freq: Once | INTRAMUSCULAR | Status: AC
  Administered 2016-01-04: 40 mg via INTRAVENOUS
  Filled 2016-01-04: qty 4

## 2016-01-04 MED ORDER — PROPOFOL 1000 MG/100ML IV EMUL
0.0000 ug/kg/min | INTRAVENOUS | Status: DC
  Administered 2016-01-04: 10 ug/kg/min via INTRAVENOUS
  Administered 2016-01-05: 15 ug/kg/min via INTRAVENOUS
  Administered 2016-01-05: 30 ug/kg/min via INTRAVENOUS
  Administered 2016-01-05: 20 ug/kg/min via INTRAVENOUS
  Administered 2016-01-06: 35 ug/kg/min via INTRAVENOUS
  Administered 2016-01-06: 25 ug/kg/min via INTRAVENOUS
  Administered 2016-01-06: 30 ug/kg/min via INTRAVENOUS
  Administered 2016-01-07 – 2016-01-08 (×3): 20 ug/kg/min via INTRAVENOUS
  Administered 2016-01-08: 25 ug/kg/min via INTRAVENOUS
  Administered 2016-01-09 (×3): 30 ug/kg/min via INTRAVENOUS
  Administered 2016-01-09 – 2016-01-10 (×2): 40 ug/kg/min via INTRAVENOUS
  Administered 2016-01-10: 50 ug/kg/min via INTRAVENOUS
  Administered 2016-01-10 – 2016-01-11 (×3): 40 ug/kg/min via INTRAVENOUS
  Administered 2016-01-11: 50 ug/kg/min via INTRAVENOUS
  Filled 2016-01-04 (×21): qty 100

## 2016-01-04 MED ORDER — POTASSIUM CHLORIDE 20 MEQ PO PACK
20.0000 meq | PACK | Freq: Once | ORAL | Status: AC
Start: 2016-01-04 — End: 2016-01-04
  Administered 2016-01-04: 20 meq
  Filled 2016-01-04: qty 1

## 2016-01-04 MED ORDER — VITAL AF 1.2 CAL PO LIQD
1000.0000 mL | ORAL | Status: DC
  Administered 2016-01-04 – 2016-01-05 (×2): 1000 mL

## 2016-01-04 MED ORDER — FENTANYL 2500MCG IN NS 250ML (10MCG/ML) PREMIX INFUSION
25.0000 ug/h | INTRAVENOUS | Status: DC
  Administered 2016-01-04: 300 ug/h via INTRAVENOUS
  Administered 2016-01-04: 400 ug/h via INTRAVENOUS
  Administered 2016-01-04: 50 ug/h via INTRAVENOUS
  Administered 2016-01-05 – 2016-01-06 (×4): 300 ug/h via INTRAVENOUS
  Administered 2016-01-06 (×2): 350 ug/h via INTRAVENOUS
  Administered 2016-01-06: 300 ug/h via INTRAVENOUS
  Administered 2016-01-07: 200 ug/h via INTRAVENOUS
  Administered 2016-01-07: 250 ug/h via INTRAVENOUS
  Administered 2016-01-08 – 2016-01-09 (×4): 300 ug/h via INTRAVENOUS
  Administered 2016-01-09: 30 ug/h via INTRAVENOUS
  Administered 2016-01-09: 300 ug/h via INTRAVENOUS
  Administered 2016-01-10 (×3): 350 ug/h via INTRAVENOUS
  Administered 2016-01-11: 40 ug/h via INTRAVENOUS
  Administered 2016-01-11: 35 ug/h via INTRAVENOUS
  Administered 2016-01-11 – 2016-01-12 (×5): 350 ug/h via INTRAVENOUS
  Administered 2016-01-13: 180 ug/h via INTRAVENOUS
  Administered 2016-01-13: 250 ug/h via INTRAVENOUS
  Administered 2016-01-13: 350 ug/h via INTRAVENOUS
  Administered 2016-01-14: 50 ug/h via INTRAVENOUS
  Administered 2016-01-14: 25 ug/h via INTRAVENOUS
  Administered 2016-01-14: 100 ug/h via INTRAVENOUS
  Administered 2016-01-15 (×2): 50 ug/h via INTRAVENOUS
  Filled 2016-01-04 (×33): qty 250

## 2016-01-04 NOTE — Progress Notes (Signed)
Multiple discussions w/ RT and Dr. Darrick Penna r/t pt.'s RR and O2sats on the vent. Titrated up on fent gtt to max, gave one time of vec, added propofol. RT applied recruitment maneuvers and altered vent settings as needed.  Pt. Currently at RASS -2, RR in synch w/ vent, O2Sat 92%. (see EMR for other details)  Pt. Condom cath continues to fall off, may want to consider inserted foley w/ pt. This sedated.

## 2016-01-04 NOTE — Progress Notes (Signed)
[facility] Physician-Brief Progress Note Patient Name: Larry Pacheco DOB: [id number] MRN: [id number]   Date of Service  01/04/2016  HPI/Events of Note  Continued issues of hypoxia despite increase in sedation.  Patient currently at 1.2 of precedex and 325 mcg of fentanyl and continues to breath over the vent with sats of 85% and moving in bed.  eICU Interventions  Plan: Recruitment maneuvers PCXR Consider additional sedation      Intervention Category Major Interventions: Respiratory failure - evaluation and management  DETERDING,ELIZABETH 01/04/2016, 5:17 AM

## 2016-01-04 NOTE — Progress Notes (Addendum)
Recruitment maneuver performed per elink md 0532. Placed patient on pc 40 rr 10 100% peep 5 Ti 3 with I:E 1:1. X 2 minutes. Patient tolerated procedure well despite moving around in bed .  BBS coarse. Saturation increased to 92 however eventually dropped back to mid 80's. Vent setting returned to previous post maneuver. Received new orders for [employer]. See flowsheet.

## 2016-01-04 NOTE — Progress Notes (Signed)
PARENTERAL NUTRITION CONSULT NOTE - INITIAL  Pharmacy Consult for Electrolyte Monitoring and Replacement Indication: Hypokalemia  No Known Allergies  Patient Measurements: Height:  (167.6 cm) Weight: 132 lb 15 oz (60.3 kg) IBW/kg (Calculated) : 63.8  Vital Signs: Temp: 98.6 F (37 C) (02/18 0400) Temp Source: Axillary (02/18 0400) BP: 137/86 mmHg (02/18 0600) Pulse Rate: 96 (02/18 0600) Intake/Output from previous day: 02/17 0701 - 02/18 0700 In: 2606.4 [I.V.:1466.4; NG/GT:430; IV Piggyback:610] Out: 1412 [Urine:1237; Stool:175] Intake/Output from this shift: Total I/O In: 10.7 [I.V.:10.7] Out: -   Labs:  Recent Labs  01/03/16 0608  WBC 15.2*  HGB 10.3*  HCT 30.8*  PLT 288     Recent Labs  01/03/16 0608 01/03/16 2303 01/03/16 2308 01/04/16 0540  NA 139  --   --  138  K 3.0* 3.3*  --  3.8  CL 101  --   --  102  CO2 30  --   --  35*  GLUCOSE 190*  --   --  190*  BUN 29*  --   --  32*  CREATININE 1.24  --   --  1.08  CALCIUM 7.1*  --   --  6.7*  MG 2.5*  --   --  2.4  PHOS  --   --  1.8* 3.1  PROT 5.4*  --   --   --   ALBUMIN 2.1*  --   --   --   AST 122*  --   --   --   ALT 101*  --   --   --   ALKPHOS 47  --   --   --   BILITOT 1.2  --   --   --    Estimated Creatinine Clearance: 53.5 mL/min (by C-G formula based on Cr of 1.08).    Recent Labs  01/03/16 1938 01/03/16 2349 01/04/16 0408  GLUCAP 186* 164* 154*    Medical History: History reviewed. No pertinent past medical history.  Medications:  Scheduled:  . amLODipine  5 mg Oral Daily  . antiseptic oral rinse  7 mL Mouth Rinse QID  . azithromycin  500 mg Intravenous Q24H  . budesonide (PULMICORT) nebulizer solution  0.25 mg Nebulization BID  . chlorhexidine gluconate  15 mL Mouth Rinse BID  . free water  200 mL Per Tube 3 times per day  . furosemide  40 mg Intravenous Once  . haloperidol lactate  2 mg Intravenous Once  . insulin aspart  0-15 Units Subcutaneous 6 times per day   . methylPREDNISolone (SOLU-MEDROL) injection  40 mg Intravenous Q12H  . metoprolol tartrate  25 mg Oral BID  . pantoprazole (PROTONIX) IV  40 mg Intravenous Q24H  . piperacillin-tazobactam  3.375 g Intravenous 3 times per day  . polyethylene glycol  17 g Per NG tube Daily   Infusions:  . sodium chloride 75 mL/hr at 01/04/16 0600  . dexmedetomidine 1.2 mcg/kg/hr (01/04/16 0600)  . feeding supplement (VITAL AF 1.2 CAL) 1,000 mL (01/04/16 0600)  . fentaNYL infusion INTRAVENOUS 400 mcg/hr (01/04/16 0600)  . propofol (DIPRIVAN) infusion 8 mcg/kg/min (01/04/16 1610)     Assessment: Pharmacy consulted to assist in replacing electrolytes in this 70 y/o M with respiratory failure and PNA. Patient is currently ventilated and sedated.   Plan:  Electrolytes are WNL.  Will recheck with am labs.  Stormy Card, RPh Clinical Pharmacist 01/04/2016,9:23 AM

## 2016-01-04 NOTE — Progress Notes (Signed)
[facility] spoke w/ Dr. Robb Matar.  Discussed pt.'s admission Dx and lab results today. MD ordered Kphos to replace both K+ and Ph.  Discussed pt.'s low UOP, pt. Currently has condom cath- bladder scan showed 300 mL, pt. Then voided 75 mL, will continue to monitor, MD stated can I&O cath in at 0200 if still retaining urine.

## 2016-01-04 NOTE — Progress Notes (Signed)
Pharmacy Antibiotic Note  Larry Pacheco is a 70 y.o. male admitted on 01/11/2016 with pneumonia and sepsis.  Pharmacy has been consulted for Zosyn dosing. MD adding azithromycin for atypical coverage.   Plan: Will continue patient on Zosyn 3.375 gm IV Q8H EI.   GPC growing in 1/4 blood culture bottles. Biofire shows nothing detected. After discussion with Dr. Dema Severin, will continue Zosyn.   Height:  (167.6 cm) Weight: 132 lb 15 oz (60.3 kg) IBW/kg (Calculated) : 63.8  Temp (24hrs), Avg:97.9 F (36.6 C), Min:97.1 F (36.2 C), Max:98.6 F (37 C)   Recent Labs Lab 01/10/2016 1501 12/25/2015 1555 01/11/2016 1836 12/31/15 0522 01/01/16 0207 01/03/16 0608 01/04/16 0540 01/04/16 0905  WBC 7.3  --   --  8.0 12.3* 15.2*  --  10.6  CREATININE 1.76*  --   --  0.97 1.14 1.24 1.08  --   LATICACIDVEN  --  2.7* 1.6  --   --   --   --   --     Estimated Creatinine Clearance: 53.5 mL/min (by C-G formula based on Cr of 1.08).    No Known Allergies   Antibiotics:  Ceftriaxone 2/13 >>2/14 Azithromycin 2/13 >>2/14 Vanc 2/15>>2/15 Zosyn 2/15>> Azithromycin 02/16 >>  Microbiology:  02/13 Flu: negative 02/15 MRSA PCR: negative 02/13 Urine cx: negative 02/15 GI panel: negative 02/15 C diff: negative 02/15 Sputum cx: pending 02/15 GPC in 1/4 bottles  Pharmacy will continue to monitor and adjust per consult.  Stormy Card, RPh Clinical Pharmacist  01/04/2016 9:27 AM

## 2016-01-04 NOTE — Progress Notes (Signed)
Patient ID: Larry Pacheco, male   DOB: 1946-02-03, 70 y.o.   MRN: [id number] [facility] PROGRESS NOTE  Larry Pacheco WUJ:[id number] DOB: 08-17-1946 DOA: 01-19-16 PCP: No primary care provider on file.  HPI/Subjective: Patient intubated and sedated.  Objective: Filed Vitals:   01/04/16 1000 01/04/16 1100  BP: 83/58 82/58  Pulse: 59 58  Temp:    Resp: 33 35    Filed Weights   01/02/16 0500 01/03/16 0500 01/04/16 0423  Weight: 53.5 kg (117 lb 15.1 oz) 55 kg (121 lb 4.1 oz) 60.3 kg (132 lb 15 oz)    ROS: Review of Systems  Unable to perform ROS  Exam: Physical Exam  Constitutional: He is intubated.  HENT:  Nose: No mucosal edema.  Unable to look and mouth  Eyes:  Pupils pinpoint  Neck: Normal carotid pulses present. Carotid bruit is not present.  Cardiovascular:  Pulses:      Dorsalis pedis pulses are 1+ on the right side, and 1+ on the left side.  Respiratory: He is intubated. He has decreased breath sounds in the right lower field. He has no wheezes. He has rhonchi in the right lower field and the left lower field.  GI: Soft. Bowel sounds are normal. There is no tenderness.  Musculoskeletal:       Right ankle: He exhibits no swelling.       Left ankle: He exhibits no swelling.  Neurological:  Patient intubated and sedated  Skin: Skin is warm. No rash noted. Nails show no clubbing.  Psychiatric:  Patient intubated      Data Reviewed: Basic Metabolic Panel:  Recent Labs Lab 2016-01-19 1501 12/31/15 0522 01/01/16 0207 01/03/16 9562 01/03/16 2303 01/03/16 2308 01/04/16 0540  NA 133* 139 134* 139  --   --  138  K 3.8 3.8 3.8 3.0* 3.3*  --  3.8  CL 92* 109 103 101  --   --  102  CO2 31 24 21* 30  --   --  35*  GLUCOSE 139* 163* 157* 190*  --   --  190*  BUN 49* 29* 28* 29*  --   --  32*  CREATININE 1.76* 0.97 1.14 1.24  --   --  1.08  CALCIUM 8.4* 7.0* 7.1* 7.1*  --   --  6.7*  MG  --   --   --  2.5*  --   --  2.4  PHOS  --   --   --   --    --  1.8* 3.1   Liver Function Tests:  Recent Labs Lab 01/19/2016 1501 01/03/16 0608  AST 85* 122*  ALT 41 101*  ALKPHOS 58 47  BILITOT 0.6 1.2  PROT 7.2 5.4*  ALBUMIN 3.4* 2.1*   CBC:  Recent Labs Lab 01-19-2016 1501 12/31/15 0522 01/01/16 0207 01/03/16 0608 01/04/16 0905  WBC 7.3 8.0 12.3* 15.2* 10.6  NEUTROABS 5.9  --   --  14.3*  --   HGB 18.0 15.6 13.9 10.3* 8.3*  HCT 53.0* 44.9 40.5 30.8* 24.3*  MCV 87.8 88.3 87.7 89.6 88.6  PLT 196 180 203 288 282   Cardiac Enzymes:  Recent Labs Lab 01-19-2016 1501 19-Jan-2016 1836 01/19/2016 1958 12/31/15 0522  TROPONINI 0.05* 0.06* 0.05* 0.09*   CBG:  Recent Labs Lab 01/03/16 1938 01/03/16 2349 01/04/16 0408 01/04/16 0938 01/04/16 1130  GLUCAP 186* 164* 154* 198* 186*    Recent Results (from the past 240 hour(s))  Rapid Influenza A&B Antigens [facility]  only)     Status: None   Collection Time: 01/26/16  3:01 PM  Result Value Ref Range Status   Influenza A ([facility]) NEGATIVE  Final   Influenza B ([facility]) NEGATIVE  Final  Blood Culture (routine x 2)     Status: None (Preliminary result)   Collection Time: January 26, 2016  3:55 PM  Result Value Ref Range Status   Specimen Description BLOOD RIGHT ASSIST CONTROL  Final   Special Requests   Final    BOTTLES DRAWN AEROBIC AND ANAEROBIC  6CC AERO, 5CC ANA   Culture  Setup Time   Final    GRAM POSITIVE COCCI ANAEROBIC BOTTLE ONLY CRITICAL RESULT CALLED TO, READ BACK BY AND VERIFIED WITH: Ihor Austin 01/02/16 1250PM MLM Organism ID to follow    Culture GRAM POSITIVE COCCI ANAEROBIC BOTTLE ONLY  Final   Report Status PENDING  Incomplete  Blood Culture ID Panel (Reflexed)     Status: None   Collection Time: 2016/01/26  3:55 PM  Result Value Ref Range Status   Enterococcus species NOT DETECTED NOT DETECTED Final   Listeria monocytogenes NOT DETECTED NOT DETECTED Final   Staphylococcus species NOT DETECTED NOT DETECTED Final   Staphylococcus aureus NOT DETECTED NOT DETECTED Final    Streptococcus species NOT DETECTED NOT DETECTED Final   Streptococcus agalactiae NOT DETECTED NOT DETECTED Final   Streptococcus pneumoniae NOT DETECTED NOT DETECTED Final   Streptococcus pyogenes NOT DETECTED NOT DETECTED Final   Acinetobacter baumannii NOT DETECTED NOT DETECTED Final   Enterobacteriaceae species NOT DETECTED NOT DETECTED Final   Enterobacter cloacae complex NOT DETECTED NOT DETECTED Final   Escherichia coli NOT DETECTED NOT DETECTED Final   Klebsiella oxytoca NOT DETECTED NOT DETECTED Final   Klebsiella pneumoniae NOT DETECTED NOT DETECTED Final   Proteus species NOT DETECTED NOT DETECTED Final   Serratia marcescens NOT DETECTED NOT DETECTED Final   Haemophilus influenzae NOT DETECTED NOT DETECTED Final   Neisseria meningitidis NOT DETECTED NOT DETECTED Final   Pseudomonas aeruginosa NOT DETECTED NOT DETECTED Final   Candida albicans NOT DETECTED NOT DETECTED Final   Candida glabrata NOT DETECTED NOT DETECTED Final   Candida krusei NOT DETECTED NOT DETECTED Final   Candida parapsilosis NOT DETECTED NOT DETECTED Final   Candida tropicalis NOT DETECTED NOT DETECTED Final   Carbapenem resistance NOT DETECTED NOT DETECTED Final   Methicillin resistance NOT DETECTED NOT DETECTED Final   Vancomycin resistance NOT DETECTED NOT DETECTED Final  Blood Culture (routine x 2)     Status: None   Collection Time: Jan 26, 2016  4:01 PM  Result Value Ref Range Status   Specimen Description BLOOD LEFT ASSIST CONTROL  Final   Special Requests   Final    BOTTLES DRAWN AEROBIC AND ANAEROBIC  3CC AERO, 2CC ANA   Culture NO GROWTH 5 DAYS  Final   Report Status 01/04/2016 FINAL  Final  Urine culture     Status: None   Collection Time: 01-26-2016  6:36 PM  Result Value Ref Range Status   Specimen Description URINE, RANDOM  Final   Special Requests NONE  Final   Culture NO GROWTH 1 DAY  Final   Report Status 01/01/2016 FINAL  Final  MRSA PCR Screening     Status: None   Collection Time:  01/01/16  2:08 AM  Result Value Ref Range Status   MRSA by PCR NEGATIVE NEGATIVE Final    Comment:        The GeneXpert MRSA Assay (FDA approved  for NASAL specimens only), is one component of a comprehensive MRSA colonization surveillance program. It is not intended to diagnose MRSA infection nor to guide or monitor treatment for MRSA infections.   Gastrointestinal Panel by PCR , Stool     Status: None   Collection Time: 01/01/16  6:59 AM  Result Value Ref Range Status   Campylobacter species NOT DETECTED NOT DETECTED Final   Plesimonas shigelloides NOT DETECTED NOT DETECTED Final   Salmonella species NOT DETECTED NOT DETECTED Final   Yersinia enterocolitica NOT DETECTED NOT DETECTED Final   Vibrio species NOT DETECTED NOT DETECTED Final   Vibrio cholerae NOT DETECTED NOT DETECTED Final   Enteroaggregative E coli (EAEC) NOT DETECTED NOT DETECTED Final   Enteropathogenic E coli (EPEC) NOT DETECTED NOT DETECTED Final   Enterotoxigenic E coli (ETEC) NOT DETECTED NOT DETECTED Final   Shiga like toxin producing E coli (STEC) NOT DETECTED NOT DETECTED Final   E. coli O157 NOT DETECTED NOT DETECTED Final   Shigella/Enteroinvasive E coli (EIEC) NOT DETECTED NOT DETECTED Final   Cryptosporidium NOT DETECTED NOT DETECTED Final   Cyclospora cayetanensis NOT DETECTED NOT DETECTED Final   Entamoeba histolytica NOT DETECTED NOT DETECTED Final   Giardia lamblia NOT DETECTED NOT DETECTED Final   Adenovirus F40/41 NOT DETECTED NOT DETECTED Final   Astrovirus NOT DETECTED NOT DETECTED Final   Norovirus GI/GII NOT DETECTED NOT DETECTED Final   Rotavirus A NOT DETECTED NOT DETECTED Final   Sapovirus (I, II, IV, and V) NOT DETECTED NOT DETECTED Final  C difficile quick scan w PCR reflex     Status: None   Collection Time: 01/01/16  6:59 AM  Result Value Ref Range Status   C Diff antigen NEGATIVE NEGATIVE Final   C Diff toxin NEGATIVE NEGATIVE Final   C Diff interpretation Negative for C.  difficile  Final  Culture, expectorated sputum-assessment     Status: None   Collection Time: 01/01/16 10:46 AM  Result Value Ref Range Status   Specimen Description SPUTUM  Final   Special Requests Normal  Final   Sputum evaluation THIS SPECIMEN IS ACCEPTABLE FOR SPUTUM CULTURE  Final   Report Status 01/01/2016 FINAL  Final  Culture, respiratory (NON-Expectorated)     Status: None   Collection Time: 01/01/16 10:46 AM  Result Value Ref Range Status   Specimen Description SPUTUM  Final   Special Requests Normal Reflexed from [id number]  Final   Gram Stain   Final    FAIR SPECIMEN - 70-80% WBCS FEW SQUAMOUS EPITHELIAL CELLS PRESENT MODERATE WBC SEEN MODERATE YEAST FEW GRAM NEGATIVE RODS RARE GRAM POSITIVE COCCI IN PAIRS    Culture LIGHT GROWTH CANDIDA ALBICANS  Final   Report Status 01/04/2016 FINAL  Final  Fungus Culture with Smear     Status: None (Preliminary result)   Collection Time: 01/02/16  2:52 PM  Result Value Ref Range Status   Specimen Description BRONCHIAL WASHINGS  Final   Special Requests Normal  Final   Culture YEAST IDENTIFICATION TO FOLLOW   Final   Report Status PENDING  Incomplete  Culture, bal-quantitative     Status: None (Preliminary result)   Collection Time: 01/02/16  2:52 PM  Result Value Ref Range Status   Specimen Description BRONCHIAL WASHINGS  Final   Special Requests Normal  Final   Gram Stain   Final    FAIR SPECIMEN - 70-80% WBCS FEW SQUAMOUS EPITHELIAL CELLS PRESENT MODERATE WBC SEEN MANY YEAST    Culture  Final    HEAVY GROWTH YEAST IDENTIFICATION TO FOLLOW ONCE BETTER GROWTH    Report Status PENDING  Incomplete     Studies: Ct Abdomen Pelvis W Contrast  01/03/2016  CLINICAL DATA:  Shortness of breath, abdominal distension EXAM: CT ABDOMEN AND PELVIS WITH CONTRAST TECHNIQUE: Multidetector CT imaging of the abdomen and pelvis was performed using the standard protocol following bolus administration of intravenous contrast. CONTRAST:   OMNIPAQUE IOHEXOL 300 MG/ML  SOLN COMPARISON:  CT chest 01/14/2016 FINDINGS: Images of the lung bases shows bilateral lower lobe infiltrates suspicious for pneumonia. Small bilateral pleural effusion. There is NG tube with tip in distal stomach. Small hiatal hernia is noted. Heart size within normal limits. Mild fatty infiltration of the liver.  No focal hepatic mass. No calcified gallstones are noted within gallbladder. Enhanced pancreas, spleen and adrenal glands are unremarkable. Kidneys are symmetrical in size and enhancement. Small right perinephric fluid. No aortic aneurysm. Mild atherosclerotic calcifications of distal abdominal aorta and common iliac arteries. There is no small bowel obstruction. No ascites or free air. No adenopathy. The terminal ileum is unremarkable. Normal appendix partially visualized in axial image 41. Distended urinary bladder is noted. Delayed renal images shows shows no significant excretion bilaterally. Please correlate with renal function. Oral contrast material noted in right colon. Moderate colonic gas and contrast material noted within transverse colon. There is no colitis or diverticulitis. Descending colon and sigmoid colon is empty partially collapsed. There is a right inguinal canal hernia containing fat measures 1.8 cm. Left inguinal canal hernia containing fat measures 3.2 cm. There is no evidence of acute complication. Pelvic phleboliths are noted. Mild distended urinary bladder. Prostate gland and seminal vesicles are unremarkable. IMPRESSION: 1. Extensive bilateral lower lobe infiltrates are noted highly suspicious for bilateral pneumonia. Small bilateral pleural effusion. 2. There is NG tube in place.  Small hiatal hernia. 3. Fatty infiltration of the liver is noted. 4. There is small amount of right perinephric fluid. Trace left perinephric stranding. Delayed renal images shows no significant excretion. Please correlate with renal function. 5. No small bowel or  colonic obstruction. 6. No pericecal inflammation. Normal appendix. Contrast material is noted in right colon. Moderate gas and contrast material noted within transverse colon. 7. Bilateral inguinal hernia containing fat without evidence of acute complication. Electronically Signed   By: Natasha Mead M.D.   On: 01/03/2016 16:50   Dg Chest Port 1 View  01/04/2016  CLINICAL DATA:  70 year old male with respiratory failure requiring intubation EXAM: PORTABLE CHEST 1 VIEW COMPARISON:  Prior chest x-ray 01/02/2016 FINDINGS: The patient is intubated. The tip of the endotracheal tube is 5.5 cm above the carina. Right IJ approach central venous catheter the tip of which overlies the mid SVC. A nasogastric tube is present. The tip of the tube is not identified but lies below the diaphragm, presumably within the stomach. Increasing diffuse bilateral interstitial and airspace opacities in a primarily central distribution. Slightly increased fluid within the minor fissure on the right with a small layering right-sided pleural effusion. No pneumothorax. No acute osseous abnormality. IMPRESSION: 1. Increasing bilateral interstitial and airspace opacities favored to reflect worsening pulmonary edema. 2. Developing right-sided pleural effusion with small volume fluid tracking into the minor fissure. 3. Stable and satisfactory support apparatus. Electronically Signed   By: Malachy Moan M.D.   On: 01/04/2016 07:57   Dg Chest Port 1 View  01/02/2016  CLINICAL DATA:  70 year old male with respiratory failure admitted on 02/13 with shortness  of breath, cough and altered mental status EXAM: PORTABLE CHEST 1 VIEW COMPARISON:  Most recent prior chest x-ray 01/01/2016; CT scan of the chest Jan 12, 2016 FINDINGS: The patient is intubated. The tip of the endotracheal tube is 4.3 cm above the carina. Right IJ approach central venous catheter. The tip overlies the mid SVC. No evidence of pneumothorax or other complication. Stable cardiac  and mediastinal contours. Slightly increased bilateral patchy interstitial and airspace opacities in a relatively perihilar distribution. No pneumothorax. No acute osseous abnormality. IMPRESSION: 1. Interval intubation. The tip of the endotracheal tube is 4.3 cm above the carina. 2. Right IJ approach central venous catheter with the tip overlying the mid SVC. No evidence of pneumothorax or other complicating feature. 3. Slightly increased bilateral perihilar interstitial and airspace opacities which may reflect developing noncardiogenic edema superimposed on the background pulmonary process. Alternately, this could represent progression of the underlying pulmonary process. Electronically Signed   By: Malachy Moan M.D.   On: 01/02/2016 16:44   Dg Abd Portable 1v  01/03/2016  CLINICAL DATA:  NG tube placement EXAM: PORTABLE ABDOMEN - 1 VIEW COMPARISON:  None. FINDINGS: Enteric tube tip is in the left upper quadrant consistent with location in the body of the stomach. Gas-filled nondistended colon suggesting ileus. IMPRESSION: Enteric tube tip is in the left upper quadrant consistent with location in the body of the stomach. Electronically Signed   By: Burman Nieves M.D.   On: 01/03/2016 03:01    Scheduled Meds: . amLODipine  5 mg Oral Daily  . antiseptic oral rinse  7 mL Mouth Rinse QID  . azithromycin  500 mg Intravenous Q24H  . budesonide (PULMICORT) nebulizer solution  0.25 mg Nebulization BID  . chlorhexidine gluconate  15 mL Mouth Rinse BID  . feeding supplement (PRO-STAT SUGAR FREE 64)  30 mL Per Tube BID  . free water  200 mL Per Tube 3 times per day  . haloperidol lactate  2 mg Intravenous Once  . heparin subcutaneous  5,000 Units Subcutaneous 3 times per day  . insulin aspart  0-15 Units Subcutaneous 6 times per day  . methylPREDNISolone (SOLU-MEDROL) injection  40 mg Intravenous Q12H  . metoprolol tartrate  25 mg Oral BID  . pantoprazole (PROTONIX) IV  40 mg Intravenous Q24H  .  piperacillin-tazobactam  3.375 g Intravenous 3 times per day  . polyethylene glycol  17 g Per NG tube Daily   Continuous Infusions: . dexmedetomidine 0.6 mcg/kg/hr (01/04/16 1045)  . feeding supplement (VITAL AF 1.2 CAL) 1,000 mL (01/04/16 1123)  . fentaNYL infusion INTRAVENOUS 400 mcg/hr (01/04/16 1123)  . norepinephrine (LEVOPHED) Adult infusion    . propofol (DIPRIVAN) infusion 8 mcg/kg/min (01/04/16 0909)    Assessment/Plan:  1. Clinical sepsis with pneumonia bilaterally. Patient is on Zosyn and Zithromax. Add one dose of vancomycin at this point, since blood culture shows gram-positive cocci in anaerobic bottle. I spoke with the microbiology lab they will have an identification of this tomorrow. It could be a contaminant since it's only in one bottle. 2. Septic shock- levophed needed be started for hypotension 3. Acute respiratory failure with hypoxia. Secondary to rapidly progressing pneumonia. Bronchoscopy showing yeast. One blood culture positive with gram-positive cocci. Patient on 100% FiO2. Sent off anca and ana profiles. 4. GI bleed with drop in hemoglobin. Likely will end up needing a blood transfusion during this hospital course. Patient is on Protonix. 5. Nutrition continue tube feeds  Code Status:     Code Status Orders  Start     Ordered   12/26/2015 1823  Full code   Continuous     12/28/2015 1823    Code Status History    Date Active Date Inactive Code Status Order ID Comments User Context   This patient has a current code status but no historical code status.    Advance Directive Documentation        Most Recent Value   Type of Advance Directive  Living will   Pre-existing out of facility DNR order (yellow form or pink MOST form)     "MOST" Form in Place?       Family Communication: Spoke with sister on the phone. She is on her way to the hospital and should be here later on this evening Disposition Plan: To be determined  Consultants:  Critical  care specialist  gastroenterologist  Procedures:  Intubation  bronchoscopy  Antibiotics:  Zosyn    Zithromax   Time spent: 35 minutes critical care time  Alford Highland  [facility]

## 2016-01-04 NOTE — Progress Notes (Signed)
eLink Physician-Brief Progress Note Patient Name: Larry Pacheco DOB: [birth year] MRN: [id number]   Date of Service  01/04/2016  HPI/Events of Note  Sats remain in the mid-80s.  RR is 28 with set rate of 15.  On camera check patient is alert.  eICU Interventions  Suspect inadequate sedation Change from PRN fentanyl to fentanyl gtt.     Intervention Category Major Interventions: Other:  Sherelle Castelli 01/04/2016, 3:52 AM

## 2016-01-04 NOTE — Progress Notes (Signed)
Nutrition Follow-up   INTERVENTION:   EN: recommend initial goal rate of 40 ml/hr with Prostat BID, additional kcals from diprivan. Meets 100% of estimated calorie and protein needs based on ASPEN recommendations. Continue to assess  NUTRITION DIAGNOSIS:   Inadequate oral intake related to acute illness as evidenced by meal completion < 50%. Being addressed via TF on vent  GOAL:   Patient will meet greater than or equal to 90% of their needs  MONITOR:    (Energy Intake, Anthropometrics, Digestive System)  REASON FOR ASSESSMENT:   Consult Enteral/tube feeding initiation and management  ASSESSMENT:    Pt remains on vent, requiring 100% FiO2  Diet Order:  Diet NPO time specified   EN: tolerating Vital 1.2 at rate of 20 ml/hr  Skin:  Reviewed, no issues  Digestive System: abdomen remains distended but soft,  BS active, +loose dark stool, CT abdomen yesterday with no signs of obstruction, GI following, no active bleeding at present  Recent Labs Lab 01/01/16 0207 01/03/16 0608 01/03/16 2303 01/03/16 2308 01/04/16 0540  NA 134* 139  --   --  138  K 3.8 3.0* 3.3*  --  3.8  CL 103 101  --   --  102  CO2 21* 30  --   --  35*  BUN 28* 29*  --   --  32*  CREATININE 1.14 1.24  --   --  1.08  CALCIUM 7.1* 7.1*  --   --  6.7*  MG  --  2.5*  --   --  2.4  PHOS  --   --   --  1.8* 3.1  GLUCOSE 157* 190*  --   --  190*    Glucose Profile:  Recent Labs  01/03/16 2349 01/04/16 0408 01/04/16 0938  GLUCAP 164* 154* 198*   Nutritional Anemia Profile:  CBC Latest Ref Rng 01/04/2016 01/03/2016 01/01/2016  WBC 3.8 - 10.6 K/uL 10.6 15.2(H) 12.3(H)  Hemoglobin 13.0 - 18.0 g/dL 8.3(L) 10.3(L) 13.9  Hematocrit 40.0 - 52.0 % 24.3(L) 30.8(L) 40.5  Platelets 150 - 440 K/uL 282 288 203    Meds: 1/2 NS at 75 ml/hr, solumedrol, diprivan (started this AM, currently at 2.9 ml/hr-estimated 77 kcals in 24 hours if no adjustment), ss novolog  Height:   Ht Readings from Last 1  Encounters:  12/18/2015  (1.676 m)    Weight:   Wt Readings from Last 1 Encounters:  01/04/16 132 lb 15 oz (60.3 kg)    Filed Weights   01/02/16 0500 01/03/16 0500 01/04/16 0423  Weight: 117 lb 15.1 oz (53.5 kg) 121 lb 4.1 oz (55 kg) 132 lb 15 oz (60.3 kg)    BMI:  Body mass index is 21.47 kg/(m^2).  Estimated Nutritional Needs:   Kcal:  1468 kcals (BEE 1243, Ve: 5.1, Tmax: 37.9) using wt of 55 kg  Protein:  83-110 g (1.5-2.0 g/kg)   Fluid:  1375-1650 mL (25-30 ml/kg)   HIGH Care Level  Romelle Starcher MS, RD, LDN [phone] Pager  [phone] Weekend/On-Call Pager

## 2016-01-04 NOTE — Progress Notes (Signed)
eLink Physician-Brief Progress Note Patient Name: Anastasios Melander DOB: 06-Apr-1946 MRN: [id number]   Date of Service  01/04/2016  HPI/Events of Note  Patient with drop in sats to the mid-80s with RR in the 30s to 40s on precedex at 0.8 mcg.  Had been on fentanyl gtt but this was d/ced today.  eICU Interventions  Plan: Continue with precedex - titate to max dose if needed Increase in PRN fentanyl to 25 to 75 mcg q1 hour prn to improved sedation and reduce RR.  This should improve saturation. Goal sat level of 88% or higher     Intervention Category Intermediate Interventions: Respiratory distress - evaluation and management  DETERDING,ELIZABETH 01/04/2016, 1:16 AM

## 2016-01-04 NOTE — Progress Notes (Signed)
PULMONARY / CRITICAL CARE MEDICINE   Name: Broxton Broady MRN: [id number] DOB: Aug 08, 1946    ADMISSION DATE:  01/29/16  BRIEF HISTORY: 70 year old male with no stomach and past medical history, admitted on 12/30/2011 for shortness of breath, and intermittent hemoptysis, started experiencing shortness of hemoptysis along with cough about one week ago after going out to the [place] to bird watch. Nonsmoker, states he is in good health otherwise. CTA chest negative for pulmonary embolus. Acute decline in respiratory status from nasal cannula to high flow oxygen to requiring intubation.  SUBJECTIVE:  Vent dyssynchrony and low oxygen saturation overnight. PEEP increased to 14, RR to 14 and FiO2 to 110%. Also given one dose of paralytic and sedation increased. Now improved.   STUDIES:  CTA chest 2/12>> no PE, multilobar basilar pneumonia, mildly tight findings, pneumonitis  CXR images reviewed, slightly increased opacification, likely a pulmonary edema.  SIGNIFICANT EVENTS: 2/16>> worsening hypoxia requiring high flow nasal cannula, unable to wean, subsequently intubated  VITAL SIGNS: Temp:  [97.1 F (36.2 C)-98.6 F (37 C)] 98.6 F (37 C) (02/18 0400) Pulse Rate:  [68-98] 96 (02/18 0600) Resp:  [15-33] 16 (02/18 0600) BP: (78-137)/(55-86) 137/86 mmHg (02/18 0600) SpO2:  [85 %-97 %] 92 % (02/18 0600) FiO2 (%):  [60 %-100 %] 100 % (02/18 0819) Weight:  [132 lb 15 oz (60.3 kg)] 132 lb 15 oz (60.3 kg) (02/18 0423) HEMODYNAMICS: CVP:  [6 mmHg-13 mmHg] 11 mmHg VENTILATOR SETTINGS: Vent Mode:  [-] PRVC FiO2 (%):  [60 %-100 %] 100 % Set Rate:  [15 bmp-20 bmp] 20 bmp Vt Set:  [390 mL] 390 mL PEEP:  [8 cmH20-14 cmH20] 14 cmH20 Plateau Pressure:  [16 cmH20-28 cmH20] 28 cmH20 INTAKE / OUTPUT:  Intake/Output Summary (Last 24 hours) at 01/04/16 0911 Last data filed at 01/04/16 4098  Gross per 24 hour  Intake 2577.13 ml  Output   1412 ml  Net 1165.13 ml    Review of Systems  Unable to  perform ROS: intubated    Physical Exam  Constitutional: He is well-developed, well-nourished, and in no distress.  HENT:  Head: Normocephalic.  Right Ear: External ear normal.  Left Ear: External ear normal.  Nose: Nose normal.  Mouth/Throat: Oropharynx is clear and moist.  Eyes: Pupils are equal, round, and reactive to light.  Neck: Normal range of motion. Neck supple. No tracheal deviation present. No thyromegaly present.  Cardiovascular: Regular rhythm, normal heart sounds and intact distal pulses.   No murmur heard. Pulmonary/Chest: He has no wheezes. He has no rales. He exhibits no tenderness.  No distress on the vent.  No wheezes Breath sounds diminished in the bases  Abdominal: Bowel sounds are normal.  Faint BS, moderately firm abdomen.   Musculoskeletal: Normal range of motion. He exhibits no edema or tenderness.  Neurological: He is alert.  Moderately awake on the vent, following simple commands.   Skin: Skin is warm and dry. He is not diaphoretic.  Nursing note and vitals reviewed.    LABS:  CBC  Recent Labs Lab 12/31/15 0522 01/01/16 0207 01/03/16 0608  WBC 8.0 12.3* 15.2*  HGB 15.6 13.9 10.3*  HCT 44.9 40.5 30.8*  PLT 180 203 288   Coag's  Recent Labs Lab 01/29/16 1501  INR 1.01   BMET  Recent Labs Lab 01/01/16 0207 01/03/16 0608 01/03/16 2303 01/04/16 0540  NA 134* 139  --  138  K 3.8 3.0* 3.3* 3.8  CL 103 101  --  102  CO2 21*  30  --  35*  BUN 28* 29*  --  32*  CREATININE 1.14 1.24  --  1.08  GLUCOSE 157* 190*  --  190*   Electrolytes  Recent Labs Lab 01/01/16 0207 01/03/16 0608 01/03/16 2308 01/04/16 0540  CALCIUM 7.1* 7.1*  --  6.7*  MG  --  2.5*  --  2.4  PHOS  --   --  1.8* 3.1   Sepsis Markers  Recent Labs Lab 01/13/2016 1555 01/11/2016 1836  LATICACIDVEN 2.7* 1.6   ABG  Recent Labs Lab 01/02/16 1715 01/03/16 0917 01/04/16 0630  PHART 7.47* 7.43 7.39  PCO2ART 46 53* 52*  PO2ART 50* 51* 55*   Liver  Enzymes  Recent Labs Lab 12/24/2015 1501 01/03/16 0608  AST 85* 122*  ALT 41 101*  ALKPHOS 58 47  BILITOT 0.6 1.2  ALBUMIN 3.4* 2.1*   Cardiac Enzymes  Recent Labs Lab 01/06/2016 1836 12/19/2015 1958 12/31/15 0522  TROPONINI 0.06* 0.05* 0.09*   Glucose  Recent Labs Lab 01/01/16 0157 01/03/16 1827 01/03/16 1938 01/03/16 2349 01/04/16 0408  GLUCAP 174* 209* 186* 164* 154*    Imaging Ct Abdomen Pelvis W Contrast  01/03/2016  CLINICAL DATA:  Shortness of breath, abdominal distension EXAM: CT ABDOMEN AND PELVIS WITH CONTRAST TECHNIQUE: Multidetector CT imaging of the abdomen and pelvis was performed using the standard protocol following bolus administration of intravenous contrast. CONTRAST:  OMNIPAQUE IOHEXOL 300 MG/ML  SOLN COMPARISON:  CT chest 01/12/2016 FINDINGS: Images of the lung bases shows bilateral lower lobe infiltrates suspicious for pneumonia. Small bilateral pleural effusion. There is NG tube with tip in distal stomach. Small hiatal hernia is noted. Heart size within normal limits. Mild fatty infiltration of the liver.  No focal hepatic mass. No calcified gallstones are noted within gallbladder. Enhanced pancreas, spleen and adrenal glands are unremarkable. Kidneys are symmetrical in size and enhancement. Small right perinephric fluid. No aortic aneurysm. Mild atherosclerotic calcifications of distal abdominal aorta and common iliac arteries. There is no small bowel obstruction. No ascites or free air. No adenopathy. The terminal ileum is unremarkable. Normal appendix partially visualized in axial image 41. Distended urinary bladder is noted. Delayed renal images shows shows no significant excretion bilaterally. Please correlate with renal function. Oral contrast material noted in right colon. Moderate colonic gas and contrast material noted within transverse colon. There is no colitis or diverticulitis. Descending colon and sigmoid colon is empty partially collapsed.  There is a right inguinal canal hernia containing fat measures 1.8 cm. Left inguinal canal hernia containing fat measures 3.2 cm. There is no evidence of acute complication. Pelvic phleboliths are noted. Mild distended urinary bladder. Prostate gland and seminal vesicles are unremarkable. IMPRESSION: 1. Extensive bilateral lower lobe infiltrates are noted highly suspicious for bilateral pneumonia. Small bilateral pleural effusion. 2. There is NG tube in place.  Small hiatal hernia. 3. Fatty infiltration of the liver is noted. 4. There is small amount of right perinephric fluid. Trace left perinephric stranding. Delayed renal images shows no significant excretion. Please correlate with renal function. 5. No small bowel or colonic obstruction. 6. No pericecal inflammation. Normal appendix. Contrast material is noted in right colon. Moderate gas and contrast material noted within transverse colon. 7. Bilateral inguinal hernia containing fat without evidence of acute complication. Electronically Signed   By: Natasha Mead M.D.   On: 01/03/2016 16:50   Dg Chest Port 1 View  01/04/2016  CLINICAL DATA:  70 year old male with respiratory failure requiring intubation EXAM:  PORTABLE CHEST 1 VIEW COMPARISON:  Prior chest x-ray 01/02/2016 FINDINGS: The patient is intubated. The tip of the endotracheal tube is 5.5 cm above the carina. Right IJ approach central venous catheter the tip of which overlies the mid SVC. A nasogastric tube is present. The tip of the tube is not identified but lies below the diaphragm, presumably within the stomach. Increasing diffuse bilateral interstitial and airspace opacities in a primarily central distribution. Slightly increased fluid within the minor fissure on the right with a small layering right-sided pleural effusion. No pneumothorax. No acute osseous abnormality. IMPRESSION: 1. Increasing bilateral interstitial and airspace opacities favored to reflect worsening pulmonary edema. 2.  Developing right-sided pleural effusion with small volume fluid tracking into the minor fissure. 3. Stable and satisfactory support apparatus. Electronically Signed   By: Malachy Moan M.D.   On: 01/04/2016 07:57    Cultures: BCx2 2/13>> UC  Sputum 2/13>> BAL 2/16>> Influenza negative.  Cdiff negative Hemoccult POSITIVE  Antibiotics: Zosyn 2/15>> Vanc 2/15>> Lines:  ASSESSMENT / PLAN: 70 year old male no significant past medical history now with multifocal pneumonia, cough, dyspnea, accidental fall, requiring supplemental oxygen, chest CT concerning for interstitial lung disease versus multifocal pneumonia  A: PULMONARY VDRF Bilateral basilar pneumonia/multifocal pneumonia-Chest x-ray with persistent bilateral lung opacities Pulmonary edema Hypoxia Dyspnea Hemoptysis-resolved; no further episodes noted; no blood in airway during bronchoscopy P:  -Intubated and sedated, continue mechanical ventilation, wean as tolerated -Maintain SPO2 greater than 88% -CXR, pulmonary edema, patient was given dose of Lasix. -Continue with steroids and antibiotics for now. Will hold off on taper due to worsening CXR  -Could be a hypersensitivity type reaction -VAP prevention -Bal cultures, thus far showing Candida, suspect contaminant.  A: CARDIOVASCULAR Elevated troponin Hypertension P:  Continued monitoring Continue with home meds MAP>65  A: RENAL ICU electrolyte replacement protocol. -Renal function is improved.  A: GASTROINTESTINAL A: Melanotic Stools Hemoccult positive P: Proton pump inhibitor for stress ulcer prophylaxis Melanotic stools, firm abdomen-CT abdomen and pelvis   A: HEMATOLOGIC Monitor CBC  INFECTIOUS Possible multifocal/bilateral pneumonia Continue current antibiotics    A: ENDOCRINE ICU hypo/hyperglycemia protocol  A: NEUROLOGIC Accidental fall Confusion - improving P:  RASS goal: 0 CT head negative for acute  hemorrhage   Social   Disposition- No weaning today. Continued respiratory failure.  [facility] ANP-BC Pulmonary and Critical Care Medicine [facility] Pager [phone]  Wells Guiles, M.D.  Patient seen and examined with the nurse practitioner, was present for the history and physical as well as a formulation of the assessment and plan.  Critical Care Attestation.  I have personally obtained a history, examined the patient, evaluated laboratory and imaging results, formulated the assessment and plan and placed orders. The Patient requires high complexity decision making for assessment and support, frequent evaluation and titration of therapies, application of advanced monitoring technologies and extensive interpretation of multiple databases. The patient has critical illness that could lead imminently to failure of 1 or more organ systems and requires the highest level of physician preparedness to intervene.  Critical Care Time devoted to patient care services described in this note is 35 minutes and is exclusive of time spent in procedures.

## 2016-01-04 NOTE — Progress Notes (Addendum)
Spoke w/ sister Diane updated on pt.'s status.  Sister will be in tomorrow afternoon and will be wanting to speak with a doctor, particularly GI - Dr. Bluford Kaufmann.

## 2016-01-04 NOTE — Progress Notes (Signed)
Attempted fio2 wean. Patient not tolerating at this time. Desaturation with movement to bedpan. Will continue to monitor. Oral subglottic suction 120 mmhg

## 2016-01-04 NOTE — Progress Notes (Signed)
eLink Physician-Brief Progress Note Patient Name: Hommer Cunliffe DOB: 24-Jul-1946 MRN: [id number]   Date of Service  01/04/2016  HPI/Events of Note  Order for 0.45 NaCl IV fluid has expired.   eICU Interventions  Will reorder 0.45 NaCl to run IV at 75 mL/hour.      Intervention Category Minor Interventions: Routine modifications to care plan (e.g. PRN medications for pain, fever)  Sommer,Steven Eugene 01/04/2016, 6:59 PM

## 2016-01-05 ENCOUNTER — Inpatient Hospital Stay: Payer: Medicare Other

## 2016-01-05 LAB — CBC
HEMATOCRIT: 28.2 % — AB (ref 40.0–52.0)
Hemoglobin: 9.7 g/dL — ABNORMAL LOW (ref 13.0–18.0)
MCH: 30.7 pg (ref 26.0–34.0)
MCHC: 34.5 g/dL (ref 32.0–36.0)
MCV: 89 fL (ref 80.0–100.0)
PLATELETS: 414 10*3/uL (ref 150–440)
RBC: 3.17 MIL/uL — ABNORMAL LOW (ref 4.40–5.90)
RDW: 13.9 % (ref 11.5–14.5)
WBC: 17.1 10*3/uL — ABNORMAL HIGH (ref 3.8–10.6)

## 2016-01-05 LAB — BASIC METABOLIC PANEL
Anion gap: 7 (ref 5–15)
BUN: 34 mg/dL — ABNORMAL HIGH (ref 6–20)
CALCIUM: 7 mg/dL — AB (ref 8.9–10.3)
CO2: 30 mmol/L (ref 22–32)
CREATININE: 1.07 mg/dL (ref 0.61–1.24)
Chloride: 100 mmol/L — ABNORMAL LOW (ref 101–111)
GLUCOSE: 249 mg/dL — AB (ref 65–99)
Potassium: 2.9 mmol/L — CL (ref 3.5–5.1)
Sodium: 137 mmol/L (ref 135–145)

## 2016-01-05 LAB — BLOOD GAS, ARTERIAL
ACID-BASE EXCESS: 6.1 mmol/L — AB (ref 0.0–3.0)
ALLENS TEST (PASS/FAIL): POSITIVE — AB
Bicarbonate: 31.2 mEq/L — ABNORMAL HIGH (ref 21.0–28.0)
FIO2: 0.7
MECHVT: 390 mL
Mechanical Rate: 20
O2 Saturation: 86.5 %
Patient temperature: 37
RATE: 20 resp/min
pCO2 arterial: 46 mmHg (ref 32.0–48.0)
pH, Arterial: 7.44 (ref 7.350–7.450)
pO2, Arterial: 50 mmHg — ABNORMAL LOW (ref 83.0–108.0)

## 2016-01-05 LAB — TYPE AND SCREEN
ABO/RH(D): A POS
ANTIBODY SCREEN: NEGATIVE

## 2016-01-05 LAB — GLUCOSE, CAPILLARY
GLUCOSE-CAPILLARY: 102 mg/dL — AB (ref 65–99)
GLUCOSE-CAPILLARY: 188 mg/dL — AB (ref 65–99)
GLUCOSE-CAPILLARY: 234 mg/dL — AB (ref 65–99)
GLUCOSE-CAPILLARY: 239 mg/dL — AB (ref 65–99)
GLUCOSE-CAPILLARY: 282 mg/dL — AB (ref 65–99)
Glucose-Capillary: 108 mg/dL — ABNORMAL HIGH (ref 65–99)

## 2016-01-05 LAB — CULTURE, BLOOD (ROUTINE X 2)

## 2016-01-05 LAB — MAGNESIUM: Magnesium: 2.2 mg/dL (ref 1.7–2.4)

## 2016-01-05 LAB — PHOSPHORUS: PHOSPHORUS: 2.1 mg/dL — AB (ref 2.5–4.6)

## 2016-01-05 LAB — ABO/RH: ABO/RH(D): A POS

## 2016-01-05 MED ORDER — DEXTROSE 5 % IV SOLN
30.0000 mmol | Freq: Once | INTRAVENOUS | Status: AC
  Administered 2016-01-05: 30 mmol via INTRAVENOUS
  Filled 2016-01-05: qty 10

## 2016-01-05 MED ORDER — POTASSIUM CHLORIDE 10 MEQ/100ML IV SOLN
10.0000 meq | INTRAVENOUS | Status: AC
  Administered 2016-01-05 (×5): 10 meq via INTRAVENOUS
  Filled 2016-01-05 (×6): qty 100

## 2016-01-05 MED ORDER — DEXTROSE 5 % AND 0.45 % NACL IV BOLUS: 60.0000 mL | Freq: Once | INTRAVENOUS | Status: DC

## 2016-01-05 MED ORDER — LACTULOSE 10 GM/15ML PO SOLN
30.0000 g | Freq: Once | ORAL | Status: AC
  Administered 2016-01-05: 30 g via ORAL
  Filled 2016-01-05: qty 60

## 2016-01-05 MED ORDER — SODIUM CHLORIDE 0.9 % IV SOLN
INTRAVENOUS | Status: DC
  Administered 2016-01-05 – 2016-01-07 (×2): via INTRAVENOUS
  Administered 2016-01-08: 10 mL/h via INTRAVENOUS
  Administered 2016-01-10 – 2016-01-13 (×2): via INTRAVENOUS

## 2016-01-05 NOTE — Progress Notes (Addendum)
Patient ID: Larry Pacheco, male   DOB: May 31, 1946, 70 y.o.   MRN: [id number] [facility] PROGRESS NOTE  [name] WUJ:[id number] DOB: 1946-05-11 DOA: 12/26/2015 PCP: No primary care provider on file.  HPI/Subjective: Patient intubated and on sedation and able to answer some simple yes or no questions by shaking his head.  Objective: Filed Vitals:   01/05/16 0700 01/05/16 0800  BP: 168/97 162/96  Pulse: 116 115  Temp: 97.7 F (36.5 C)   Resp: 26 16    Filed Weights   01/03/16 0500 01/04/16 0423 01/05/16 0500  Weight: 55 kg (121 lb 4.1 oz) 60.3 kg (132 lb 15 oz) 59 kg (130 lb 1.1 oz)    ROS: Review of Systems  Constitutional: Negative for fever.  Cardiovascular: Negative for chest pain.  Gastrointestinal: Negative for abdominal pain.  Musculoskeletal: Negative for joint pain.   Limited secondary to being on the ventilator Exam: Physical Exam  Constitutional: He is intubated.  HENT:  Nose: No mucosal edema.  Unable to look and mouth  Eyes:  Pupils pinpoint  Neck: Normal carotid pulses present. Carotid bruit is not present.  Cardiovascular:  Pulses:      Dorsalis pedis pulses are 1+ on the right side, and 1+ on the left side.  Respiratory: He is intubated. He has decreased breath sounds in the right lower field. He has no wheezes. He has rhonchi in the right lower field and the left lower field.  GI: Soft. Bowel sounds are normal. There is no tenderness.  Musculoskeletal:       Right ankle: He exhibits no swelling.       Left ankle: He exhibits no swelling.  Neurological:  Patient intubated and sedated  Skin: Skin is warm. No rash noted. Nails show no clubbing.  Psychiatric:  Patient intubated      Data Reviewed: Basic Metabolic Panel:  Recent Labs Lab 12/31/15 0522 01/01/16 0207 01/03/16 9562 01/03/16 2303 01/03/16 2308 01/04/16 0540 01/05/16 0426  NA 139 134* 139  --   --  138 137  K 3.8 3.8 3.0* 3.3*  --  3.8 2.9*  CL 109 103 101  --    --  102 100*  CO2 24 21* 30  --   --  35* 30  GLUCOSE 163* 157* 190*  --   --  190* 249*  BUN 29* 28* 29*  --   --  32* 34*  CREATININE 0.97 1.14 1.24  --   --  1.08 1.07  CALCIUM 7.0* 7.1* 7.1*  --   --  6.7* 7.0*  MG  --   --  2.5*  --   --  2.4 2.2  PHOS  --   --   --   --  1.8* 3.1 2.1*   Liver Function Tests:  Recent Labs Lab 12/25/2015 1501 01/03/16 0608  AST 85* 122*  ALT 41 101*  ALKPHOS 58 47  BILITOT 0.6 1.2  PROT 7.2 5.4*  ALBUMIN 3.4* 2.1*   CBC:  Recent Labs Lab 12/24/2015 1501 12/31/15 0522 01/01/16 0207 01/03/16 0608 01/04/16 0905 01/05/16 0426  WBC 7.3 8.0 12.3* 15.2* 10.6 17.1*  NEUTROABS 5.9  --   --  14.3*  --   --   HGB 18.0 15.6 13.9 10.3* 8.3* 9.7*  HCT 53.0* 44.9 40.5 30.8* 24.3* 28.2*  MCV 87.8 88.3 87.7 89.6 88.6 89.0  PLT 196 180 203 288 282 414   Cardiac Enzymes:  Recent Labs Lab 01/08/2016 1501 01/09/2016 1836  12/19/2015 1958 12/31/15 0522  TROPONINI 0.05* 0.06* 0.05* 0.09*   CBG:  Recent Labs Lab 01/04/16 2012 01/04/16 2353 01/05/16 0355 01/05/16 0749 01/05/16 1211  GLUCAP 243* 262* 234* 239* 282*    Recent Results (from the past 240 hour(s))  Rapid Influenza A&B Antigens ([facility] only)     Status: None   Collection Time: 01/13/2016  3:01 PM  Result Value Ref Range Status   Influenza A ([facility]) NEGATIVE  Final   Influenza B ([facility]) NEGATIVE  Final  Blood Culture (routine x 2)     Status: None   Collection Time: 12/26/2015  3:55 PM  Result Value Ref Range Status   Specimen Description BLOOD RIGHT ASSIST CONTROL  Final   Special Requests   Final    BOTTLES DRAWN AEROBIC AND ANAEROBIC  6CC AERO, 5CC ANA   Culture  Setup Time   Final    GRAM POSITIVE COCCI ANAEROBIC BOTTLE ONLY CRITICAL RESULT CALLED TO, READ BACK BY AND VERIFIED WITH: Ihor Austin 01/02/16 1250PM MLM Organism ID to follow    Culture   Final    STAPHYLOCOCCUS SACCHAROLYTICUS ANAEROBIC BOTTLE ONLY Results consistent with contamination.    Report Status  01/05/2016 FINAL  Final  Blood Culture ID Panel (Reflexed)     Status: None   Collection Time: 12/27/2015  3:55 PM  Result Value Ref Range Status   Enterococcus species NOT DETECTED NOT DETECTED Final   Listeria monocytogenes NOT DETECTED NOT DETECTED Final   Staphylococcus species NOT DETECTED NOT DETECTED Final   Staphylococcus aureus NOT DETECTED NOT DETECTED Final   Streptococcus species NOT DETECTED NOT DETECTED Final   Streptococcus agalactiae NOT DETECTED NOT DETECTED Final   Streptococcus pneumoniae NOT DETECTED NOT DETECTED Final   Streptococcus pyogenes NOT DETECTED NOT DETECTED Final   Acinetobacter baumannii NOT DETECTED NOT DETECTED Final   Enterobacteriaceae species NOT DETECTED NOT DETECTED Final   Enterobacter cloacae complex NOT DETECTED NOT DETECTED Final   Escherichia coli NOT DETECTED NOT DETECTED Final   Klebsiella oxytoca NOT DETECTED NOT DETECTED Final   Klebsiella pneumoniae NOT DETECTED NOT DETECTED Final   Proteus species NOT DETECTED NOT DETECTED Final   Serratia marcescens NOT DETECTED NOT DETECTED Final   Haemophilus influenzae NOT DETECTED NOT DETECTED Final   Neisseria meningitidis NOT DETECTED NOT DETECTED Final   Pseudomonas aeruginosa NOT DETECTED NOT DETECTED Final   Candida albicans NOT DETECTED NOT DETECTED Final   Candida glabrata NOT DETECTED NOT DETECTED Final   Candida krusei NOT DETECTED NOT DETECTED Final   Candida parapsilosis NOT DETECTED NOT DETECTED Final   Candida tropicalis NOT DETECTED NOT DETECTED Final   Carbapenem resistance NOT DETECTED NOT DETECTED Final   Methicillin resistance NOT DETECTED NOT DETECTED Final   Vancomycin resistance NOT DETECTED NOT DETECTED Final  Blood Culture (routine x 2)     Status: None   Collection Time: 12/27/2015  4:01 PM  Result Value Ref Range Status   Specimen Description BLOOD LEFT ASSIST CONTROL  Final   Special Requests   Final    BOTTLES DRAWN AEROBIC AND ANAEROBIC  3CC AERO, 2CC ANA   Culture NO  GROWTH 5 DAYS  Final   Report Status 01/04/2016 FINAL  Final  Urine culture     Status: None   Collection Time: 01/07/2016  6:36 PM  Result Value Ref Range Status   Specimen Description URINE, RANDOM  Final   Special Requests NONE  Final   Culture NO GROWTH 1 DAY  Final  Report Status 01/01/2016 FINAL  Final  MRSA PCR Screening     Status: None   Collection Time: 01/01/16  2:08 AM  Result Value Ref Range Status   MRSA by PCR NEGATIVE NEGATIVE Final    Comment:        The GeneXpert MRSA Assay (FDA approved for NASAL specimens only), is one component of a comprehensive MRSA colonization surveillance program. It is not intended to diagnose MRSA infection nor to guide or monitor treatment for MRSA infections.   Gastrointestinal Panel by PCR , Stool     Status: None   Collection Time: 01/01/16  6:59 AM  Result Value Ref Range Status   Campylobacter species NOT DETECTED NOT DETECTED Final   Plesimonas shigelloides NOT DETECTED NOT DETECTED Final   Salmonella species NOT DETECTED NOT DETECTED Final   Yersinia enterocolitica NOT DETECTED NOT DETECTED Final   Vibrio species NOT DETECTED NOT DETECTED Final   Vibrio cholerae NOT DETECTED NOT DETECTED Final   Enteroaggregative E coli (EAEC) NOT DETECTED NOT DETECTED Final   Enteropathogenic E coli (EPEC) NOT DETECTED NOT DETECTED Final   Enterotoxigenic E coli (ETEC) NOT DETECTED NOT DETECTED Final   Shiga like toxin producing E coli (STEC) NOT DETECTED NOT DETECTED Final   E. coli O157 NOT DETECTED NOT DETECTED Final   Shigella/Enteroinvasive E coli (EIEC) NOT DETECTED NOT DETECTED Final   Cryptosporidium NOT DETECTED NOT DETECTED Final   Cyclospora cayetanensis NOT DETECTED NOT DETECTED Final   Entamoeba histolytica NOT DETECTED NOT DETECTED Final   Giardia lamblia NOT DETECTED NOT DETECTED Final   Adenovirus F40/41 NOT DETECTED NOT DETECTED Final   Astrovirus NOT DETECTED NOT DETECTED Final   Norovirus GI/GII NOT DETECTED NOT  DETECTED Final   Rotavirus A NOT DETECTED NOT DETECTED Final   Sapovirus (I, II, IV, and V) NOT DETECTED NOT DETECTED Final  C difficile quick scan w PCR reflex     Status: None   Collection Time: 01/01/16  6:59 AM  Result Value Ref Range Status   C Diff antigen NEGATIVE NEGATIVE Final   C Diff toxin NEGATIVE NEGATIVE Final   C Diff interpretation Negative for C. difficile  Final  Culture, expectorated sputum-assessment     Status: None   Collection Time: 01/01/16 10:46 AM  Result Value Ref Range Status   Specimen Description SPUTUM  Final   Special Requests Normal  Final   Sputum evaluation THIS SPECIMEN IS ACCEPTABLE FOR SPUTUM CULTURE  Final   Report Status 01/01/2016 FINAL  Final  Culture, respiratory (NON-Expectorated)     Status: None   Collection Time: 01/01/16 10:46 AM  Result Value Ref Range Status   Specimen Description SPUTUM  Final   Special Requests Normal Reflexed from [id number]  Final   Gram Stain   Final    FAIR SPECIMEN - 70-80% WBCS FEW SQUAMOUS EPITHELIAL CELLS PRESENT MODERATE WBC SEEN MODERATE YEAST FEW GRAM NEGATIVE RODS RARE GRAM POSITIVE COCCI IN PAIRS    Culture LIGHT GROWTH CANDIDA ALBICANS  Final   Report Status 01/04/2016 FINAL  Final  Fungus Culture with Smear     Status: None (Preliminary result)   Collection Time: 01/02/16  2:52 PM  Result Value Ref Range Status   Specimen Description BRONCHIAL WASHINGS  Final   Special Requests Normal  Final   Culture   Final    CANDIDA ALBICANS HOLDING FOR ADDITIONAL POSSIBLE PATHOGEN    Report Status PENDING  Incomplete  Culture, bal-quantitative     Status:  None (Preliminary result)   Collection Time: 01/02/16  2:52 PM  Result Value Ref Range Status   Specimen Description BRONCHIAL WASHINGS  Final   Special Requests Normal  Final   Gram Stain   Final    FAIR SPECIMEN - 70-80% WBCS FEW SQUAMOUS EPITHELIAL CELLS PRESENT MODERATE WBC SEEN MANY YEAST    Culture   Final    HEAVY GROWTH  YEAST IDENTIFICATION TO FOLLOW ONCE BETTER GROWTH    Report Status PENDING  Incomplete     Studies: Dg Abd 1 View  01/05/2016  CLINICAL DATA:  Sudden onset of vomiting today.  ICU patient. EXAM: ABDOMEN - 1 VIEW COMPARISON:  CT, 01/03/2016 FINDINGS: Normal bowel gas pattern. There is some residual contrast in the colon. Nasal/ orogastric tube projects in the mid stomach, well positioned. No evidence of renal or ureteral stones. Soft tissues are unremarkable. IMPRESSION: 1. No acute findings. No evidence of bowel obstruction. Well-positioned nasal/orogastric tube. Electronically Signed   By: Amie Portland M.D.   On: 01/05/2016 12:15   Ct Abdomen Pelvis W Contrast  01/03/2016  CLINICAL DATA:  Shortness of breath, abdominal distension EXAM: CT ABDOMEN AND PELVIS WITH CONTRAST TECHNIQUE: Multidetector CT imaging of the abdomen and pelvis was performed using the standard protocol following bolus administration of intravenous contrast. CONTRAST:  OMNIPAQUE IOHEXOL 300 MG/ML  SOLN COMPARISON:  CT chest 01/09/2016 FINDINGS: Images of the lung bases shows bilateral lower lobe infiltrates suspicious for pneumonia. Small bilateral pleural effusion. There is NG tube with tip in distal stomach. Small hiatal hernia is noted. Heart size within normal limits. Mild fatty infiltration of the liver.  No focal hepatic mass. No calcified gallstones are noted within gallbladder. Enhanced pancreas, spleen and adrenal glands are unremarkable. Kidneys are symmetrical in size and enhancement. Small right perinephric fluid. No aortic aneurysm. Mild atherosclerotic calcifications of distal abdominal aorta and common iliac arteries. There is no small bowel obstruction. No ascites or free air. No adenopathy. The terminal ileum is unremarkable. Normal appendix partially visualized in axial image 41. Distended urinary bladder is noted. Delayed renal images shows shows no significant excretion bilaterally. Please correlate with  renal function. Oral contrast material noted in right colon. Moderate colonic gas and contrast material noted within transverse colon. There is no colitis or diverticulitis. Descending colon and sigmoid colon is empty partially collapsed. There is a right inguinal canal hernia containing fat measures 1.8 cm. Left inguinal canal hernia containing fat measures 3.2 cm. There is no evidence of acute complication. Pelvic phleboliths are noted. Mild distended urinary bladder. Prostate gland and seminal vesicles are unremarkable. IMPRESSION: 1. Extensive bilateral lower lobe infiltrates are noted highly suspicious for bilateral pneumonia. Small bilateral pleural effusion. 2. There is NG tube in place.  Small hiatal hernia. 3. Fatty infiltration of the liver is noted. 4. There is small amount of right perinephric fluid. Trace left perinephric stranding. Delayed renal images shows no significant excretion. Please correlate with renal function. 5. No small bowel or colonic obstruction. 6. No pericecal inflammation. Normal appendix. Contrast material is noted in right colon. Moderate gas and contrast material noted within transverse colon. 7. Bilateral inguinal hernia containing fat without evidence of acute complication. Electronically Signed   By: Natasha Mead M.D.   On: 01/03/2016 16:50   Dg Chest Port 1 View  01/05/2016  CLINICAL DATA:  Respiratory failure EXAM: PORTABLE CHEST 1 VIEW COMPARISON:  None. FINDINGS: Endotracheal tube, NG tube, right jugular venous catheter are stable. Diffuse airspace disease  is stable. No pneumothorax. No pleural effusion. IMPRESSION: Stable diffuse airspace disease. Electronically Signed   By: Jolaine Click M.D.   On: 01/05/2016 08:57   Dg Chest Port 1 View  01/04/2016  CLINICAL DATA:  70 year old male with respiratory failure requiring intubation EXAM: PORTABLE CHEST 1 VIEW COMPARISON:  Prior chest x-ray 01/02/2016 FINDINGS: The patient is intubated. The tip of the endotracheal tube is  5.5 cm above the carina. Right IJ approach central venous catheter the tip of which overlies the mid SVC. A nasogastric tube is present. The tip of the tube is not identified but lies below the diaphragm, presumably within the stomach. Increasing diffuse bilateral interstitial and airspace opacities in a primarily central distribution. Slightly increased fluid within the minor fissure on the right with a small layering right-sided pleural effusion. No pneumothorax. No acute osseous abnormality. IMPRESSION: 1. Increasing bilateral interstitial and airspace opacities favored to reflect worsening pulmonary edema. 2. Developing right-sided pleural effusion with small volume fluid tracking into the minor fissure. 3. Stable and satisfactory support apparatus. Electronically Signed   By: Malachy Moan M.D.   On: 01/04/2016 07:57    Scheduled Meds: . amLODipine  5 mg Oral Daily  . antiseptic oral rinse  7 mL Mouth Rinse QID  . azithromycin  500 mg Intravenous Q24H  . budesonide (PULMICORT) nebulizer solution  0.25 mg Nebulization BID  . chlorhexidine gluconate  15 mL Mouth Rinse BID  . feeding supplement (PRO-STAT SUGAR FREE 64)  30 mL Per Tube BID  . free water  200 mL Per Tube 3 times per day  . haloperidol lactate  2 mg Intravenous Once  . heparin subcutaneous  5,000 Units Subcutaneous 3 times per day  . insulin aspart  0-15 Units Subcutaneous 6 times per day  . methylPREDNISolone (SOLU-MEDROL) injection  40 mg Intravenous Q12H  . metoprolol tartrate  25 mg Oral BID  . pantoprazole (PROTONIX) IV  40 mg Intravenous Q24H  . piperacillin-tazobactam  3.375 g Intravenous 3 times per day  . polyethylene glycol  17 g Per NG tube Daily   Continuous Infusions: . sodium chloride 75 mL/hr at 01/05/16 0937  . dexmedetomidine Stopped (01/04/16 1400)  . feeding supplement (VITAL AF 1.2 CAL) 1,000 mL (01/05/16 0700)  . fentaNYL infusion INTRAVENOUS 300 mcg/hr (01/05/16 0826)  . norepinephrine (LEVOPHED)  Adult infusion Stopped (01/04/16 2117)  . propofol (DIPRIVAN) infusion 30 mcg/kg/min (01/05/16 1147)    Assessment/Plan:  1. Clinical sepsis with pneumonia bilaterally. Patient is on Zosyn and Zithromax should be finished at this point. Blood culture drawn on admission is a Skin contamination. 2. Septic shock- levophed has been stopped. 3. Acute respiratory failure with hypoxia. Secondary to rapidly progressing pneumonia. Bronchoscopy showing yeast. Patient on 60% FiO2. Sent off anca and ana profiles. 4. GI bleed with drop in hemoglobin. Today's hemoglobin is higher at 9.7. Patient is on Protonix. 5. Nutrition continue tube feeds\ 6. Essential hypertension- blood pressure now elevated after being on the Levophed yesterday and Norvasc was started 7. Hypokalemia- potassium supplementation ordered  Code Status:     Code Status Orders        Start     Ordered   01/01/2016 1823  Full code   Continuous     12/23/2015 1823    Code Status History    Date Active Date Inactive Code Status Order ID Comments User Context   This patient has a current code status but no historical code status.    Advance Directive  Documentation        Most Recent Value   Type of Advance Directive  Living will   Pre-existing out of facility DNR order (yellow form or pink MOST form)     "MOST" Form in Place?       Family Communication: Spoke with sister at the bedside. Disposition Plan: To be determined  Consultants:  Critical care specialist  gastroenterologist  Procedures:  Intubation  bronchoscopy  Antibiotics:  Zosyn   Zithromax (finished)  Time spent: 34 minutes critical care time  Alford Highland  [facility]

## 2016-01-05 NOTE — Progress Notes (Signed)
Pt had another episode of emesis. Large amount of emesis noted. Dr. Laural Benes notified and low intermittent suction requested to help with abd distention. Acknowledged and orders received. Daylan Boggess E 5:52 PM 01/05/2016

## 2016-01-05 NOTE — Progress Notes (Signed)
PARENTERAL NUTRITION CONSULT NOTE - INITIAL  Pharmacy Consult for Electrolyte Monitoring and Replacement Indication: Hypokalemia  No Known Allergies  Patient Measurements: Height:  (167.6 cm) Weight: 130 lb 1.1 oz (59 kg) IBW/kg (Calculated) : 63.8  Vital Signs: Temp: 98.5 F (36.9 C) (02/19 0500) Temp Source: Oral (02/19 0500) BP: 154/92 mmHg (02/19 0300) Pulse Rate: 108 (02/19 0402) Intake/Output from previous day: 02/18 0701 - 02/19 0700 In: 2333.2 [I.V.:1297.1; NG/GT:536.2; IV Piggyback:500] Out: 2680 [Urine:2680] Intake/Output from this shift: Total I/O In: 0  Out: 1800 [Urine:1800]  Labs:  Recent Labs  01/03/16 0608 01/04/16 0905 01/05/16 0426  WBC 15.2* 10.6 17.1*  HGB 10.3* 8.3* 9.7*  HCT 30.8* 24.3* 28.2*  PLT 288 282 414     Recent Labs  01/03/16 0608 01/03/16 2303 01/03/16 2308 01/04/16 0540 01/04/16 0905 01/05/16 0426  NA 139  --   --  138  --  137  K 3.0* 3.3*  --  3.8  --  2.9*  CL 101  --   --  102  --  100*  CO2 30  --   --  35*  --  30  GLUCOSE 190*  --   --  190*  --  249*  BUN 29*  --   --  32*  --  34*  CREATININE 1.24  --   --  1.08  --  1.07  CALCIUM 7.1*  --   --  6.7*  --  7.0*  MG 2.5*  --   --  2.4  --  2.2  PHOS  --   --  1.8* 3.1  --  2.1*  PROT 5.4*  --   --   --   --   --   ALBUMIN 2.1*  --   --   --   --   --   AST 122*  --   --   --   --   --   ALT 101*  --   --   --   --   --   ALKPHOS 47  --   --   --   --   --   BILITOT 1.2  --   --   --   --   --   TRIG  --   --   --   --  95  --    Estimated Creatinine Clearance: 52.8 mL/min (by C-G formula based on Cr of 1.07).    Recent Labs  01/04/16 2012 01/04/16 2353 01/05/16 0355  GLUCAP 243* 262* 234*    Medical History: History reviewed. No pertinent past medical history.  Medications:  Scheduled:  . amLODipine  5 mg Oral Daily  . antiseptic oral rinse  7 mL Mouth Rinse QID  . azithromycin  500 mg Intravenous Q24H  . budesonide (PULMICORT) nebulizer  solution  0.25 mg Nebulization BID  . chlorhexidine gluconate  15 mL Mouth Rinse BID  . feeding supplement (PRO-STAT SUGAR FREE 64)  30 mL Per Tube BID  . free water  200 mL Per Tube 3 times per day  . haloperidol lactate  2 mg Intravenous Once  . heparin subcutaneous  5,000 Units Subcutaneous 3 times per day  . insulin aspart  0-15 Units Subcutaneous 6 times per day  . methylPREDNISolone (SOLU-MEDROL) injection  40 mg Intravenous Q12H  . metoprolol tartrate  25 mg Oral BID  . pantoprazole (PROTONIX) IV  40 mg Intravenous Q24H  . piperacillin-tazobactam  3.375  g Intravenous 3 times per day  . polyethylene glycol  17 g Per NG tube Daily  . potassium chloride  10 mEq Intravenous Q1 Hr x 6  . potassium phosphate IVPB (mmol)  30 mmol Intravenous Once   Infusions:  . sodium chloride 75 mL/hr at 01/04/16 1916  . dexmedetomidine Stopped (01/04/16 1400)  . feeding supplement (VITAL AF 1.2 CAL) 1,000 mL (01/05/16 0150)  . fentaNYL infusion INTRAVENOUS 300 mcg/hr (01/05/16 0304)  . norepinephrine (LEVOPHED) Adult infusion Stopped (01/04/16 2117)  . propofol (DIPRIVAN) infusion 15 mcg/kg/min (01/05/16 0146)     Assessment: Pharmacy consulted to assist in replacing electrolytes in this 70 y/o M with respiratory failure and PNA. Patient is currently ventilated and sedated.   Plan:  K 2.9, Phos 2.1, Mg 2.2. Replaced by Elink prescriber (potassium chloride 10 mEq IV Q1H x 6 doses and potassium phosphate 30 mmol IV x 1). Will recheck with AM labs.   Carola Frost, Pharm.D., BCPS Clinical Pharmacist 01/05/2016,5:52 AM

## 2016-01-05 NOTE — Progress Notes (Signed)
PULMONARY / CRITICAL CARE MEDICINE   Name: Larry Pacheco MRN: [id number] DOB: 1946/07/01    ADMISSION DATE:  01-28-2016  BRIEF HISTORY: 70 year old male with no stomach and past medical history, admitted on 12/30/2011 for shortness of breath, and intermittent hemoptysis, started experiencing shortness of hemoptysis along with cough about one week ago after going out to the [place] to bird watch. Nonsmoker, states he is in good health otherwise. CTA chest negative for pulmonary embolus. Acute decline in respiratory status from nasal cannula to high flow oxygen to requiring intubation.  SUBJECTIVE:  Improved vent tolerance. Still requiring high FiO2 and an high doses of sedation. Improved vent dyssynchrony. Had one episode of emesis today. 2 feeds on hold. KUB of oral ordered. He nods in disapproval of pain  STUDIES:  CTA chest 2/12>> no PE, multilobar basilar pneumonia, mildly tight findings, pneumonitis  CXR images reviewed, slightly increased opacification, likely a pulmonary edema.  SIGNIFICANT EVENTS: 2/16>> worsening hypoxia requiring high flow nasal cannula, unable to wean, subsequently intubated  VITAL SIGNS: Temp:  [97.5 F (36.4 C)-98.9 F (37.2 C)] 97.7 F (36.5 C) (02/19 0700) Pulse Rate:  [52-130] 115 (02/19 0800) Resp:  [16-42] 16 (02/19 0800) BP: (68-168)/(47-97) 162/96 mmHg (02/19 0800) SpO2:  [84 %-100 %] 92 % (02/19 0800) FiO2 (%):  [60 %-100 %] 60 % (02/19 1214) Weight:  [130 lb 1.1 oz (59 kg)] 130 lb 1.1 oz (59 kg) (02/19 0500) HEMODYNAMICS: CVP:  [10 mmHg-19 mmHg] 13 mmHg VENTILATOR SETTINGS: Vent Mode:  [-] PRVC FiO2 (%):  [60 %-100 %] 60 % Set Rate:  [20 bmp] 20 bmp Vt Set:  [390 mL] 390 mL PEEP:  [14 cmH20] 14 cmH20 Plateau Pressure:  [13 cmH20-28 cmH20] 13 cmH20 INTAKE / OUTPUT:  Intake/Output Summary (Last 24 hours) at 01/05/16 1347 Last data filed at 01/05/16 1341  Gross per 24 hour  Intake 4355.27 ml  Output   3330 ml  Net 1025.27 ml    Review  of Systems  Unable to perform ROS: intubated    Physical Exam  Constitutional: He is well-developed, well-nourished, and in no distress.  HENT:  Head: Normocephalic.  Right Ear: External ear normal.  Left Ear: External ear normal.  Nose: Nose normal.  Mouth/Throat: Oropharynx is clear and moist.  Eyes: Pupils are equal, round, and reactive to light.  Neck: Normal range of motion. Neck supple. No tracheal deviation present. No thyromegaly present.  Cardiovascular: Regular rhythm, normal heart sounds and intact distal pulses.   No murmur heard. Pulmonary/Chest: He has no wheezes. He has no rales. He exhibits no tenderness.  No distress on the vent.  No wheezes Breath sounds diminished in the bases  Abdominal: He exhibits distension.  Hypoactive bowel sounds, firm, no pian with palpation  Musculoskeletal: Normal range of motion. He exhibits no edema or tenderness.  Neurological: He is alert.  Moderately awake on the vent, following simple commands.   Skin: Skin is warm and dry. He is not diaphoretic.  Nursing note and vitals reviewed.    LABS:  CBC  Recent Labs Lab 01/03/16 0608 01/04/16 0905 01/05/16 0426  WBC 15.2* 10.6 17.1*  HGB 10.3* 8.3* 9.7*  HCT 30.8* 24.3* 28.2*  PLT 288 282 414   Coag's  Recent Labs Lab 28-Jan-2016 1501  INR 1.01   BMET  Recent Labs Lab 01/03/16 0608 01/03/16 2303 01/04/16 0540 01/05/16 0426  NA 139  --  138 137  K 3.0* 3.3* 3.8 2.9*  CL 101  --  102 100*  CO2 30  --  35* 30  BUN 29*  --  32* 34*  CREATININE 1.24  --  1.08 1.07  GLUCOSE 190*  --  190* 249*   Electrolytes  Recent Labs Lab 01/03/16 0608 01/03/16 2308 01/04/16 0540 01/05/16 0426  CALCIUM 7.1*  --  6.7* 7.0*  MG 2.5*  --  2.4 2.2  PHOS  --  1.8* 3.1 2.1*   Sepsis Markers  Recent Labs Lab 12/22/2015 1555 12/25/2015 1836  LATICACIDVEN 2.7* 1.6   ABG  Recent Labs Lab 01/03/16 0917 01/04/16 0630 01/05/16 0450  PHART 7.43 7.39 7.44  PCO2ART 53* 52*  46  PO2ART 51* 55* 50*   Liver Enzymes  Recent Labs Lab 01/08/2016 1501 01/03/16 0608  AST 85* 122*  ALT 41 101*  ALKPHOS 58 47  BILITOT 0.6 1.2  ALBUMIN 3.4* 2.1*   Cardiac Enzymes  Recent Labs Lab 01/06/2016 1836 12/21/2015 1958 12/31/15 0522  TROPONINI 0.06* 0.05* 0.09*   Glucose  Recent Labs Lab 01/04/16 1618 01/04/16 2012 01/04/16 2353 01/05/16 0355 01/05/16 0749 01/05/16 1211  GLUCAP 289* 243* 262* 234* 239* 282*    Imaging Dg Abd 1 View  01/05/2016  CLINICAL DATA:  Sudden onset of vomiting today.  ICU patient. EXAM: ABDOMEN - 1 VIEW COMPARISON:  CT, 01/03/2016 FINDINGS: Normal bowel gas pattern. There is some residual contrast in the colon. Nasal/ orogastric tube projects in the mid stomach, well positioned. No evidence of renal or ureteral stones. Soft tissues are unremarkable. IMPRESSION: 1. No acute findings. No evidence of bowel obstruction. Well-positioned nasal/orogastric tube. Electronically Signed   By: Amie Portland M.D.   On: 01/05/2016 12:15   Dg Chest Port 1 View  01/05/2016  CLINICAL DATA:  Respiratory failure EXAM: PORTABLE CHEST 1 VIEW COMPARISON:  None. FINDINGS: Endotracheal tube, NG tube, right jugular venous catheter are stable. Diffuse airspace disease is stable. No pneumothorax. No pleural effusion. IMPRESSION: Stable diffuse airspace disease. Electronically Signed   By: Jolaine Click M.D.   On: 01/05/2016 08:57    Cultures: BCx2 2/13>> UC  Sputum 2/13>> BAL 2/16>> Influenza negative.  Cdiff negative Hemoccult POSITIVE  Antibiotics: Zosyn 2/15>> Vanc 2/15>> Lines:  ASSESSMENT / PLAN: 70 year old male no significant past medical history now with multifocal pneumonia, cough, dyspnea, accidental fall, requiring supplemental oxygen, chest CT concerning for interstitial lung disease versus multifocal pneumonia; now requiring high FiO2 and high doses of sedation   A: PULMONARY VDRF Bilateral basilar pneumonia/multifocal pneumonia-Chest  x-ray with persistent bilateral lung opacities Pulmonary edema Hypoxia Dyspnea Hemoptysis-resolved; no further episodes noted; no blood in airway during bronchoscopy P:  -Full vent support, settings reviewed  -Maintain SPO2 greater than 88% - chest x-ray in the morning -Continue with steroids and antibiotics for now. Will hold off on taper due to worsening CXR  -Could be a hypersensitivity type reaction -VAP prevention -Bal cultures, thus far showing Candida, await further results. Will consider empiric fungal treatment if not improving.   A: CARDIOVASCULAR Elevated troponin Hypertension P:  Continued monitoring Continue with home meds MAP>65  A: RENAL -ICU electrolyte replacement protocol. -Renal function is improved.  A: GASTROINTESTINAL A: Melanotic Stools Hemoccult positive Emesis x 1 P: Proton pump inhibitor for stress ulcer prophylaxis Melanotic stools, firm abdomen-CT abdomen and pelvis Hold TFs x 12hrs STAT KUB reviewed; Constipation; lactulose ordered  A: HEMATOLOGIC Monitor CBC  INFECTIOUS Possible multifocal/bilateral pneumonia Continue current antibiotics   A: ENDOCRINE ICU hypo/hyperglycemia protocol  A: NEUROLOGIC Accidental fall Confusion -  improving P:  RASS goal: 0 CT head negative for acute hemorrhage Monitor mental status    Family update:Sister at bedside; updated on patient's status Critical care time spent examining patient, establishing treatment plan, managing vent, reviewing history and labs, CXR, and ABG interpretation is 42 minutes  Magdalene S. Tukov ANP-BC Pulmonary and Critical Care Medicine [facility] Pager [phone]  Patient seen and examined along with the nurse practitioner, I agree with and participated in formulation of the assessment and plan. Today, the patient's FiO2 has been decreased to 80%, PEEP continues at 14. We'll continue to wean down FiO2 as tolerated, continue steroids. The patient is  more awake today, despite propofol and fentanyl infusions, he follows commands. We'll place bilateral wrist restraints to reduce the risk of self extubation.  -Wells Guiles, M.D.   Critical Care Attestation.  I have personally obtained a history, examined the patient, evaluated laboratory and imaging results, formulated the assessment and plan and placed orders. The Patient requires high complexity decision making for assessment and support, frequent evaluation and titration of therapies, application of advanced monitoring technologies and extensive interpretation of multiple databases. The patient has critical illness that could lead imminently to failure of 1 or more organ systems and requires the highest level of physician preparedness to intervene.  Critical Care Time devoted to patient care services described in this note is 42 minutes and is exclusive of time spent in procedures.

## 2016-01-05 NOTE — Progress Notes (Signed)
eLink Physician-Brief Progress Note Patient Name: Larry Pacheco DOB: 09/15/1946 MRN: [id number]   Date of Service  01/05/2016  HPI/Events of Note  Hypokalemia and hypophosphatemia  eICU Interventions  Potassium and Phos replaced     Intervention Category Intermediate Interventions: Electrolyte abnormality - evaluation and management  DETERDING,ELIZABETH 01/05/2016, 5:15 AM

## 2016-01-05 NOTE — Progress Notes (Signed)
eLink Physician-Brief Progress Note Patient Name: Larry Pacheco DOB: 29-Oct-1946 MRN: [id number]   Date of Service  01/05/2016  HPI/Events of Note  Coffee ground material coming out of NGT tube. Last Hgb = 9.7. Hemodynamics stable. Patient is currently on Heparin [place] and SCD's for DVT prophylaxis.  eICU Interventions  Will D/C Heparin [place] and follow clinically.      Intervention Category Intermediate Interventions: Other:  Lenell Antu 01/05/2016, 7:05 PM

## 2016-01-05 NOTE — Consult Note (Signed)
GI Inpatient Follow-up Note  Patient Identification: Salam Micucci is a 70 y.o. male  Subjective: Remains intubated. Alert. No further bleeding. Had greenish stool last night. Hgb relatively stable.   Scheduled Inpatient Medications:  . amLODipine  5 mg Oral Daily  . antiseptic oral rinse  7 mL Mouth Rinse QID  . azithromycin  500 mg Intravenous Q24H  . budesonide (PULMICORT) nebulizer solution  0.25 mg Nebulization BID  . chlorhexidine gluconate  15 mL Mouth Rinse BID  . feeding supplement (PRO-STAT SUGAR FREE 64)  30 mL Per Tube BID  . free water  200 mL Per Tube 3 times per day  . haloperidol lactate  2 mg Intravenous Once  . heparin subcutaneous  5,000 Units Subcutaneous 3 times per day  . insulin aspart  0-15 Units Subcutaneous 6 times per day  . methylPREDNISolone (SOLU-MEDROL) injection  40 mg Intravenous Q12H  . metoprolol tartrate  25 mg Oral BID  . pantoprazole (PROTONIX) IV  40 mg Intravenous Q24H  . piperacillin-tazobactam  3.375 g Intravenous 3 times per day  . polyethylene glycol  17 g Per NG tube Daily  . potassium chloride  10 mEq Intravenous Q1 Hr x 6  . potassium phosphate IVPB (mmol)  30 mmol Intravenous Once    Continuous Inpatient Infusions:   . sodium chloride 75 mL/hr at 01/05/16 0937  . dexmedetomidine Stopped (01/04/16 1400)  . feeding supplement (VITAL AF 1.2 CAL) 1,000 mL (01/05/16 0700)  . fentaNYL infusion INTRAVENOUS 300 mcg/hr (01/05/16 0826)  . norepinephrine (LEVOPHED) Adult infusion Stopped (01/04/16 2117)  . propofol (DIPRIVAN) infusion 19.9 mcg/kg/min (01/05/16 0700)    PRN Inpatient Medications:  acetaminophen **OR** acetaminophen, bisacodyl, chlorpheniramine-HYDROcodone, chlorproMAZINE, docusate, fentaNYL, levalbuterol, midazolam, midazolam, ondansetron **OR** ondansetron (ZOFRAN) IV, sodium chloride  Review of Systems: Constitutional: Weight is stable.  Eyes: No changes in vision. ENT: No oral lesions, sore throat.  GI: see HPI.   Heme/Lymph: No easy bruising.  CV: No chest pain.  GU: No hematuria.  Integumentary: No rashes.  Neuro: No headaches.  Psych: No depression/anxiety.  Endocrine: No heat/cold intolerance.  Allergic/Immunologic: No urticaria.  Resp: No cough, SOB.  Musculoskeletal: No joint swelling.    Physical Examination: BP 162/96 mmHg  Pulse 115  Temp(Src) 97.7 F (36.5 C) (Oral)  Resp 16  Ht  (1.676 m)  Wt 59 kg (130 lb 1.1 oz)  BMI 21.00 kg/m2  SpO2 92% Gen: NAD, alert and oriented x 4 HEENT: PEERLA, EOMI, Neck: supple, no JVD or thyromegaly Chest: CTA bilaterally, no wheezes, crackles, or other adventitious sounds CV: RRR, no m/g/c/r Abd: soft, NT, ND, +BS in all four quadrants; no HSM, guarding, ridigity, or rebound tenderness Ext: no edema, well perfused with 2+ pulses, Skin: no rash or lesions noted Lymph: no LAD  Data: Lab Results  Component Value Date   WBC 17.1* 01/05/2016   HGB 9.7* 01/05/2016   HCT 28.2* 01/05/2016   MCV 89.0 01/05/2016   PLT 414 01/05/2016    Recent Labs Lab 01/03/16 0608 01/04/16 0905 01/05/16 0426  HGB 10.3* 8.3* 9.7*   Lab Results  Component Value Date   NA 137 01/05/2016   K 2.9* 01/05/2016   CL 100* 01/05/2016   CO2 30 01/05/2016   BUN 34* 01/05/2016   CREATININE 1.07 01/05/2016   Lab Results  Component Value Date   ALT 101* 01/03/2016   AST 122* 01/03/2016   ALKPHOS 47 01/03/2016   BILITOT 1.2 01/03/2016    Recent Labs Lab  19-Jan-2016 1501  INR 1.01   Assessment/Plan: Mr. Dimarco is a 70 y.o. male with episode of melena few days ago. Likely from swallowed blood from hemoptysis and not internal GI bleeding.  Recommendations: Continue PPI daily to prevent stress ulcer. Otherwise, no need for EGD. Will sign off. Thanks.  Please call with questions or concerns.  Melayah Skorupski, Ezzard Standing, MD

## 2016-01-05 NOTE — Progress Notes (Signed)
eLink Physician-Brief Progress Note Patient Name: Larry Pacheco DOB: 05-01-1946 MRN: [id number]   Date of Service  01/05/2016  HPI/Events of Note  Nurse confused about the D5 0.45 NaCl order. Last blood glucose = 249.   eICU Interventions  Will order: 1. D/C D5 0.45 NaCl bolus order. 2. 0.9 NaCl to run IV at 75 mL/hour.      Intervention Category Intermediate Interventions: Electrolyte abnormality - evaluation and management  Sommer,Steven Eugene 01/05/2016, 5:02 PM

## 2016-01-05 NOTE — Progress Notes (Signed)
Coffee ground gastric output noted from OG to LIS canister. Misty RN with E-Link notified who was going to notify Dr. Dellie Catholic. Alyaan Budzynski E 6:46 PM 01/05/2016

## 2016-01-06 ENCOUNTER — Inpatient Hospital Stay: Payer: Medicare Other

## 2016-01-06 DIAGNOSIS — J80 Acute respiratory distress syndrome: Secondary | ICD-10-CM

## 2016-01-06 DIAGNOSIS — R000 Tachycardia, unspecified: Secondary | ICD-10-CM

## 2016-01-06 DIAGNOSIS — Z9911 Dependence on respirator [ventilator] status: Secondary | ICD-10-CM

## 2016-01-06 LAB — BASIC METABOLIC PANEL
Anion gap: 4 — ABNORMAL LOW (ref 5–15)
BUN: 26 mg/dL — ABNORMAL HIGH (ref 6–20)
CHLORIDE: 104 mmol/L (ref 101–111)
CO2: 32 mmol/L (ref 22–32)
CREATININE: 0.87 mg/dL (ref 0.61–1.24)
Calcium: 7 mg/dL — ABNORMAL LOW (ref 8.9–10.3)
GFR calc non Af Amer: >60 mL/min (ref >60)
Glucose, Bld: 141 mg/dL — ABNORMAL HIGH (ref 65–99)
POTASSIUM: 4 mmol/L (ref 3.5–5.1)
SODIUM: 140 mmol/L (ref 135–145)

## 2016-01-06 LAB — BLOOD GAS, ARTERIAL
ALLENS TEST (PASS/FAIL): POSITIVE — AB
Acid-Base Excess: 8.5 mmol/L — ABNORMAL HIGH (ref 0.0–3.0)
Bicarbonate: 32.8 mEq/L — ABNORMAL HIGH (ref 21.0–28.0)
FIO2: 0.8
MECHANICAL RATE: 20
O2 SAT: 91.2 %
PEEP: 14 cmH2O
Patient temperature: 37
RATE: 20 resp/min
VT: 390 mL
pCO2 arterial: 43 mmHg (ref 32.0–48.0)
pH, Arterial: 7.49 — ABNORMAL HIGH (ref 7.350–7.450)
pO2, Arterial: 56 mmHg — ABNORMAL LOW (ref 83.0–108.0)

## 2016-01-06 LAB — CBC
HCT: 27.4 % — ABNORMAL LOW (ref 40.0–52.0)
Hemoglobin: 9.2 g/dL — ABNORMAL LOW (ref 13.0–18.0)
MCH: 29.9 pg (ref 26.0–34.0)
MCHC: 33.6 g/dL (ref 32.0–36.0)
MCV: 89.1 fL (ref 80.0–100.0)
PLATELETS: 517 10*3/uL — AB (ref 150–440)
RBC: 3.07 MIL/uL — ABNORMAL LOW (ref 4.40–5.90)
RDW: 13.6 % (ref 11.5–14.5)
WBC: 22.4 10*3/uL — ABNORMAL HIGH (ref 3.8–10.6)

## 2016-01-06 LAB — RAPID HIV SCREEN (HIV 1/2 AB+AG)
HIV 1/2 Antibodies: NONREACTIVE
HIV-1 P24 Antigen - HIV24: NONREACTIVE

## 2016-01-06 LAB — GLUCOSE, CAPILLARY
GLUCOSE-CAPILLARY: 109 mg/dL — AB (ref 65–99)
GLUCOSE-CAPILLARY: 122 mg/dL — AB (ref 65–99)
GLUCOSE-CAPILLARY: 177 mg/dL — AB (ref 65–99)
Glucose-Capillary: 130 mg/dL — ABNORMAL HIGH (ref 65–99)
Glucose-Capillary: 138 mg/dL — ABNORMAL HIGH (ref 65–99)
Glucose-Capillary: 158 mg/dL — ABNORMAL HIGH (ref 65–99)

## 2016-01-06 LAB — CULTURE, BAL-QUANTITATIVE W GRAM STAIN

## 2016-01-06 LAB — CULTURE, BAL-QUANTITATIVE: SPECIAL REQUESTS: NORMAL

## 2016-01-06 LAB — PHOSPHORUS: Phosphorus: 1.9 mg/dL — ABNORMAL LOW (ref 2.5–4.6)

## 2016-01-06 LAB — MISC LABCORP TEST (SEND OUT): Labcorp test code: 9985

## 2016-01-06 LAB — MAGNESIUM: MAGNESIUM: 2.1 mg/dL (ref 1.7–2.4)

## 2016-01-06 MED ORDER — POTASSIUM & SODIUM PHOSPHATES 280-160-250 MG PO PACK: 1.0000 | PACK | Freq: Three times a day (TID) | ORAL | Status: DC

## 2016-01-06 MED ORDER — FAMOTIDINE 40 MG/5ML PO SUSR
20.0000 mg | Freq: Two times a day (BID) | ORAL | Status: DC
  Administered 2016-01-06 – 2016-01-13 (×16): 20 mg
  Filled 2016-01-06 (×20): qty 2.5

## 2016-01-06 MED ORDER — POTASSIUM & SODIUM PHOSPHATES 280-160-250 MG PO PACK
2.0000 | PACK | Freq: Three times a day (TID) | ORAL | Status: AC
  Administered 2016-01-06 (×3): 2
  Filled 2016-01-06 (×3): qty 2

## 2016-01-06 MED ORDER — AMLODIPINE BESYLATE 5 MG PO TABS
5.0000 mg | ORAL_TABLET | Freq: Every day | ORAL | Status: DC
  Filled 2016-01-06: qty 1

## 2016-01-06 MED ORDER — POLYETHYLENE GLYCOL 3350 17 G PO PACK
17.0000 g | PACK | Freq: Every day | ORAL | Status: DC | PRN
  Administered 2016-01-08: 17 g via NASOGASTRIC
  Filled 2016-01-06: qty 1

## 2016-01-06 MED ORDER — POTASSIUM CHLORIDE 20 MEQ/15ML (10%) PO SOLN
40.0000 meq | ORAL | Status: DC
  Administered 2016-01-06: 40 meq
  Filled 2016-01-06: qty 30

## 2016-01-06 MED ORDER — LEVALBUTEROL HCL 1.25 MG/0.5ML IN NEBU: 0.6300 mg | INHALATION_SOLUTION | Freq: Four times a day (QID) | RESPIRATORY_TRACT | Status: DC | PRN

## 2016-01-06 MED ORDER — METOPROLOL TARTRATE 50 MG PO TABS
50.0000 mg | ORAL_TABLET | Freq: Two times a day (BID) | ORAL | Status: DC
  Administered 2016-01-06 – 2016-01-08 (×4): 50 mg
  Filled 2016-01-06 (×4): qty 1

## 2016-01-06 MED ORDER — VITAL AF 1.2 CAL PO LIQD
1000.0000 mL | ORAL | Status: DC
  Administered 2016-01-06: 1000 mL

## 2016-01-06 MED ORDER — INSULIN GLARGINE 100 UNIT/ML ~~LOC~~ SOLN
10.0000 [IU] | Freq: Every day | SUBCUTANEOUS | Status: DC
  Administered 2016-01-06 – 2016-01-14 (×9): 10 [IU] via SUBCUTANEOUS
  Filled 2016-01-06 (×12): qty 0.1

## 2016-01-06 NOTE — Progress Notes (Signed)
PARENTERAL NUTRITION CONSULT NOTE - INITIAL  Pharmacy Consult for Electrolyte Monitoring and Replacement Indication: Hypokalemia  No Known Allergies  Patient Measurements: Height:  (167.6 cm) Weight: 132 lb 4.4 oz (60 kg) IBW/kg (Calculated) : 63.8  Vital Signs: Temp: 99.4 F (37.4 C) (02/20 0400) Temp Source: Axillary (02/20 0400) BP: 168/80 mmHg (02/20 0600) Pulse Rate: 127 (02/20 0600) Intake/Output from previous day: 02/19 0701 - 02/20 0700 In: 2813.9 [I.V.:1963.9; NG/GT:600; IV Piggyback:250] Out: 2800 [Urine:2100; Emesis/NG output:700] Intake/Output from this shift: Total I/O In: 1379.4 [I.V.:1279.4; IV Piggyback:100] Out: 1250 [Urine:1050; Emesis/NG output:200]  Labs:  Recent Labs  01/04/16 0905 01/05/16 0426 01/06/16 0457  WBC 10.6 17.1* 22.4*  HGB 8.3* 9.7* 9.2*  HCT 24.3* 28.2* 27.4*  PLT 282 414 517*     Recent Labs  01/04/16 0540 01/04/16 0905 01/05/16 0426 01/06/16 0457  NA 138  --  137 140  K 3.8  --  2.9* 4.0  CL 102  --  100* 104  CO2 35*  --  30 32  GLUCOSE 190*  --  249* 141*  BUN 32*  --  34* 26*  CREATININE 1.08  --  1.07 0.87  CALCIUM 6.7*  --  7.0* 7.0*  MG 2.4  --  2.2 2.1  PHOS 3.1  --  2.1* 1.9*  TRIG  --  95  --   --    Estimated Creatinine Clearance: 66.1 mL/min (by C-G formula based on Cr of 0.87).    Recent Labs  01/05/16 1958 01/05/16 2358 01/06/16 0346  GLUCAP 108* 102* 130*    Medical History: History reviewed. No pertinent past medical history.  Medications:  Scheduled:  . amLODipine  5 mg Oral Daily  . antiseptic oral rinse  7 mL Mouth Rinse QID  . budesonide (PULMICORT) nebulizer solution  0.25 mg Nebulization BID  . chlorhexidine gluconate  15 mL Mouth Rinse BID  . feeding supplement (PRO-STAT SUGAR FREE 64)  30 mL Per Tube BID  . free water  200 mL Per Tube 3 times per day  . haloperidol lactate  2 mg Intravenous Once  . insulin aspart  0-15 Units Subcutaneous 6 times per day  .  methylPREDNISolone (SOLU-MEDROL) injection  40 mg Intravenous Q12H  . metoprolol tartrate  25 mg Oral BID  . pantoprazole (PROTONIX) IV  40 mg Intravenous Q24H  . piperacillin-tazobactam  3.375 g Intravenous 3 times per day  . polyethylene glycol  17 g Per NG tube Daily  . potassium & sodium phosphates  2 packet Per Tube TID WC   Infusions:  . sodium chloride 75 mL/hr at 01/05/16 1713  . dexmedetomidine Stopped (01/04/16 1400)  . feeding supplement (VITAL AF 1.2 CAL) Stopped (01/05/16 1707)  . fentaNYL infusion INTRAVENOUS 350 mcg/hr (01/06/16 0235)  . norepinephrine (LEVOPHED) Adult infusion Stopped (01/04/16 2117)  . propofol (DIPRIVAN) infusion 30 mcg/kg/min (01/06/16 0400)     Assessment: Pharmacy consulted to assist in replacing electrolytes in this 70 y/o M with respiratory failure and PNA. Patient is currently ventilated and sedated.   Plan:  K 4, Phos 2.1, Mg 1.9. Patient received 30 mmol Kphos yesterday. Will order 2 pack PhosNak Per Tube TID with meals x 3 doses today and recheck with AM labs.   Carola Frost, Pharm.D., BCPS Clinical Pharmacist 01/06/2016,6:14 AM

## 2016-01-06 NOTE — Progress Notes (Signed)
Nutrition Follow-up    INTERVENTION:   EN: agree with restarting TF at rate of 20 ml/hr; assess for tolerance. Goal rate with current diprivan is 40 ml/hr with Prostat daily; continue to assess   NUTRITION DIAGNOSIS:   Inadequate oral intake related to acute illness as evidenced by meal completion < 50%. Being addressed via TF while on vent  GOAL:   Patient will meet greater than or equal to 90% of their needs  MONITOR:    (Energy Intake, Anthropometrics, Digestive System)  REASON FOR ASSESSMENT:   Consult Enteral/tube feeding initiation and management  ASSESSMENT:    Pt remains on vent, pneumonitis or ARDS; +emesis over the weekend, TF held, coffee ground output via tube, phosphorus being replaced  Diet Order:  Diet NPO time specified   EN: MD reorderd Vital AF 1.2 at rate of 20 ml/hr this AM  Digestive System: abdominal xray yesterday with no evidenced of bowel obstruction, normal bowel gas pattern, +stool 2/18  Skin:  Reviewed, no issues    Recent Labs Lab 01/04/16 0540 01/05/16 0426 01/06/16 0457  NA 138 137 140  K 3.8 2.9* 4.0  CL 102 100* 104  CO2 35* 30 32  BUN 32* 34* 26*  CREATININE 1.08 1.07 0.87  CALCIUM 6.7* 7.0* 7.0*  MG 2.4 2.2 2.1  PHOS 3.1 2.1* 1.9*  GLUCOSE 190* 249* 141*    Glucose Profile:  Recent Labs  01/06/16 0346 01/06/16 0721 01/06/16 1157  GLUCAP 130* 122* 138*   Meds: ss novolog, lantus, solumedrol, diprivan 257 kcals   Height:   Ht Readings from Last 1 Encounters:  23-Jan-2016  (1.676 m)    Weight:   Wt Readings from Last 1 Encounters:  01/06/16 132 lb 4.4 oz (60 kg)    Filed Weights   01/04/16 0423 01/05/16 0500 01/06/16 0531  Weight: 132 lb 15 oz (60.3 kg) 130 lb 1.1 oz (59 kg) 132 lb 4.4 oz (60 kg)    BMI:  Body mass index is 21.36 kg/(m^2).  Estimated Nutritional Needs:   Kcal:  1468 kcals (BEE 1243, Ve: 5.1, Tmax: 37.9) using wt of 55 kg  Protein:  83-110 g (1.5-2.0 g/kg)   Fluid:  1375-1650  mL (25-30 ml/kg)   HIGH Care Level  Romelle Starcher MS, RD, LDN [phone] Pager  [phone] Weekend/On-Call Pager

## 2016-01-06 NOTE — Care Management (Signed)
Patient does not meet criteria for vent weaning at present

## 2016-01-06 NOTE — Progress Notes (Signed)
Patient ID: Larry Pacheco, male   DOB: 06-28-1946, 70 y.o.   MRN: [id number] [name]:[id number] DOB: Jun 06, 1946 DOA: January 18, 2016 PCP: No primary care provider on file.  HPI/Subjective: Patient intubated and sedated. Patient open eyes to my voice. Not able to follow commands today secondary to sedation  Objective: Filed Vitals:   01/06/16 1200 01/06/16 1215  BP: 160/88   Pulse: 113 127  Temp: 98 F (36.7 C)   Resp: 24 22    Filed Weights   01/04/16 0423 01/05/16 0500 01/06/16 0531  Weight: 60.3 kg (132 lb 15 oz) 59 kg (130 lb 1.1 oz) 60 kg (132 lb 4.4 oz)    ROS: ROS Limited secondary to being on the ventilator Exam: Physical Exam  Constitutional: He is intubated.  HENT:  Nose: No mucosal edema.  Unable to look and mouth  Eyes:  Pupils pinpoint  Neck: Normal carotid pulses present. Carotid bruit is not present.  Cardiovascular:  Pulses:      Dorsalis pedis pulses are 1+ on the right side, and 1+ on the left side.  Respiratory: He is intubated. He has decreased breath sounds in the right lower field. He has no wheezes. He has rhonchi in the right lower field and the left lower field.  GI: Soft. Bowel sounds are normal. There is no tenderness.  Musculoskeletal:       Right ankle: He exhibits no swelling.       Left ankle: He exhibits no swelling.  Neurological:  Patient intubated and sedated  Skin: Skin is warm. No rash noted. Nails show no clubbing.  Psychiatric:  Patient intubated      Data Reviewed: Basic Metabolic Panel:  Recent Labs Lab 01/01/16 0207 01/03/16 3664 01/03/16 2303 01/03/16 2308 01/04/16 0540 01/05/16 0426 01/06/16 0457  NA 134* 139  --   --  138 137 140  K 3.8 3.0* 3.3*  --  3.8 2.9* 4.0  CL 103 101  --   --  102 100* 104  CO2 21* 30  --   --  35* 30 32  GLUCOSE 157* 190*  --   --  190* 249* 141*  BUN 28* 29*  --   --  32* 34* 26*  CREATININE 1.14 1.24  --   --  1.08 1.07 0.87  CALCIUM  7.1* 7.1*  --   --  6.7* 7.0* 7.0*  MG  --  2.5*  --   --  2.4 2.2 2.1  PHOS  --   --   --  1.8* 3.1 2.1* 1.9*   Liver Function Tests:  Recent Labs Lab 18-Jan-2016 1501 01/03/16 0608  AST 85* 122*  ALT 41 101*  ALKPHOS 58 47  BILITOT 0.6 1.2  PROT 7.2 5.4*  ALBUMIN 3.4* 2.1*   CBC:  Recent Labs Lab January 18, 2016 1501  01/01/16 0207 01/03/16 0608 01/04/16 0905 01/05/16 0426 01/06/16 0457  WBC 7.3  < > 12.3* 15.2* 10.6 17.1* 22.4*  NEUTROABS 5.9  --   --  14.3*  --   --   --   HGB 18.0  < > 13.9 10.3* 8.3* 9.7* 9.2*  HCT 53.0*  < > 40.5 30.8* 24.3* 28.2* 27.4*  MCV 87.8  < > 87.7 89.6 88.6 89.0 89.1  PLT 196  < > 203 288 282 414 517*  < > = values in this interval not displayed. Cardiac Enzymes:  Recent Labs Lab 01-18-2016 1501 18-Jan-2016 1836 01/18/16 1958 12/31/15 0522  TROPONINI 0.05* 0.06* 0.05* 0.09*   CBG:  Recent Labs Lab 01/05/16 1958 01/05/16 2358 01/06/16 0346 01/06/16 0721 01/06/16 1157  GLUCAP 108* 102* 130* 122* 138*    Recent Results (from the past 240 hour(s))  Rapid Influenza A&B Antigens ([facility] only)     Status: None   Collection Time: 12/31/2015  3:01 PM  Result Value Ref Range Status   Influenza A ([facility]) NEGATIVE  Final   Influenza B ([facility]) NEGATIVE  Final  Blood Culture (routine x 2)     Status: None   Collection Time: 12/25/2015  3:55 PM  Result Value Ref Range Status   Specimen Description BLOOD RIGHT ASSIST CONTROL  Final   Special Requests   Final    BOTTLES DRAWN AEROBIC AND ANAEROBIC  6CC AERO, 5CC ANA   Culture  Setup Time   Final    GRAM POSITIVE COCCI ANAEROBIC BOTTLE ONLY CRITICAL RESULT CALLED TO, READ BACK BY AND VERIFIED WITH: Ihor Austin 01/02/16 1250PM MLM Organism ID to follow    Culture   Final    STAPHYLOCOCCUS SACCHAROLYTICUS ANAEROBIC BOTTLE ONLY Results consistent with contamination.    Report Status 01/05/2016 FINAL  Final  Blood Culture ID Panel (Reflexed)     Status: None   Collection Time: 01/03/2016  3:55 PM   Result Value Ref Range Status   Enterococcus species NOT DETECTED NOT DETECTED Final   Listeria monocytogenes NOT DETECTED NOT DETECTED Final   Staphylococcus species NOT DETECTED NOT DETECTED Final   Staphylococcus aureus NOT DETECTED NOT DETECTED Final   Streptococcus species NOT DETECTED NOT DETECTED Final   Streptococcus agalactiae NOT DETECTED NOT DETECTED Final   Streptococcus pneumoniae NOT DETECTED NOT DETECTED Final   Streptococcus pyogenes NOT DETECTED NOT DETECTED Final   Acinetobacter baumannii NOT DETECTED NOT DETECTED Final   Enterobacteriaceae species NOT DETECTED NOT DETECTED Final   Enterobacter cloacae complex NOT DETECTED NOT DETECTED Final   Escherichia coli NOT DETECTED NOT DETECTED Final   Klebsiella oxytoca NOT DETECTED NOT DETECTED Final   Klebsiella pneumoniae NOT DETECTED NOT DETECTED Final   Proteus species NOT DETECTED NOT DETECTED Final   Serratia marcescens NOT DETECTED NOT DETECTED Final   Haemophilus influenzae NOT DETECTED NOT DETECTED Final   Neisseria meningitidis NOT DETECTED NOT DETECTED Final   Pseudomonas aeruginosa NOT DETECTED NOT DETECTED Final   Candida albicans NOT DETECTED NOT DETECTED Final   Candida glabrata NOT DETECTED NOT DETECTED Final   Candida krusei NOT DETECTED NOT DETECTED Final   Candida parapsilosis NOT DETECTED NOT DETECTED Final   Candida tropicalis NOT DETECTED NOT DETECTED Final   Carbapenem resistance NOT DETECTED NOT DETECTED Final   Methicillin resistance NOT DETECTED NOT DETECTED Final   Vancomycin resistance NOT DETECTED NOT DETECTED Final  Blood Culture (routine x 2)     Status: None   Collection Time: 01/05/2016  4:01 PM  Result Value Ref Range Status   Specimen Description BLOOD LEFT ASSIST CONTROL  Final   Special Requests   Final    BOTTLES DRAWN AEROBIC AND ANAEROBIC  3CC AERO, 2CC ANA   Culture NO GROWTH 5 DAYS  Final   Report Status 01/04/2016 FINAL  Final  Urine culture     Status: None   Collection Time:  12/21/2015  6:36 PM  Result Value Ref Range Status   Specimen Description URINE, RANDOM  Final   Special Requests NONE  Final   Culture NO GROWTH 1 DAY  Final   Report Status 01/01/2016  FINAL  Final  MRSA PCR Screening     Status: None   Collection Time: 01/01/16  2:08 AM  Result Value Ref Range Status   MRSA by PCR NEGATIVE NEGATIVE Final    Comment:        The GeneXpert MRSA Assay (FDA approved for NASAL specimens only), is one component of a comprehensive MRSA colonization surveillance program. It is not intended to diagnose MRSA infection nor to guide or monitor treatment for MRSA infections.   Gastrointestinal Panel by PCR , Stool     Status: None   Collection Time: 01/01/16  6:59 AM  Result Value Ref Range Status   Campylobacter species NOT DETECTED NOT DETECTED Final   Plesimonas shigelloides NOT DETECTED NOT DETECTED Final   Salmonella species NOT DETECTED NOT DETECTED Final   Yersinia enterocolitica NOT DETECTED NOT DETECTED Final   Vibrio species NOT DETECTED NOT DETECTED Final   Vibrio cholerae NOT DETECTED NOT DETECTED Final   Enteroaggregative E coli (EAEC) NOT DETECTED NOT DETECTED Final   Enteropathogenic E coli (EPEC) NOT DETECTED NOT DETECTED Final   Enterotoxigenic E coli (ETEC) NOT DETECTED NOT DETECTED Final   Shiga like toxin producing E coli (STEC) NOT DETECTED NOT DETECTED Final   E. coli O157 NOT DETECTED NOT DETECTED Final   Shigella/Enteroinvasive E coli (EIEC) NOT DETECTED NOT DETECTED Final   Cryptosporidium NOT DETECTED NOT DETECTED Final   Cyclospora cayetanensis NOT DETECTED NOT DETECTED Final   Entamoeba histolytica NOT DETECTED NOT DETECTED Final   Giardia lamblia NOT DETECTED NOT DETECTED Final   Adenovirus F40/41 NOT DETECTED NOT DETECTED Final   Astrovirus NOT DETECTED NOT DETECTED Final   Norovirus GI/GII NOT DETECTED NOT DETECTED Final   Rotavirus A NOT DETECTED NOT DETECTED Final   Sapovirus (I, II, IV, and V) NOT DETECTED NOT DETECTED  Final  C difficile quick scan w PCR reflex     Status: None   Collection Time: 01/01/16  6:59 AM  Result Value Ref Range Status   C Diff antigen NEGATIVE NEGATIVE Final   C Diff toxin NEGATIVE NEGATIVE Final   C Diff interpretation Negative for C. difficile  Final  Culture, expectorated sputum-assessment     Status: None   Collection Time: 01/01/16 10:46 AM  Result Value Ref Range Status   Specimen Description SPUTUM  Final   Special Requests Normal  Final   Sputum evaluation THIS SPECIMEN IS ACCEPTABLE FOR SPUTUM CULTURE  Final   Report Status 01/01/2016 FINAL  Final  Culture, respiratory (NON-Expectorated)     Status: None   Collection Time: 01/01/16 10:46 AM  Result Value Ref Range Status   Specimen Description SPUTUM  Final   Special Requests Normal Reflexed from [id number]  Final   Gram Stain   Final    FAIR SPECIMEN - 70-80% WBCS FEW SQUAMOUS EPITHELIAL CELLS PRESENT MODERATE WBC SEEN MODERATE YEAST FEW GRAM NEGATIVE RODS RARE GRAM POSITIVE COCCI IN PAIRS    Culture LIGHT GROWTH CANDIDA ALBICANS  Final   Report Status 01/04/2016 FINAL  Final  Fungus Culture with Smear     Status: None (Preliminary result)   Collection Time: 01/02/16  2:52 PM  Result Value Ref Range Status   Specimen Description BRONCHIAL WASHINGS  Final   Special Requests Normal  Final   Culture CANDIDA ALBICANS  Final   Report Status PENDING  Incomplete  Culture, bal-quantitative     Status: None   Collection Time: 01/02/16  2:52 PM  Result Value Ref  Range Status   Specimen Description BRONCHIAL WASHINGS  Final   Special Requests Normal  Final   Gram Stain   Final    FAIR SPECIMEN - 70-80% WBCS FEW SQUAMOUS EPITHELIAL CELLS PRESENT MODERATE WBC SEEN MANY YEAST    Culture HEAVY GROWTH CANDIDA ALBICANS  Final   Report Status 01/06/2016 FINAL  Final     Studies: Dg Chest 1 View  01/06/2016  CLINICAL DATA:  Dyspnea, community-acquired pneumonia, sepsis, hemoptysis. EXAM: CHEST 1 VIEW COMPARISON:   Portable chest x-ray of January 05, 2016 FINDINGS: The lungs are well-expanded. There remain confluent increased interstitial densities bilaterally. There is no pneumothorax or pleural effusion. The heart is normal in size. The pulmonary vascularity is indistinct but no more than mildly engorged. The mediastinum is normal in width. The endotracheal tube tip lies 4.9 cm above the carina. The esophagogastric tube tip projects below the inferior margin of the image. The right internal jugular venous catheter tip projects over the midportion of the SVC. IMPRESSION: Stable bilateral interstitial densities most compatible with edema or pneumonia. There is no pneumothorax or pleural effusion. The support tubes are in reasonable position. Electronically Signed   By: David  [place] M.D.   On: 01/06/2016 07:15   Dg Abd 1 View  01/05/2016  CLINICAL DATA:  Sudden onset of vomiting today.  ICU patient. EXAM: ABDOMEN - 1 VIEW COMPARISON:  CT, 01/03/2016 FINDINGS: Normal bowel gas pattern. There is some residual contrast in the colon. Nasal/ orogastric tube projects in the mid stomach, well positioned. No evidence of renal or ureteral stones. Soft tissues are unremarkable. IMPRESSION: 1. No acute findings. No evidence of bowel obstruction. Well-positioned nasal/orogastric tube. Electronically Signed   By: Amie Portland M.D.   On: 01/05/2016 12:15   Dg Chest Port 1 View  01/05/2016  CLINICAL DATA:  Respiratory failure EXAM: PORTABLE CHEST 1 VIEW COMPARISON:  None. FINDINGS: Endotracheal tube, NG tube, right jugular venous catheter are stable. Diffuse airspace disease is stable. No pneumothorax. No pleural effusion. IMPRESSION: Stable diffuse airspace disease. Electronically Signed   By: Jolaine Click M.D.   On: 01/05/2016 08:57    Scheduled Meds: . [START ON 01/07/2016] amLODipine  5 mg Per Tube Daily  . antiseptic oral rinse  7 mL Mouth Rinse QID  . chlorhexidine gluconate  15 mL Mouth Rinse BID  . famotidine  20 mg Per  Tube BID  . feeding supplement (PRO-STAT SUGAR FREE 64)  30 mL Per Tube BID  . free water  200 mL Per Tube 3 times per day  . insulin aspart  0-15 Units Subcutaneous 6 times per day  . insulin glargine  10 Units Subcutaneous Daily  . methylPREDNISolone (SOLU-MEDROL) injection  40 mg Intravenous Q12H  . metoprolol tartrate  50 mg Per Tube BID  . piperacillin-tazobactam  3.375 g Intravenous 3 times per day  . potassium & sodium phosphates  2 packet Per Tube TID WC   Continuous Infusions: . sodium chloride 10 mL/hr at 01/06/16 0900  . feeding supplement (VITAL AF 1.2 CAL) 1,000 mL (01/06/16 1114)  . fentaNYL infusion INTRAVENOUS 350 mcg/hr (01/06/16 0719)  . propofol (DIPRIVAN) infusion 35 mcg/kg/min (01/06/16 1154)    Assessment/Plan:  1. Clinical sepsis with pneumonia bilaterally. Patient is on Zosyn and Zithromax course was completed. Blood culture drawn on admission is a Skin contamination. 2. Septic shock- levophed has been stopped. 3. Acute respiratory failure with hypoxia. Secondary to rapidly progressing pneumonia. Bronchoscopy showing yeast. Patient on 70% FiO2. anca  negative 4. GI bleed with drop in hemoglobin. Today's hemoglobin is higher at 9.2. Patient is on Protonix. 5. Nutrition continue tube feeds at low rate, yesterday had some emasis 6. Essential hypertension, tachycardia- patient started on metoprolol 7. Hypokalemia, hypophosphatemia- kphos ordered  Code Status:     Code Status Orders        Start     Ordered   01/11/2016 1823  Full code   Continuous     01/10/2016 1823    Code Status History    Date Active Date Inactive Code Status Order ID Comments User Context   This patient has a current code status but no historical code status.    Advance Directive Documentation        Most Recent Value   Type of Advance Directive  Living will   Pre-existing out of facility DNR order (yellow form or pink MOST form)     "MOST" Form in Place?       Family  Communication: Spoke with sister at the bedside yesterday. Disposition Plan: To be determined  Consultants:  Critical care specialist  gastroenterologist  Procedures:  Intubation  bronchoscopy  Antibiotics:  Zosyn   Zithromax (finished)  Time spent: 31 minutes critical care time  Alford Highland  [facility]

## 2016-01-06 NOTE — Progress Notes (Signed)
PULMONARY / CRITICAL CARE MEDICINE   Name: Larry Pacheco MRN: [id number] DOB: 24-Jan-1946    ADMISSION DATE:  28-Jan-2016  BRIEF HISTORY: 70 year old male with no stomach and past medical history, admitted on 12/30/2011 for shortness of breath, and intermittent hemoptysis, started experiencing shortness of hemoptysis along with cough about one week ago after going out to the [place] to bird watch. Nonsmoker, states he is in good health otherwise. CTA chest negative for pulmonary embolus. Acute decline in respiratory status from nasal cannula to high flow oxygen to requiring intubation.  SUBJECTIVE:  Improved vent tolerance. Still requiring high FiO2 and an high doses of sedation. Improved vent dyssynchrony. Had one episode of emesis today. 2 feeds on hold. KUB of oral ordered. He nods in disapproval of pain  STUDIES:  CTA chest 2/12>> no PE, multilobar basilar pneumonia, mildly tight findings, pneumonitis  CXR images reviewed, slightly increased opacification, likely a pulmonary edema.  SIGNIFICANT EVENTS: 2/16>> worsening hypoxia requiring high flow nasal cannula, unable to wean, subsequently intubated  VITAL SIGNS: Temp:  [98 F (36.7 C)-99.4 F (37.4 C)] 98 F (36.7 C) (02/20 1200) Pulse Rate:  [65-138] 127 (02/20 1215) Resp:  [13-35] 22 (02/20 1215) BP: (70-168)/(45-99) 160/88 mmHg (02/20 1200) SpO2:  [87 %-100 %] 95 % (02/20 1215) FiO2 (%):  [70 %-80 %] 70 % (02/20 1155) Weight:  [60 kg (132 lb 4.4 oz)] 60 kg (132 lb 4.4 oz) (02/20 0531) HEMODYNAMICS: CVP:  [1 mmHg-15 mmHg] 13 mmHg VENTILATOR SETTINGS: Vent Mode:  [-] PCV FiO2 (%):  [70 %-80 %] 70 % Set Rate:  [14 bmp-20 bmp] 14 bmp Vt Set:  [390 mL] 390 mL PEEP:  [10 cmH20-14 cmH20] 10 cmH20 Plateau Pressure:  [28 cmH20] 28 cmH20 INTAKE / OUTPUT:  Intake/Output Summary (Last 24 hours) at 01/06/16 1317 Last data filed at 01/06/16 1216  Gross per 24 hour  Intake 2943.67 ml  Output   3125 ml  Net -181.33 ml    Review  of Systems  Unable to perform ROS: intubated    Physical Exam  Constitutional: He is well-developed, well-nourished, and in no distress.  HENT:  Head: Normocephalic.  Eyes: Pupils are equal, round, and reactive to light.  Neck: No JVD present.  Cardiovascular: Regular rhythm, normal heart sounds and intact distal pulses.   No murmur heard. Pulmonary/Chest: No respiratory distress. He has no wheezes. He has no rales.  No distress on the vent.  No wheezes Breath sounds diminished in the bases  Abdominal: Soft. He exhibits distension. There is no tenderness. There is no rebound.  Mild distention but soft, hypoactive bowel sounds  Genitourinary: Penis normal.  Musculoskeletal: He exhibits no edema.  Neurological: He displays normal reflexes. No cranial nerve deficit.  Moderately awake on the vent, following simple commands.   Skin: Skin is warm and dry. He is not diaphoretic.  Nursing note and vitals reviewed.    LABS:  CBC  Recent Labs Lab 01/04/16 0905 01/05/16 0426 01/06/16 0457  WBC 10.6 17.1* 22.4*  HGB 8.3* 9.7* 9.2*  HCT 24.3* 28.2* 27.4*  PLT 282 414 517*   Coag's  Recent Labs Lab Jan 28, 2016 1501  INR 1.01   BMET  Recent Labs Lab 01/04/16 0540 01/05/16 0426 01/06/16 0457  NA 138 137 140  K 3.8 2.9* 4.0  CL 102 100* 104  CO2 35* 30 32  BUN 32* 34* 26*  CREATININE 1.08 1.07 0.87  GLUCOSE 190* 249* 141*   Electrolytes  Recent Labs Lab 01/04/16 0540  01/05/16 0426 01/06/16 0457  CALCIUM 6.7* 7.0* 7.0*  MG 2.4 2.2 2.1  PHOS 3.1 2.1* 1.9*   Sepsis Markers  Recent Labs Lab 01/09/2016 1555 01/06/2016 1836  LATICACIDVEN 2.7* 1.6   ABG  Recent Labs Lab 01/04/16 0630 01/05/16 0450 01/06/16 0430  PHART 7.39 7.44 7.49*  PCO2ART 52* 46 43  PO2ART 55* 50* 56*   Liver Enzymes  Recent Labs Lab 01/03/2016 1501 01/03/16 0608  AST 85* 122*  ALT 41 101*  ALKPHOS 58 47  BILITOT 0.6 1.2  ALBUMIN 3.4* 2.1*   Cardiac Enzymes  Recent  Labs Lab 01/12/2016 1836 12/25/2015 1958 12/31/15 0522  TROPONINI 0.06* 0.05* 0.09*   Glucose  Recent Labs Lab 01/05/16 1553 01/05/16 1958 01/05/16 2358 01/06/16 0346 01/06/16 0721 01/06/16 1157  GLUCAP 188* 108* 102* 130* 122* 138*    CXR: NSC bilateral AS dz c/w edema vs ARDS   Micro: MRSA PCR 02/15 >> NEG Rapid flu 02/13 >> NEG Blood 2/13 >> 1/2 staph saccharolyticus (likely contaminant) Urine 02/13 >> NEG C diff 02/15 >> NEG Resp 02/15 >> light growth candida BAL 2/16 >> heavy growth yeast (candida albicans) HIV 02/20 >> NEG Resp 02/20 >>  Pneumocystis DFA 02/20 >>  Legionella DFA 02/20 >>    PCT 02/20:   Antibiotics: Azithro 02/13 >> 02/19 Ceftriaxone 02/13 >> 02/15 Vanc 2/15 >> 02/18 Zosyn 2/15 >>   Lines: R IJ CVL 02/16 >>  ETT 02/15 >>    ASSESSMENT / PLAN: 70 year old male no significant past medical history now with multifocal pneumonia, cough, dyspnea, accidental fall, requiring supplemental oxygen, chest CT concerning for interstitial lung disease versus multifocal pneumonia; now requiring high FiO2 and high doses of sedation   A: PULMONARY VDRF Bilateral pulmonary infiltrates - favor pneumonitis or ARDS or pulmonary edema Hemoptysis-resolved P:  Cont vent support - settings reviewed and/or adjusted  Better synchrony in PCV mode  Use ARDS PEEP/FiO2 algorithm Cont vent bundle Daily SBT if/when meets criteria   A: CARDIOVASCULAR Minimally elevated troponin - likely demand ischemia Hypertension Sinus tachycardia P:  Increase metoprolol 02/20 MAP goal > 65 mmHg  A: RENAL No issues P: Monitor BMET intermittently Monitor I/Os Correct electrolytes as indicated  A: GASTROINTESTINAL A: Melana Ileus  P: SUP: IV famotidine Resume TFs 01/06/16. Advance as tolrated  A: HEMATOLOGIC Acute blood loss anemia - no active bleeding evident presently P: DVT px: SCDs Monitor CBC intermittently Transfuse per usual  guidelines  INFECTIOUS A: Pneumonitis - if infectious, suspect atypical P: Monitor temp, WBC count Micro and abx as above Check HIV 02/20 >> NEG PCT protocol 02/20 - consider DC pip-tazo if normal  A: ENDOCRINE A: Hyperglycemia without prior dx of DM P: Continue SSI - mod scale Add Lantus 02/20   A: NEUROLOGIC ICU/vent associated discomfort P:  RASS goal: -1, -2 PAD protocol - fent/propofol   Family update: No family available on this day   CCM time: 45 mins The above time includes time spent in consultation with patient and/or family members and reviewing care plan on multidisciplinary rounds  Billy Fischer, MD PCCM service Mobile [phone] Pager [phone]

## 2016-01-06 NOTE — Progress Notes (Signed)
Pharmacy Antibiotic Note  Larry Pacheco is a 70 y.o. male admitted on 01/09/2016 with pneumonia and sepsis.  Pharmacy consulted for Zosyn dosing.   Plan:  Per pulmonologist, [place] Will continue patient on Zosyn for now. MD will order Pro-calcitonin and go from there. For now, will continue Zosyn 3.375 g IV q8hours.    Height:  (167.6 cm) Weight: 132 lb 4.4 oz (60 kg) IBW/kg (Calculated) : 63.8  Temp (24hrs), Avg:98.7 F (37.1 C), Min:98 F (36.7 C), Max:99.4 F (37.4 C)   Recent Labs Lab 01/04/2016 1555 01/12/2016 1836  01/01/16 0207 01/03/16 1610 01/04/16 0540 01/04/16 0905 01/05/16 0426 01/06/16 0457  WBC  --   --   < > 12.3* 15.2*  --  10.6 17.1* 22.4*  CREATININE  --   --   < > 1.14 1.24 1.08  --  1.07 0.87  LATICACIDVEN 2.7* 1.6  --   --   --   --   --   --   --   < > = values in this interval not displayed.  Estimated Creatinine Clearance: 66.1 mL/min (by C-G formula based on Cr of 0.87).    No Known Allergies   Antibiotics:  Ceftriaxone 2/13 >>2/14 Azithromycin 2/13 >>2/14 Vanc 2/15>>2/15 Zosyn 2/15>> Azithromycin 02/16 >>  Microbiology:  02/13 Flu: negative 02/15 MRSA PCR: negative 02/13 Urine cx: negative 02/15 GI panel: negative 02/15 C diff: negative 02/15 Sputum cx: pending 02/15 GPC in 1/4 bottles  Pharmacy will continue to monitor and adjust per consult.  Demetrius Charity, RPh Clinical Pharmacist  01/06/2016 12:05 PM

## 2016-01-06 NOTE — Clinical Social Work Note (Signed)
Patient remains intubated and sedated at this time.  York Spaniel MSW,LCSW [phone]

## 2016-01-07 ENCOUNTER — Inpatient Hospital Stay: Payer: Medicare Other

## 2016-01-07 LAB — GLUCOSE, CAPILLARY
Glucose-Capillary: 168 mg/dL — ABNORMAL HIGH (ref 65–99)
Glucose-Capillary: 165 mg/dL — ABNORMAL HIGH (ref 65–99)
Glucose-Capillary: 170 mg/dL — ABNORMAL HIGH (ref 65–99)
Glucose-Capillary: 185 mg/dL — ABNORMAL HIGH (ref 65–99)
Glucose-Capillary: 166 mg/dL — ABNORMAL HIGH (ref 65–99)

## 2016-01-07 LAB — BASIC METABOLIC PANEL
ANION GAP: 3 — AB (ref 5–15)
BUN: 27 mg/dL — ABNORMAL HIGH (ref 6–20)
CALCIUM: 7.3 mg/dL — AB (ref 8.9–10.3)
CO2: 35 mmol/L — AB (ref 22–32)
Chloride: 104 mmol/L (ref 101–111)
Creatinine, Ser: 0.91 mg/dL (ref 0.61–1.24)
GFR calc Af Amer: >60 mL/min (ref >60)
GFR calc non Af Amer: >60 mL/min (ref >60)
GLUCOSE: 190 mg/dL — AB (ref 65–99)
POTASSIUM: 4.4 mmol/L (ref 3.5–5.1)
Sodium: 142 mmol/L (ref 135–145)

## 2016-01-07 LAB — CBC
HEMATOCRIT: 27.4 % — AB (ref 40.0–52.0)
HEMOGLOBIN: 9 g/dL — AB (ref 13.0–18.0)
MCH: 29.6 pg (ref 26.0–34.0)
MCHC: 32.9 g/dL (ref 32.0–36.0)
MCV: 90 fL (ref 80.0–100.0)
Platelets: 588 10*3/uL — ABNORMAL HIGH (ref 150–440)
RBC: 3.04 MIL/uL — ABNORMAL LOW (ref 4.40–5.90)
RDW: 14.1 % (ref 11.5–14.5)
WBC: 26.9 10*3/uL — ABNORMAL HIGH (ref 3.8–10.6)

## 2016-01-07 LAB — MISC LABCORP TEST (SEND OUT)
LABCORP TEST CODE: 85506
Labcorp test code: 180232

## 2016-01-07 LAB — ANCA TITERS
C-ANCA: <1:20 {titer}
P-ANCA: <1:20 {titer}

## 2016-01-07 LAB — ANA W/REFLEX IF POSITIVE: ANA: NEGATIVE

## 2016-01-07 LAB — TRIGLYCERIDES: Triglycerides: 122 mg/dL (ref <150)

## 2016-01-07 MED ORDER — HEPARIN SODIUM (PORCINE) 5000 UNIT/ML IJ SOLN
5000.0000 [IU] | Freq: Two times a day (BID) | INTRAMUSCULAR | Status: DC
  Administered 2016-01-07 – 2016-01-15 (×17): 5000 [IU] via SUBCUTANEOUS
  Filled 2016-01-07 (×17): qty 1

## 2016-01-07 MED ORDER — DILTIAZEM HCL 30 MG PO TABS
30.0000 mg | ORAL_TABLET | Freq: Four times a day (QID) | ORAL | Status: DC
  Administered 2016-01-07 – 2016-01-11 (×16): 30 mg via ORAL
  Filled 2016-01-07 (×16): qty 1

## 2016-01-07 MED ORDER — PRO-STAT SUGAR FREE PO LIQD
30.0000 mL | Freq: Every day | ORAL | Status: DC
  Administered 2016-01-07 – 2016-01-14 (×8): 30 mL

## 2016-01-07 MED ORDER — SENNOSIDES-DOCUSATE SODIUM 8.6-50 MG PO TABS
1.0000 | ORAL_TABLET | Freq: Two times a day (BID) | ORAL | Status: DC
  Administered 2016-01-07 – 2016-01-08 (×3): 1 via ORAL
  Filled 2016-01-07 (×3): qty 1

## 2016-01-07 MED ORDER — VITAL AF 1.2 CAL PO LIQD
1000.0000 mL | ORAL | Status: DC
Start: 2016-01-07 — End: 2016-01-13
  Administered 2016-01-07 – 2016-01-12 (×7): 1000 mL

## 2016-01-07 MED ORDER — METOPROLOL TARTRATE 1 MG/ML IV SOLN: 5.0000 mg | INTRAVENOUS | Status: DC | PRN

## 2016-01-07 MED ORDER — FUROSEMIDE 10 MG/ML IJ SOLN
60.0000 mg | Freq: Once | INTRAMUSCULAR | Status: AC
  Administered 2016-01-07: 60 mg via INTRAVENOUS
  Filled 2016-01-07: qty 6

## 2016-01-07 MED ORDER — METOPROLOL TARTRATE 1 MG/ML IV SOLN
5.0000 mg | INTRAVENOUS | Status: DC | PRN
  Filled 2016-01-07: qty 5

## 2016-01-07 NOTE — Progress Notes (Signed)
Nutrition Follow-up     INTERVENTION:   EN: with current diprivan, recommend continuing current goal rate of 40 ml/hr, Prostat decreased to daily. Continue to assess   NUTRITION DIAGNOSIS:   Inadequate oral intake related to acute illness as evidenced by meal completion < 50%. Being addressed via TF while on vent  GOAL:   Patient will meet greater than or equal to 90% of their needs  MONITOR:    (Energy Intake, Anthropometrics, Digestive System)  REASON FOR ASSESSMENT:   Consult Enteral/tube feeding initiation and management  ASSESSMENT:   Pt remains on vent  Diet Order:  Diet NPO time specified   EN: tolerating Vital AF 1.2 at rate of 40 ml/hr this AM  Digestive System: abdomen distended/soft, last documented BM 2/18, bowel regimen to be adjusted today per MD Ramachandran  Skin:  Reviewed, no issues   Recent Labs Lab 01/04/16 0540 01/05/16 0426 01/06/16 0457 01/07/16 0441  NA 138 137 140 142  K 3.8 2.9* 4.0 4.4  CL 102 100* 104 104  CO2 35* 30 32 35*  BUN 32* 34* 26* 27*  CREATININE 1.08 1.07 0.87 0.91  CALCIUM 6.7* 7.0* 7.0* 7.3*  MG 2.4 2.2 2.1  --   PHOS 3.1 2.1* 1.9*  --   GLUCOSE 190* 249* 141* 190*    Glucose Profile:  Recent Labs  01/06/16 2341 01/07/16 0349 01/07/16 0718  GLUCAP 177* 168* 165*   Meds: ss novolog, colace, lantus, solumedrol, diprivan 262 kcals in 24 hours per I/O   Height:   Ht Readings from Last 1 Encounters:  January 05, 2016  (1.676 m)    Weight:   Wt Readings from Last 1 Encounters:  01/07/16 133 lb 2.5 oz (60.4 kg)    Filed Weights   01/05/16 0500 01/06/16 0531 01/07/16 0405  Weight: 130 lb 1.1 oz (59 kg) 132 lb 4.4 oz (60 kg) 133 lb 2.5 oz (60.4 kg)    BMI:  Body mass index is 21.5 kg/(m^2).  Estimated Nutritional Needs:   Kcal:  1468 kcals (BEE 1243, Ve: 5.1, Tmax: 37.9) using wt of 55 kg  Protein:  83-110 g (1.5-2.0 g/kg)   Fluid:  1375-1650 mL (25-30 ml/kg)   HIGH Care Level  Romelle Starcher MS,  RD, LDN [phone] Pager  [phone] Weekend/On-Call Pager

## 2016-01-07 NOTE — Progress Notes (Signed)
PULMONARY / CRITICAL CARE MEDICINE   Name: Larry Pacheco MRN: [id number] DOB: 09-07-1946    ADMISSION DATE:  January 17, 2016  BRIEF HISTORY: 70 year old male with no stomach and past medical history, admitted on 12/30/2011 for shortness of breath, and intermittent hemoptysis, started experiencing shortness of hemoptysis along with cough about one week ago after going out to the [place] to bird watch. Nonsmoker, states he is in good health otherwise. CTA chest negative for pulmonary embolus. Acute decline in respiratory status from nasal cannula to high flow oxygen to requiring intubation.  SUBJECTIVE:  Improved vent tolerance. Still requiring high FiO2 and an high doses of sedation. Improved vent dyssynchrony. Had one episode of emesis today. 2 feeds on hold. KUB of oral ordered. He nods in disapproval of pain  STUDIES:  CTA chest 2/12>> no PE, multilobar basilar pneumonia, mildly tight findings, pneumonitis  CXR images reviewed, unchanged infiltrates.   SIGNIFICANT EVENTS: 2/16>> worsening hypoxia requiring high flow nasal cannula, unable to wean, subsequently intubated  VITAL SIGNS: Temp:  [98.7 F (37.1 C)-100.4 F (38 C)] 99.2 F (37.3 C) (02/21 0700) Pulse Rate:  [69-143] 117 (02/21 1600) Resp:  [13-36] 23 (02/21 1600) BP: (90-179)/(54-112) 175/109 mmHg (02/21 1600) SpO2:  [82 %-100 %] 100 % (02/21 1600) FiO2 (%):  [60 %-100 %] 90 % (02/21 1610) Weight:  [133 lb 2.5 oz (60.4 kg)] 133 lb 2.5 oz (60.4 kg) (02/21 0405) HEMODYNAMICS: CVP:  [7 mmHg-10 mmHg] 10 mmHg VENTILATOR SETTINGS: Vent Mode:  [-] PCV FiO2 (%):  [60 %-100 %] 90 % Set Rate:  [14 bmp] 14 bmp PEEP:  [10 cmH20] 10 cmH20 Plateau Pressure:  [12 cmH20-22 cmH20] 12 cmH20 INTAKE / OUTPUT:  Intake/Output Summary (Last 24 hours) at 01/07/16 1704 Last data filed at 01/07/16 0719  Gross per 24 hour  Intake 1398.52 ml  Output   1475 ml  Net -76.48 ml    Review of Systems  Unable to perform ROS: intubated     Physical Exam  Constitutional: He is well-developed, well-nourished, and in no distress.  HENT:  Head: Normocephalic.  Eyes: Pupils are equal, round, and reactive to light.  Neck: No JVD present.  Cardiovascular: Regular rhythm, normal heart sounds and intact distal pulses.   No murmur heard. Pulmonary/Chest: No respiratory distress. He has no wheezes. He has no rales.  No distress on the vent.  No wheezes Breath sounds diminished in the bases  Abdominal: Soft. He exhibits distension. There is no tenderness. There is no rebound.  Mild distention but soft, hypoactive bowel sounds  Genitourinary: Penis normal.  Musculoskeletal: He exhibits no edema.  Neurological: He displays normal reflexes. No cranial nerve deficit.  Moderately awake on the vent, following simple commands.   Skin: Skin is warm and dry. He is not diaphoretic.  Nursing note and vitals reviewed.    LABS:  CBC  Recent Labs Lab 01/05/16 0426 01/06/16 0457 01/07/16 0441  WBC 17.1* 22.4* 26.9*  HGB 9.7* 9.2* 9.0*  HCT 28.2* 27.4* 27.4*  PLT 414 517* 588*   Coag's No results for input(s): APTT, INR in the last 168 hours. BMET  Recent Labs Lab 01/05/16 0426 01/06/16 0457 01/07/16 0441  NA 137 140 142  K 2.9* 4.0 4.4  CL 100* 104 104  CO2 30 32 35*  BUN 34* 26* 27*  CREATININE 1.07 0.87 0.91  GLUCOSE 249* 141* 190*   Electrolytes  Recent Labs Lab 01/04/16 0540 01/05/16 0426 01/06/16 0457 01/07/16 0441  CALCIUM 6.7* 7.0* 7.0* 7.3*  MG 2.4 2.2 2.1  --   PHOS 3.1 2.1* 1.9*  --    Sepsis Markers No results for input(s): LATICACIDVEN, PROCALCITON, O2SATVEN in the last 168 hours. ABG  Recent Labs Lab 01/04/16 0630 01/05/16 0450 01/06/16 0430  PHART 7.39 7.44 7.49*  PCO2ART 52* 46 43  PO2ART 55* 50* 56*   Liver Enzymes  Recent Labs Lab 01/03/16 0608  AST 122*  ALT 101*  ALKPHOS 47  BILITOT 1.2  ALBUMIN 2.1*   Cardiac Enzymes No results for input(s): TROPONINI, PROBNP in  the last 168 hours. Glucose  Recent Labs Lab 01/06/16 1942 01/06/16 2341 01/07/16 0349 01/07/16 0718 01/07/16 1135 01/07/16 1602  GLUCAP 109* 177* 168* 165* 170* 185*    CXR: NSC bilateral AS dz c/w edema vs ARDS   Micro: MRSA PCR 02/15 >> NEG Rapid flu 02/13 >> NEG Blood 2/13 >> 1/2 staph saccharolyticus (likely contaminant) Urine 02/13 >> NEG C diff 02/15 >> NEG Resp 02/15 >> light growth candida BAL 2/16 >> heavy growth yeast (candida albicans) HIV 02/20 >> NEG Resp 02/20 >>  Pneumocystis DFA 02/20 >>  Legionella DFA 02/20 >>    PCT 02/20:   Antibiotics: Azithro 02/13 >> 02/19 Ceftriaxone 02/13 >> 02/15 Vanc 2/15 >> 02/18 Zosyn 2/15 >>   Lines: R IJ CVL 02/16 >>  ETT 02/15 >>    ASSESSMENT / PLAN: 70 year old male no significant past medical history now with multifocal pneumonia, cough, dyspnea, accidental fall, requiring supplemental oxygen, chest CT concerning for interstitial lung disease versus multifocal pneumonia; now requiring high FiO2 and high doses of sedation   A: PULMONARY VDRF Bilateral pulmonary infiltrates - favor pneumonitis or ARDS or pulmonary edema Hemoptysis-resolved Not candidate for weaning today.  P:  Cont vent support - settings reviewed and/or adjusted  Better synchrony in PCV mode  Use ARDS PEEP/FiO2 algorithm Cont vent bundle Daily SBT if/when meets criteria   A: CARDIOVASCULAR Minimally elevated troponin - likely demand ischemia Hypertension Sinus tachycardia P:  Increase metoprolol 02/20 MAP goal > 65 mmHg  A: RENAL No issues P: Monitor BMET intermittently Monitor I/Os Correct electrolytes as indicated  A: GASTROINTESTINAL A: Melana Ileus  P: SUP: IV famotidine Continue tube feeds.   A: HEMATOLOGIC Acute blood loss anemia - no active bleeding evident presently P: DVT px: SCDs Monitor CBC intermittently Transfuse per usual guidelines  INFECTIOUS A: Pneumonitis - if infectious, suspect  atypical P: Monitor temp, WBC count Micro and abx as above Check HIV 02/20 >> NEG PCT protocol 02/20 - consider DC pip-tazo if normal  A: ENDOCRINE A: Hyperglycemia without prior dx of DM P: Continue SSI - mod scale Add Lantus 02/20   A: NEUROLOGIC ICU/vent associated discomfort P:  RASS goal: -1, -2 PAD protocol - fent/propofol  Deep Nicholos Johns, M.D.   Critical Care Attestation.  I have personally obtained a history, examined the patient, evaluated laboratory and imaging results, formulated the assessment and plan and placed orders. The Patient requires high complexity decision making for assessment and support, frequent evaluation and titration of therapies, application of advanced monitoring technologies and extensive interpretation of multiple databases. The patient has critical illness that could lead imminently to failure of 1 or more organ systems and requires the highest level of physician preparedness to intervene.  Critical Care Time devoted to patient care services described in this note is 35 minutes and is exclusive of time spent in procedures.

## 2016-01-07 NOTE — Progress Notes (Signed)
Patient ID: Larry Pacheco, male   DOB: 01-12-1946, 70 y.o.   MRN: [id number] [facility] PROGRESS NOTE  Larry Pacheco WUJ:[id number] DOB: August 06, 1946 DOA: 01/09/2016 PCP: No primary care provider on file.  HPI/Subjective: Patient intubated and sedated. Patient open eyes to my voice. Able to wiggle his toes to command.  Objective: Filed Vitals:   01/07/16 1300 01/07/16 1400  BP: 178/94 179/98  Pulse: 120 126  Temp:    Resp: 26 30    Filed Weights   01/05/16 0500 01/06/16 0531 01/07/16 0405  Weight: 59 kg (130 lb 1.1 oz) 60 kg (132 lb 4.4 oz) 60.4 kg (133 lb 2.5 oz)    ROS: ROS Limited secondary to being on the ventilator Exam: Physical Exam  Constitutional: He is intubated.  HENT:  Nose: No mucosal edema.  Unable to look and mouth  Eyes:  Pupils pinpoint  Neck: Normal carotid pulses present. Carotid bruit is not present.  Cardiovascular: S1 normal and S2 normal.  Tachycardia present.   Pulses:      Dorsalis pedis pulses are 1+ on the right side, and 1+ on the left side.  Respiratory: He is intubated. He has decreased breath sounds in the right middle field, the right lower field and the left middle field. He has wheezes in the right lower field and the left lower field. He has rhonchi in the right middle field, the right lower field, the left middle field and the left lower field.  GI: Soft. Bowel sounds are normal. There is no tenderness.  Musculoskeletal:       Right ankle: He exhibits no swelling.       Left ankle: He exhibits no swelling.  Neurological:  Patient intubated and sedated  Skin: Skin is warm. No rash noted. Nails show no clubbing.  Psychiatric:  Patient intubated      Data Reviewed: Basic Metabolic Panel:  Recent Labs Lab 01/03/16 0608 01/03/16 2303 01/03/16 2308 01/04/16 0540 01/05/16 0426 01/06/16 0457 01/07/16 0441  NA 139  --   --  138 137 140 142  K 3.0* 3.3*  --  3.8 2.9* 4.0 4.4  CL 101  --   --  102 100* 104 104  CO2 30   --   --  35* 30 32 35*  GLUCOSE 190*  --   --  190* 249* 141* 190*  BUN 29*  --   --  32* 34* 26* 27*  CREATININE 1.24  --   --  1.08 1.07 0.87 0.91  CALCIUM 7.1*  --   --  6.7* 7.0* 7.0* 7.3*  MG 2.5*  --   --  2.4 2.2 2.1  --   PHOS  --   --  1.8* 3.1 2.1* 1.9*  --    Liver Function Tests:  Recent Labs Lab 01/03/16 0608  AST 122*  ALT 101*  ALKPHOS 47  BILITOT 1.2  PROT 5.4*  ALBUMIN 2.1*   CBC:  Recent Labs Lab 01/03/16 0608 01/04/16 0905 01/05/16 0426 01/06/16 0457 01/07/16 0441  WBC 15.2* 10.6 17.1* 22.4* 26.9*  NEUTROABS 14.3*  --   --   --   --   HGB 10.3* 8.3* 9.7* 9.2* 9.0*  HCT 30.8* 24.3* 28.2* 27.4* 27.4*  MCV 89.6 88.6 89.0 89.1 90.0  PLT 288 282 414 517* 588*   CBG:  Recent Labs Lab 01/06/16 2341 01/07/16 0349 01/07/16 0718 01/07/16 1135 01/07/16 1602  GLUCAP 177* 168* 165* 170* 185*    Recent Results (  from the past 240 hour(s))  Rapid Influenza A&B Antigens ([facility] only)     Status: None   Collection Time: 12/29/2015  3:01 PM  Result Value Ref Range Status   Influenza A ([facility]) NEGATIVE  Final   Influenza B ([facility]) NEGATIVE  Final  Blood Culture (routine x 2)     Status: None   Collection Time: 01/14/2016  3:55 PM  Result Value Ref Range Status   Specimen Description BLOOD RIGHT ASSIST CONTROL  Final   Special Requests   Final    BOTTLES DRAWN AEROBIC AND ANAEROBIC  6CC AERO, 5CC ANA   Culture  Setup Time   Final    GRAM POSITIVE COCCI ANAEROBIC BOTTLE ONLY CRITICAL RESULT CALLED TO, READ BACK BY AND VERIFIED WITH: Thurman Coyer JAMES 01/02/16 1250PM MLM Organism ID to follow    Culture   Final    STAPHYLOCOCCUS SACCHAROLYTICUS ANAEROBIC BOTTLE ONLY Results consistent with contamination.    Report Status 01/05/2016 FINAL  Final  Blood Culture ID Panel (Reflexed)     Status: None   Collection Time: 01/10/2016  3:55 PM  Result Value Ref Range Status   Enterococcus species NOT DETECTED NOT DETECTED Final   Listeria monocytogenes NOT DETECTED NOT  DETECTED Final   Staphylococcus species NOT DETECTED NOT DETECTED Final   Staphylococcus aureus NOT DETECTED NOT DETECTED Final   Streptococcus species NOT DETECTED NOT DETECTED Final   Streptococcus agalactiae NOT DETECTED NOT DETECTED Final   Streptococcus pneumoniae NOT DETECTED NOT DETECTED Final   Streptococcus pyogenes NOT DETECTED NOT DETECTED Final   Acinetobacter baumannii NOT DETECTED NOT DETECTED Final   Enterobacteriaceae species NOT DETECTED NOT DETECTED Final   Enterobacter cloacae complex NOT DETECTED NOT DETECTED Final   Escherichia coli NOT DETECTED NOT DETECTED Final   Klebsiella oxytoca NOT DETECTED NOT DETECTED Final   Klebsiella pneumoniae NOT DETECTED NOT DETECTED Final   Proteus species NOT DETECTED NOT DETECTED Final   Serratia marcescens NOT DETECTED NOT DETECTED Final   Haemophilus influenzae NOT DETECTED NOT DETECTED Final   Neisseria meningitidis NOT DETECTED NOT DETECTED Final   Pseudomonas aeruginosa NOT DETECTED NOT DETECTED Final   Candida albicans NOT DETECTED NOT DETECTED Final   Candida glabrata NOT DETECTED NOT DETECTED Final   Candida krusei NOT DETECTED NOT DETECTED Final   Candida parapsilosis NOT DETECTED NOT DETECTED Final   Candida tropicalis NOT DETECTED NOT DETECTED Final   Carbapenem resistance NOT DETECTED NOT DETECTED Final   Methicillin resistance NOT DETECTED NOT DETECTED Final   Vancomycin resistance NOT DETECTED NOT DETECTED Final  Blood Culture (routine x 2)     Status: None   Collection Time: 01/13/2016  4:01 PM  Result Value Ref Range Status   Specimen Description BLOOD LEFT ASSIST CONTROL  Final   Special Requests   Final    BOTTLES DRAWN AEROBIC AND ANAEROBIC  3CC AERO, 2CC ANA   Culture NO GROWTH 5 DAYS  Final   Report Status 01/04/2016 FINAL  Final  Urine culture     Status: None   Collection Time: 01/07/2016  6:36 PM  Result Value Ref Range Status   Specimen Description URINE, RANDOM  Final   Special Requests NONE  Final    Culture NO GROWTH 1 DAY  Final   Report Status 01/01/2016 FINAL  Final  MRSA PCR Screening     Status: None   Collection Time: 01/01/16  2:08 AM  Result Value Ref Range Status   MRSA by PCR NEGATIVE NEGATIVE  Final    Comment:        The GeneXpert MRSA Assay (FDA approved for NASAL specimens only), is one component of a comprehensive MRSA colonization surveillance program. It is not intended to diagnose MRSA infection nor to guide or monitor treatment for MRSA infections.   Gastrointestinal Panel by PCR , Stool     Status: None   Collection Time: 01/01/16  6:59 AM  Result Value Ref Range Status   Campylobacter species NOT DETECTED NOT DETECTED Final   Plesimonas shigelloides NOT DETECTED NOT DETECTED Final   Salmonella species NOT DETECTED NOT DETECTED Final   Yersinia enterocolitica NOT DETECTED NOT DETECTED Final   Vibrio species NOT DETECTED NOT DETECTED Final   Vibrio cholerae NOT DETECTED NOT DETECTED Final   Enteroaggregative E coli (EAEC) NOT DETECTED NOT DETECTED Final   Enteropathogenic E coli (EPEC) NOT DETECTED NOT DETECTED Final   Enterotoxigenic E coli (ETEC) NOT DETECTED NOT DETECTED Final   Shiga like toxin producing E coli (STEC) NOT DETECTED NOT DETECTED Final   E. coli O157 NOT DETECTED NOT DETECTED Final   Shigella/Enteroinvasive E coli (EIEC) NOT DETECTED NOT DETECTED Final   Cryptosporidium NOT DETECTED NOT DETECTED Final   Cyclospora cayetanensis NOT DETECTED NOT DETECTED Final   Entamoeba histolytica NOT DETECTED NOT DETECTED Final   Giardia lamblia NOT DETECTED NOT DETECTED Final   Adenovirus F40/41 NOT DETECTED NOT DETECTED Final   Astrovirus NOT DETECTED NOT DETECTED Final   Norovirus GI/GII NOT DETECTED NOT DETECTED Final   Rotavirus A NOT DETECTED NOT DETECTED Final   Sapovirus (I, II, IV, and V) NOT DETECTED NOT DETECTED Final  C difficile quick scan w PCR reflex     Status: None   Collection Time: 01/01/16  6:59 AM  Result Value Ref Range Status    C Diff antigen NEGATIVE NEGATIVE Final   C Diff toxin NEGATIVE NEGATIVE Final   C Diff interpretation Negative for C. difficile  Final  Culture, expectorated sputum-assessment     Status: None   Collection Time: 01/01/16 10:46 AM  Result Value Ref Range Status   Specimen Description SPUTUM  Final   Special Requests Normal  Final   Sputum evaluation THIS SPECIMEN IS ACCEPTABLE FOR SPUTUM CULTURE  Final   Report Status 01/01/2016 FINAL  Final  Culture, respiratory (NON-Expectorated)     Status: None   Collection Time: 01/01/16 10:46 AM  Result Value Ref Range Status   Specimen Description SPUTUM  Final   Special Requests Normal Reflexed from [id number]  Final   Gram Stain   Final    FAIR SPECIMEN - 70-80% WBCS FEW SQUAMOUS EPITHELIAL CELLS PRESENT MODERATE WBC SEEN MODERATE YEAST FEW GRAM NEGATIVE RODS RARE GRAM POSITIVE COCCI IN PAIRS    Culture LIGHT GROWTH CANDIDA ALBICANS  Final   Report Status 01/04/2016 FINAL  Final  Fungus Culture with Smear     Status: None (Preliminary result)   Collection Time: 01/02/16  2:52 PM  Result Value Ref Range Status   Specimen Description BRONCHIAL WASHINGS  Final   Special Requests Normal  Final   Culture CANDIDA ALBICANS  Final   Report Status PENDING  Incomplete  Culture, bal-quantitative     Status: None   Collection Time: 01/02/16  2:52 PM  Result Value Ref Range Status   Specimen Description BRONCHIAL WASHINGS  Final   Special Requests Normal  Final   Gram Stain   Final    FAIR SPECIMEN - 70-80% WBCS FEW SQUAMOUS  EPITHELIAL CELLS PRESENT MODERATE WBC SEEN MANY YEAST    Culture HEAVY GROWTH CANDIDA ALBICANS  Final   Report Status 01/06/2016 FINAL  Final  Culture, blood (Routine X 2) w Reflex to ID Panel     Status: None (Preliminary result)   Collection Time: 01/06/16 10:42 AM  Result Value Ref Range Status   Specimen Description BLOOD RIGHT ASSIST CONTROL  Final   Special Requests BOTTLES DRAWN AEROBIC AND ANAEROBIC  7CCAERO,7CCANA  Final   Culture NO GROWTH < 24 HOURS  Final   Report Status PENDING  Incomplete  Culture, blood (Routine X 2) w Reflex to ID Panel     Status: None (Preliminary result)   Collection Time: 01/06/16 10:51 AM  Result Value Ref Range Status   Specimen Description BLOOD LEFT ASSIST CONTROL  Final   Special Requests BOTTLES DRAWN AEROBIC AND ANAEROBIC 5CCAERO,5CCANA  Final   Culture NO GROWTH < 24 HOURS  Final   Report Status PENDING  Incomplete  Culture, respiratory (NON-Expectorated)     Status: None (Preliminary result)   Collection Time: 01/06/16 12:08 PM  Result Value Ref Range Status   Specimen Description TRACHEAL ASPIRATE  Final   Special Requests NONE  Final   Gram Stain   Final    FEW WBC SEEN MANY YEAST GOOD SPECIMEN - 80-90% WBCS    Culture HEAVY GROWTH YEAST IDENTIFICATION TO FOLLOW   Final   Report Status PENDING  Incomplete     Studies: Dg Chest 1 View  01/06/2016  CLINICAL DATA:  Dyspnea, community-acquired pneumonia, sepsis, hemoptysis. EXAM: CHEST 1 VIEW COMPARISON:  Portable chest x-ray of January 05, 2016 FINDINGS: The lungs are well-expanded. There remain confluent increased interstitial densities bilaterally. There is no pneumothorax or pleural effusion. The heart is normal in size. The pulmonary vascularity is indistinct but no more than mildly engorged. The mediastinum is normal in width. The endotracheal tube tip lies 4.9 cm above the carina. The esophagogastric tube tip projects below the inferior margin of the image. The right internal jugular venous catheter tip projects over the midportion of the SVC. IMPRESSION: Stable bilateral interstitial densities most compatible with edema or pneumonia. There is no pneumothorax or pleural effusion. The support tubes are in reasonable position. Electronically Signed   By: David  [place] M.D.   On: 01/06/2016 07:15   Dg Chest Port 1 View  01/07/2016  CLINICAL DATA:  Respiratory failure. EXAM: PORTABLE CHEST 1  VIEW COMPARISON:  01/06/2016 . FINDINGS: Endotracheal tube and NG tube in stable position. Right IJ line stable position. Mild cardiomegaly. Progressive bilateral pulmonary alveolar infiltrates. Findings could be secondary to pulmonary edema. Progressive bilateral pneumonia could also present this fashion. No pleural effusion or pneumothorax. IMPRESSION: 1. Lines and tubes in stable position. 2. Mild cardiomegaly. Progressive bilateral pulmonary alveolar infiltrates. These findings could be secondary to pulmonary edema. Bilateral pneumonia could also present in this fashion. Electronically Signed   By: Maisie Fus  Register   On: 01/07/2016 07:26    Scheduled Meds: . antiseptic oral rinse  7 mL Mouth Rinse QID  . chlorhexidine gluconate  15 mL Mouth Rinse BID  . diltiazem  30 mg Oral 4 times per day  . famotidine  20 mg Per Tube BID  . feeding supplement (PRO-STAT SUGAR FREE 64)  30 mL Per Tube Daily  . free water  200 mL Per Tube 3 times per day  . furosemide  60 mg Intravenous Once  . heparin subcutaneous  5,000 Units Subcutaneous Q12H  .  insulin aspart  0-15 Units Subcutaneous 6 times per day  . insulin glargine  10 Units Subcutaneous Daily  . methylPREDNISolone (SOLU-MEDROL) injection  40 mg Intravenous Q12H  . metoprolol tartrate  50 mg Per Tube BID  . piperacillin-tazobactam  3.375 g Intravenous 3 times per day  . senna-docusate  1 tablet Oral BID   Continuous Infusions: . sodium chloride 10 mL/hr at 01/07/16 0425  . feeding supplement (VITAL AF 1.2 CAL) 1,000 mL (01/07/16 1044)  . fentaNYL infusion INTRAVENOUS 250 mcg/hr (01/07/16 0919)  . propofol (DIPRIVAN) infusion 20 mcg/kg/min (01/07/16 1610)    Assessment/Plan:  1. Clinical sepsis with pneumonia bilaterally. Patient is on Zosyn and Zithromax course was completed. Blood culture drawn on admission is a Skin contamination. Repeat cultures have been negative. Sputum culture from bronchial culture only grew out yeast. Patient having  fever. I will get an infectious disease consultation. White blood cell count going up. 2. Septic shock- levophed has been stopped. 3. Acute respiratory failure with hypoxia. Secondary to rapidly progressing pneumonia. Bronchoscopy showing yeast. Patient on 100% FiO2. anca negative. Patient with worsening chest x-ray findings. I'll give 1 dose of Lasix at this point. 4. GI bleed with drop in hemoglobin. Today's hemoglobin is higher at 9.0. Patient is on Protonix. 5. Nutrition - continue tube feeds, yesterday 6. Essential hypertension, tachycardia- patient started on metoprolol and Cardizem added today. Blood pressure and heart rate very labile 7. Hypokalemia, hypophosphatemia- kphos ordered  Code Status:     Code Status Orders        Start     Ordered   12/28/2015 1823  Full code   Continuous     01/10/2016 1823    Code Status History    Date Active Date Inactive Code Status Order ID Comments User Context   This patient has a current code status but no historical code status.    Advance Directive Documentation        Most Recent Value   Type of Advance Directive  Living will   Pre-existing out of facility DNR order (yellow form or pink MOST form)     "MOST" Form in Place?       Family Communication: Spoke with sister on the phone. Disposition Plan: To be determined  Consultants:  Critical care specialist  Gastroenterologist  Infectious disease consultation  Procedures:  Intubation  bronchoscopy  Antibiotics:  Zosyn   Zithromax (finished)  Time spent: 34 minutes critical care time  Alford Highland  [facility]

## 2016-01-07 NOTE — Progress Notes (Signed)
Mr . Palka remains on ventilator. Vent o2 increased due to hypoxia o2 sats decreasing. Pt placed on soft restraints per Dr. Nicholos Johns. No signs of skin breakdown. Assess site as ordered. Pt sister, Larry Pacheco is aware, educated of pt being placed on restraints. Pt has been tachycardic. Prn metop ordered for HR greater than 130. Will continue to monitor.

## 2016-01-07 NOTE — Progress Notes (Signed)
Pharmacy Antibiotic Note  Larry Pacheco is a 70 y.o. male admitted on January 12, 2016 with pneumonia and sepsis.  Pharmacy consulted for Zosyn dosing.   Plan:  Will continue Zosyn 3.375 g IV q8 hours for now. Will follow up with MD Nicholos Johns during ICU rounds.   Height:  (167.6 cm) Weight: 133 lb 2.5 oz (60.4 kg) IBW/kg (Calculated) : 63.8  Temp (24hrs), Avg:99.2 F (37.3 C), Min:98 F (36.7 C), Max:100.4 F (38 C)   Recent Labs Lab 01/03/16 0608 01/04/16 0540 01/04/16 0905 01/05/16 0426 01/06/16 0457 01/07/16 0441  WBC 15.2*  --  10.6 17.1* 22.4* 26.9*  CREATININE 1.24 1.08  --  1.07 0.87 0.91    Estimated Creatinine Clearance: 63.6 mL/min (by C-G formula based on Cr of 0.91).    No Known Allergies   Antibiotics:  Ceftriaxone 2/13 >>2/14 Azithromycin 2/13 >>2/14 Vanc 2/15>>2/15 Zosyn 2/15>> Azithromycin 02/16 >>  Microbiology:  02/13 Flu: negative 02/15 MRSA PCR: negative 02/13 Urine cx: negative 02/15 GI panel: negative 02/15 C diff: negative 02/15 Sputum cx: pending 02/15 GPC in 1/4 bottles  Pharmacy will continue to monitor and adjust per consult.  Demetrius Charity, RPh Clinical Pharmacist  01/07/2016 11:41 AM

## 2016-01-08 ENCOUNTER — Inpatient Hospital Stay: Payer: Medicare Other

## 2016-01-08 ENCOUNTER — Encounter: Payer: Self-pay | Admitting: Radiology

## 2016-01-08 LAB — CBC
HEMATOCRIT: 27.1 % — AB (ref 40.0–52.0)
HEMOGLOBIN: 8.9 g/dL — AB (ref 13.0–18.0)
MCH: 29.5 pg (ref 26.0–34.0)
MCHC: 32.9 g/dL (ref 32.0–36.0)
MCV: 89.8 fL (ref 80.0–100.0)
Platelets: 693 10*3/uL — ABNORMAL HIGH (ref 150–440)
RBC: 3.02 MIL/uL — AB (ref 4.40–5.90)
RDW: 14 % (ref 11.5–14.5)
WBC: 32.8 10*3/uL — ABNORMAL HIGH (ref 3.8–10.6)

## 2016-01-08 LAB — BASIC METABOLIC PANEL
Anion gap: 5 (ref 5–15)
BUN: 26 mg/dL — AB (ref 6–20)
CHLORIDE: 100 mmol/L — AB (ref 101–111)
CO2: 37 mmol/L — AB (ref 22–32)
Calcium: 7.6 mg/dL — ABNORMAL LOW (ref 8.9–10.3)
Creatinine, Ser: 0.89 mg/dL (ref 0.61–1.24)
GFR calc Af Amer: >60 mL/min (ref >60)
GFR calc non Af Amer: >60 mL/min (ref >60)
Glucose, Bld: 189 mg/dL — ABNORMAL HIGH (ref 65–99)
POTASSIUM: 4.1 mmol/L (ref 3.5–5.1)
SODIUM: 142 mmol/L (ref 135–145)

## 2016-01-08 LAB — GLUCOSE, CAPILLARY
GLUCOSE-CAPILLARY: 147 mg/dL — AB (ref 65–99)
GLUCOSE-CAPILLARY: 174 mg/dL — AB (ref 65–99)
GLUCOSE-CAPILLARY: 182 mg/dL — AB (ref 65–99)
GLUCOSE-CAPILLARY: 204 mg/dL — AB (ref 65–99)
Glucose-Capillary: 150 mg/dL — ABNORMAL HIGH (ref 65–99)
Glucose-Capillary: 145 mg/dL — ABNORMAL HIGH (ref 65–99)

## 2016-01-08 LAB — DIFFERENTIAL
BASOS ABS: 0.1 10*3/uL (ref 0–0.1)
EOS ABS: 0 10*3/uL (ref 0–0.7)
Eosinophils Relative: 0 %
Lymphocytes Relative: 1 %
Lymphs Abs: 0.4 10*3/uL — ABNORMAL LOW (ref 1.0–3.6)
Monocytes Absolute: 1.3 10*3/uL — ABNORMAL HIGH (ref 0.2–1.0)
NEUTROS ABS: 31 10*3/uL — AB (ref 1.4–6.5)

## 2016-01-08 LAB — BLOOD GAS, ARTERIAL
Acid-Base Excess: 12.8 mmol/L — ABNORMAL HIGH (ref 0.0–3.0)
Allens test (pass/fail): POSITIVE — AB
BICARBONATE: 37.4 meq/L — AB (ref 21.0–28.0)
FIO2: 0.85
O2 Saturation: 93.7 %
PATIENT TEMPERATURE: 37
PEEP: 10 cmH2O
PH ART: 7.5 — AB (ref 7.350–7.450)
RATE: 14 resp/min
pCO2 arterial: 48 mmHg (ref 32.0–48.0)
pO2, Arterial: 63 mmHg — ABNORMAL LOW (ref 83.0–108.0)

## 2016-01-08 MED ORDER — IOHEXOL 300 MG/ML  SOLN
75.0000 mL | Freq: Once | INTRAMUSCULAR | Status: AC | PRN
  Administered 2016-01-08: 75 mL via INTRAVENOUS

## 2016-01-08 MED ORDER — SODIUM CHLORIDE 0.9% FLUSH
10.0000 mL | Freq: Two times a day (BID) | INTRAVENOUS | Status: DC
  Administered 2016-01-08: 30 mL
  Administered 2016-01-08 – 2016-01-09 (×2): 10 mL
  Administered 2016-01-09: 30 mL
  Administered 2016-01-10 – 2016-01-12 (×4): 10 mL
  Administered 2016-01-12: 20 mL
  Administered 2016-01-13 – 2016-01-14 (×3): 10 mL

## 2016-01-08 MED ORDER — FLUCONAZOLE IN SODIUM CHLORIDE 200-0.9 MG/100ML-% IV SOLN
200.0000 mg | Freq: Once | INTRAVENOUS | Status: DC
Start: 2016-01-08 — End: 2016-01-08
  Filled 2016-01-08: qty 100

## 2016-01-08 MED ORDER — FLUCONAZOLE IN SODIUM CHLORIDE 200-0.9 MG/100ML-% IV SOLN
200.0000 mg | Freq: Once | INTRAVENOUS | Status: DC
  Filled 2016-01-08: qty 100

## 2016-01-08 MED ORDER — SODIUM CHLORIDE 0.9% FLUSH
10.0000 mL | INTRAVENOUS | Status: DC | PRN
  Administered 2016-01-09: 10 mL
  Filled 2016-01-08: qty 40

## 2016-01-08 MED ORDER — SENNOSIDES-DOCUSATE SODIUM 8.6-50 MG PO TABS
2.0000 | ORAL_TABLET | Freq: Two times a day (BID) | ORAL | Status: DC
  Administered 2016-01-08 – 2016-01-09 (×3): 2 via ORAL
  Filled 2016-01-08 (×3): qty 2

## 2016-01-08 MED ORDER — FLUCONAZOLE IN SODIUM CHLORIDE 100-0.9 MG/50ML-% IV SOLN: 100.0000 mg | INTRAVENOUS | Status: DC

## 2016-01-08 MED ORDER — METOPROLOL TARTRATE 25 MG PO TABS
25.0000 mg | ORAL_TABLET | Freq: Three times a day (TID) | ORAL | Status: DC
  Administered 2016-01-08 – 2016-01-12 (×11): 25 mg
  Filled 2016-01-08 (×11): qty 1

## 2016-01-08 MED ORDER — METOPROLOL TARTRATE 25 MG PO TABS: 25.0000 mg | ORAL_TABLET | Freq: Two times a day (BID) | ORAL | Status: DC

## 2016-01-08 MED ORDER — POLYETHYLENE GLYCOL 3350 17 G PO PACK
17.0000 g | PACK | Freq: Two times a day (BID) | ORAL | Status: DC
  Administered 2016-01-08 – 2016-01-09 (×3): 17 g via NASOGASTRIC
  Filled 2016-01-08 (×3): qty 1

## 2016-01-08 MED ORDER — VORICONAZOLE 200 MG IV SOLR
4.0000 mg/kg | Freq: Two times a day (BID) | INTRAVENOUS | Status: DC
  Administered 2016-01-09 – 2016-01-11 (×5): 230 mg via INTRAVENOUS
  Filled 2016-01-08 (×8): qty 230

## 2016-01-08 MED ORDER — VORICONAZOLE 200 MG IV SOLR
6.0000 mg/kg | Freq: Two times a day (BID) | INTRAVENOUS | Status: AC
  Administered 2016-01-08 (×2): 350 mg via INTRAVENOUS
  Filled 2016-01-08 (×2): qty 350

## 2016-01-08 NOTE — Progress Notes (Signed)
Patient ID: Larry Pacheco, male   DOB: 1946-07-13, 70 y.o.   MRN: [id number]  [facility] PROGRESS NOTE  Aarron Wierzbicki OZH:[id number] DOB: November 19, 1945 DOA: 01/08/2016 PCP: No primary care provider on file.  HPI/Subjective: Patient intubated and sedated. Patient open eyes to my voice and follow some simple commands  Objective: Filed Vitals:   01/08/16 1300 01/08/16 1400  BP: 135/61 107/55  Pulse: 91 75  Temp:    Resp: 25 17    Filed Weights   01/06/16 0531 01/07/16 0405 01/08/16 0500  Weight: 60 kg (132 lb 4.4 oz) 60.4 kg (133 lb 2.5 oz) 57.9 kg (127 lb 10.3 oz)    ROS: ROS Limited secondary to being on the ventilator Exam: Physical Exam  Constitutional: He is intubated.  HENT:  Nose: No mucosal edema.  Unable to look and mouth  Eyes:  Pupils pinpoint  Neck: Normal carotid pulses present. Carotid bruit is not present.  Cardiovascular: S1 normal and S2 normal.  Tachycardia present.   Pulses:      Dorsalis pedis pulses are 1+ on the right side, and 1+ on the left side.  Respiratory: He is intubated. He has decreased breath sounds in the right lower field. He has no wheezes. He has rhonchi in the right lower field and the left middle field.  GI: Soft. Bowel sounds are normal. There is no tenderness.  Musculoskeletal:       Right ankle: He exhibits no swelling.       Left ankle: He exhibits no swelling.  Neurological:  Patient intubated and sedated  Skin: Skin is warm. No rash noted. Nails show no clubbing.  Psychiatric:  Patient intubated      Data Reviewed: Basic Metabolic Panel:  Recent Labs Lab 01/03/16 0608  01/03/16 2308 01/04/16 0540 01/05/16 0426 01/06/16 0457 01/07/16 0441 01/08/16 0425  NA 139  --   --  138 137 140 142 142  K 3.0*  < >  --  3.8 2.9* 4.0 4.4 4.1  CL 101  --   --  102 100* 104 104 100*  CO2 30  --   --  35* 30 32 35* 37*  GLUCOSE 190*  --   --  190* 249* 141* 190* 189*  BUN 29*  --   --  32* 34* 26* 27* 26*  CREATININE  1.24  --   --  1.08 1.07 0.87 0.91 0.89  CALCIUM 7.1*  --   --  6.7* 7.0* 7.0* 7.3* 7.6*  MG 2.5*  --   --  2.4 2.2 2.1  --   --   PHOS  --   --  1.8* 3.1 2.1* 1.9*  --   --   < > = values in this interval not displayed. Liver Function Tests:  Recent Labs Lab 01/03/16 0608  AST 122*  ALT 101*  ALKPHOS 47  BILITOT 1.2  PROT 5.4*  ALBUMIN 2.1*   CBC:  Recent Labs Lab 01/03/16 0608 01/04/16 0905 01/05/16 0426 01/06/16 0457 01/07/16 0441 01/08/16 0425  WBC 15.2* 10.6 17.1* 22.4* 26.9* 32.8*  NEUTROABS 14.3*  --   --   --   --  31.0*  HGB 10.3* 8.3* 9.7* 9.2* 9.0* 8.9*  HCT 30.8* 24.3* 28.2* 27.4* 27.4* 27.1*  MCV 89.6 88.6 89.0 89.1 90.0 89.8  PLT 288 282 414 517* 588* 693*   CBG:  Recent Labs Lab 01/07/16 1948 01/08/16 0039 01/08/16 0406 01/08/16 0725 01/08/16 1125  GLUCAP 166* 182* 147* 174*  204*    Recent Results (from the past 240 hour(s))  Rapid Influenza A&B Antigens ([facility] only)     Status: None   Collection Time: January 13, 2016  3:01 PM  Result Value Ref Range Status   Influenza A ([facility]) NEGATIVE  Final   Influenza B ([facility]) NEGATIVE  Final  Blood Culture (routine x 2)     Status: None   Collection Time: 01/05/2016  3:55 PM  Result Value Ref Range Status   Specimen Description BLOOD RIGHT ASSIST CONTROL  Final   Special Requests   Final    BOTTLES DRAWN AEROBIC AND ANAEROBIC  6CC AERO, 5CC ANA   Culture  Setup Time   Final    GRAM POSITIVE COCCI ANAEROBIC BOTTLE ONLY CRITICAL RESULT CALLED TO, READ BACK BY AND VERIFIED WITH: Thurman Coyer JAMES 01/02/16 1250PM MLM Organism ID to follow    Culture   Final    STAPHYLOCOCCUS SACCHAROLYTICUS ANAEROBIC BOTTLE ONLY Results consistent with contamination.    Report Status 01/05/2016 FINAL  Final  Blood Culture ID Panel (Reflexed)     Status: None   Collection Time: January 13, 2016  3:55 PM  Result Value Ref Range Status   Enterococcus species NOT DETECTED NOT DETECTED Final   Listeria monocytogenes NOT DETECTED NOT  DETECTED Final   Staphylococcus species NOT DETECTED NOT DETECTED Final   Staphylococcus aureus NOT DETECTED NOT DETECTED Final   Streptococcus species NOT DETECTED NOT DETECTED Final   Streptococcus agalactiae NOT DETECTED NOT DETECTED Final   Streptococcus pneumoniae NOT DETECTED NOT DETECTED Final   Streptococcus pyogenes NOT DETECTED NOT DETECTED Final   Acinetobacter baumannii NOT DETECTED NOT DETECTED Final   Enterobacteriaceae species NOT DETECTED NOT DETECTED Final   Enterobacter cloacae complex NOT DETECTED NOT DETECTED Final   Escherichia coli NOT DETECTED NOT DETECTED Final   Klebsiella oxytoca NOT DETECTED NOT DETECTED Final   Klebsiella pneumoniae NOT DETECTED NOT DETECTED Final   Proteus species NOT DETECTED NOT DETECTED Final   Serratia marcescens NOT DETECTED NOT DETECTED Final   Haemophilus influenzae NOT DETECTED NOT DETECTED Final   Neisseria meningitidis NOT DETECTED NOT DETECTED Final   Pseudomonas aeruginosa NOT DETECTED NOT DETECTED Final   Candida albicans NOT DETECTED NOT DETECTED Final   Candida glabrata NOT DETECTED NOT DETECTED Final   Candida krusei NOT DETECTED NOT DETECTED Final   Candida parapsilosis NOT DETECTED NOT DETECTED Final   Candida tropicalis NOT DETECTED NOT DETECTED Final   Carbapenem resistance NOT DETECTED NOT DETECTED Final   Methicillin resistance NOT DETECTED NOT DETECTED Final   Vancomycin resistance NOT DETECTED NOT DETECTED Final  Blood Culture (routine x 2)     Status: None   Collection Time: 12/27/2015  4:01 PM  Result Value Ref Range Status   Specimen Description BLOOD LEFT ASSIST CONTROL  Final   Special Requests   Final    BOTTLES DRAWN AEROBIC AND ANAEROBIC  3CC AERO, 2CC ANA   Culture NO GROWTH 5 DAYS  Final   Report Status 01/04/2016 FINAL  Final  Urine culture     Status: None   Collection Time: 12/19/2015  6:36 PM  Result Value Ref Range Status   Specimen Description URINE, RANDOM  Final   Special Requests NONE  Final    Culture NO GROWTH 1 DAY  Final   Report Status 01/01/2016 FINAL  Final  MRSA PCR Screening     Status: None   Collection Time: 01/01/16  2:08 AM  Result Value Ref Range Status  MRSA by PCR NEGATIVE NEGATIVE Final    Comment:        The GeneXpert MRSA Assay (FDA approved for NASAL specimens only), is one component of a comprehensive MRSA colonization surveillance program. It is not intended to diagnose MRSA infection nor to guide or monitor treatment for MRSA infections.   Gastrointestinal Panel by PCR , Stool     Status: None   Collection Time: 01/01/16  6:59 AM  Result Value Ref Range Status   Campylobacter species NOT DETECTED NOT DETECTED Final   Plesimonas shigelloides NOT DETECTED NOT DETECTED Final   Salmonella species NOT DETECTED NOT DETECTED Final   Yersinia enterocolitica NOT DETECTED NOT DETECTED Final   Vibrio species NOT DETECTED NOT DETECTED Final   Vibrio cholerae NOT DETECTED NOT DETECTED Final   Enteroaggregative E coli (EAEC) NOT DETECTED NOT DETECTED Final   Enteropathogenic E coli (EPEC) NOT DETECTED NOT DETECTED Final   Enterotoxigenic E coli (ETEC) NOT DETECTED NOT DETECTED Final   Shiga like toxin producing E coli (STEC) NOT DETECTED NOT DETECTED Final   E. coli O157 NOT DETECTED NOT DETECTED Final   Shigella/Enteroinvasive E coli (EIEC) NOT DETECTED NOT DETECTED Final   Cryptosporidium NOT DETECTED NOT DETECTED Final   Cyclospora cayetanensis NOT DETECTED NOT DETECTED Final   Entamoeba histolytica NOT DETECTED NOT DETECTED Final   Giardia lamblia NOT DETECTED NOT DETECTED Final   Adenovirus F40/41 NOT DETECTED NOT DETECTED Final   Astrovirus NOT DETECTED NOT DETECTED Final   Norovirus GI/GII NOT DETECTED NOT DETECTED Final   Rotavirus A NOT DETECTED NOT DETECTED Final   Sapovirus (I, II, IV, and V) NOT DETECTED NOT DETECTED Final  C difficile quick scan w PCR reflex     Status: None   Collection Time: 01/01/16  6:59 AM  Result Value Ref Range Status    C Diff antigen NEGATIVE NEGATIVE Final   C Diff toxin NEGATIVE NEGATIVE Final   C Diff interpretation Negative for C. difficile  Final  Culture, expectorated sputum-assessment     Status: None   Collection Time: 01/01/16 10:46 AM  Result Value Ref Range Status   Specimen Description SPUTUM  Final   Special Requests Normal  Final   Sputum evaluation THIS SPECIMEN IS ACCEPTABLE FOR SPUTUM CULTURE  Final   Report Status 01/01/2016 FINAL  Final  Culture, respiratory (NON-Expectorated)     Status: None   Collection Time: 01/01/16 10:46 AM  Result Value Ref Range Status   Specimen Description SPUTUM  Final   Special Requests Normal Reflexed from [id number]  Final   Gram Stain   Final    FAIR SPECIMEN - 70-80% WBCS FEW SQUAMOUS EPITHELIAL CELLS PRESENT MODERATE WBC SEEN MODERATE YEAST FEW GRAM NEGATIVE RODS RARE GRAM POSITIVE COCCI IN PAIRS    Culture LIGHT GROWTH CANDIDA ALBICANS  Final   Report Status 01/04/2016 FINAL  Final  Fungus Culture with Smear     Status: None (Preliminary result)   Collection Time: 01/02/16  2:52 PM  Result Value Ref Range Status   Specimen Description BRONCHIAL WASHINGS  Final   Special Requests Normal  Final   Culture CANDIDA ALBICANS  Final   Report Status PENDING  Incomplete  Culture, bal-quantitative     Status: None   Collection Time: 01/02/16  2:52 PM  Result Value Ref Range Status   Specimen Description BRONCHIAL WASHINGS  Final   Special Requests Normal  Final   Gram Stain   Final    FAIR SPECIMEN -  70-80% WBCS FEW SQUAMOUS EPITHELIAL CELLS PRESENT MODERATE WBC SEEN MANY YEAST    Culture HEAVY GROWTH CANDIDA ALBICANS  Final   Report Status 01/06/2016 FINAL  Final  Culture, blood (Routine X 2) w Reflex to ID Panel     Status: None (Preliminary result)   Collection Time: 01/06/16 10:42 AM  Result Value Ref Range Status   Specimen Description BLOOD RIGHT ASSIST CONTROL  Final   Special Requests BOTTLES DRAWN AEROBIC AND ANAEROBIC  7CCAERO,7CCANA  Final   Culture NO GROWTH 2 DAYS  Final   Report Status PENDING  Incomplete  Culture, blood (Routine X 2) w Reflex to ID Panel     Status: None (Preliminary result)   Collection Time: 01/06/16 10:51 AM  Result Value Ref Range Status   Specimen Description BLOOD LEFT ASSIST CONTROL  Final   Special Requests BOTTLES DRAWN AEROBIC AND ANAEROBIC 5CCAERO,5CCANA  Final   Culture NO GROWTH 2 DAYS  Final   Report Status PENDING  Incomplete  Culture, respiratory (NON-Expectorated)     Status: None (Preliminary result)   Collection Time: 01/06/16 12:08 PM  Result Value Ref Range Status   Specimen Description TRACHEAL ASPIRATE  Final   Special Requests NONE  Final   Gram Stain   Final    FEW WBC SEEN MANY YEAST GOOD SPECIMEN - 80-90% WBCS    Culture   Final    HEAVY GROWTH YEAST IDENTIFICATION TO FOLLOW [facility] SENDING TO [facility] FOR IDENTIFICATION ONCE ISOLATED    Report Status PENDING  Incomplete     Studies: Dg Chest 1 View  01/08/2016  CLINICAL DATA:  Sepsis, community-acquired pneumonia, respiratory failure with intubation. EXAM: CHEST 1 VIEW COMPARISON:  Portable chest x-ray of January 07, 2016 FINDINGS: The lungs are adequately inflated. Confluent interstitial and alveolar opacities persist bilaterally. There is no large pleural effusion. There is no pneumothorax. The heart is normal in size. The pulmonary vascularity is indistinct. The endotracheal tube tip lies 4.8 cm above the carina. The esophagogastric tube tip projects below the inferior margin of the image. The right internal jugular venous catheter tip projects over the midportion of the SVC. IMPRESSION: Stable interstitial and alveolar opacities consistent with pneumonia, less likely pulmonary edema. The support tubes are in reasonable position. Electronically Signed   By: David  [place] M.D.   On: 01/08/2016 07:14   Dg Chest Port 1 View  01/07/2016  CLINICAL DATA:  Respiratory failure. EXAM: PORTABLE CHEST 1 VIEW  COMPARISON:  01/06/2016 . FINDINGS: Endotracheal tube and NG tube in stable position. Right IJ line stable position. Mild cardiomegaly. Progressive bilateral pulmonary alveolar infiltrates. Findings could be secondary to pulmonary edema. Progressive bilateral pneumonia could also present this fashion. No pleural effusion or pneumothorax. IMPRESSION: 1. Lines and tubes in stable position. 2. Mild cardiomegaly. Progressive bilateral pulmonary alveolar infiltrates. These findings could be secondary to pulmonary edema. Bilateral pneumonia could also present in this fashion. Electronically Signed   By: Maisie Fus  Register   On: 01/07/2016 07:26    Scheduled Meds: . antiseptic oral rinse  7 mL Mouth Rinse QID  . chlorhexidine gluconate  15 mL Mouth Rinse BID  . diltiazem  30 mg Oral 4 times per day  . famotidine  20 mg Per Tube BID  . feeding supplement (PRO-STAT SUGAR FREE 64)  30 mL Per Tube Daily  . free water  200 mL Per Tube 3 times per day  . heparin subcutaneous  5,000 Units Subcutaneous Q12H  . insulin aspart  0-15 Units Subcutaneous 6 times per day  . insulin glargine  10 Units Subcutaneous Daily  . methylPREDNISolone (SOLU-MEDROL) injection  40 mg Intravenous Q12H  . metoprolol tartrate  25 mg Per Tube Q8H  . polyethylene glycol  17 g Per NG tube BID  . senna-docusate  2 tablet Oral BID  . sodium chloride flush  10-40 mL Intracatheter Q12H  . voriconazole  6 mg/kg Intravenous Q12H   Followed by  . [START ON 01/09/2016] voriconazole  4 mg/kg Intravenous Q12H   Continuous Infusions: . sodium chloride 10 mL/hr at 01/08/16 1000  . feeding supplement (VITAL AF 1.2 CAL) 1,000 mL (01/08/16 1006)  . fentaNYL infusion INTRAVENOUS 300 mcg/hr (01/08/16 1230)  . propofol (DIPRIVAN) infusion 10 mcg/kg/min (01/08/16 1454)    Assessment/Plan:  1. Clinical sepsis with pneumonia bilaterally increasing leukocytosis. Patient finished Zosyn and Zithromax course. Blood culture drawn on admission is a Skin  contamination. Repeat cultures have been negative. Sputum culture from bronchial culture only grew out yeast. Patient still having fever. White blood cell count going up. Patient placed on voriconazole. 2. Septic shock- levophed has been stopped. 3. Acute respiratory failure with hypoxia. Secondary to rapidly progressing pneumonia. Bronchoscopy showing yeast. Patient on 85% FiO2. anca negative.  CT chest ordered by critical care specialist 4. GI bleed with drop in hemoglobin. Today's hemoglobin is higher at 8.9. Patient is on Protonix. 5. Nutrition - continue tube feeds.  6. Essential hypertension, tachycardia- patient started on metoprolol and Cardizem. Blood pressure and heart rate very labile 7. Hypokalemia, hypophosphatemia- kphos ordered  Code Status:     Code Status Orders        Start     Ordered   Jan 04, 2016 1823  Full code   Continuous     January 04, 2016 1823    Code Status History    Date Active Date Inactive Code Status Order ID Comments User Context   This patient has a current code status but no historical code status.    Advance Directive Documentation        Most Recent Value   Type of Advance Directive  Living will   Pre-existing out of facility DNR order (yellow form or pink MOST form)     "MOST" Form in Place?       Family Communication: Spoke with sister on the phone. Disposition Plan: To be determined  Consultants:  Critical care specialist  Gastroenterologist  Infectious disease consultation  Procedures:  Intubation  bronchoscopy  Antibiotics:  Zosyn   Zithromax (finished)  Time spent: 31 minutes critical care time  Alford Highland  [facility]

## 2016-01-08 NOTE — Progress Notes (Signed)
Pharmacy Antibiotic Note  Larry Pacheco is a 70 y.o. male admitted on 12/20/2015 with pneumonia and sepsis.  Pharmacy consulted for Voriconazole dosing.   Plan:  Will start voriconazole  IV Q12hr x 2 doses. Will continue patient on voriconazole  IV Q12hr starting on 2/23.  Per conversation with MD, will discontinue Zosyn.   Height:  (167.6 cm) Weight: 127 lb 10.3 oz (57.9 kg) IBW/kg (Calculated) : 63.8  Temp (24hrs), Avg:98.9 F (37.2 C), Min:97.9 F (36.6 C), Max:99.5 F (37.5 C)   Recent Labs Lab 01/04/16 0540 01/04/16 0905 01/05/16 0426 01/06/16 0457 01/07/16 0441 01/08/16 0425  WBC  --  10.6 17.1* 22.4* 26.9* 32.8*  CREATININE 1.08  --  1.07 0.87 0.91 0.89    Estimated Creatinine Clearance: 62.3 mL/min (by C-G formula based on Cr of 0.89).    No Known Allergies   Antibiotics:  Ceftriaxone 2/13 >>2/14 Azithromycin 2/13 >>2/14, 2/16>>  Vanc 2/15>>2/15 Zosyn 2/15>>2/22 Voriconazole 2/22  Microbiology:  02/13 Flu: negative 02/15 MRSA PCR: negative 02/13 Urine cx: negative 02/15 GI panel: negative 02/15 C diff: negative 02/15 Sputum cx: pending 02/15 GPC in 1/4 bottles 02/16 Bronch Wash: Heavy Growth Candida Albicans  02/20 Respiratory Culture: Heavy Growth Yeast/Mold sent  Pharmacy will continue to monitor and adjust per consult.  Elainna Eshleman L 01/08/2016 2:41 PM

## 2016-01-08 NOTE — Progress Notes (Signed)
PULMONARY / CRITICAL CARE MEDICINE   Name: Larry Pacheco MRN: [id number] DOB: August 26, 1946    ADMISSION DATE:  20-Jan-2016  BRIEF HISTORY: 70 year old male with no stomach and past medical history, admitted on 12/30/2011 for shortness of breath, and intermittent hemoptysis, started experiencing shortness of hemoptysis along with cough about one week ago after going out to the [place] to bird watch. Nonsmoker, states he is in good health otherwise. CTA chest negative for pulmonary embolus. Acute decline in respiratory status from nasal cannula to high flow oxygen to requiring intubation.  SUBJECTIVE:  No acute issues overnight. Still requiring more than 80% FiO2 and an high doses of sedation. Tolerating pressure support ventilation. Hypotensive   VITAL SIGNS: Temp:  [98.7 F (37.1 C)-99.7 F (37.6 C)] 98.7 F (37.1 C) (02/22 0800) Pulse Rate:  [70-136] 102 (02/22 0800) Resp:  [12-30] 21 (02/22 0800) BP: (85-179)/(56-111) 158/80 mmHg (02/22 0800) SpO2:  [87 %-100 %] 92 % (02/22 0800) FiO2 (%):  [85 %-100 %] 85 % (02/22 0800) Weight:  [127 lb 10.3 oz (57.9 kg)] 127 lb 10.3 oz (57.9 kg) (02/22 0500) HEMODYNAMICS:   VENTILATOR SETTINGS: Vent Mode:  [-]  FiO2 (%):  [85 %-100 %] 85 % Set Rate:  [14 bmp] 14 bmp PEEP:  [10 cmH20] 10 cmH20 INTAKE / OUTPUT:  Intake/Output Summary (Last 24 hours) at 01/08/16 4098 Last data filed at 01/08/16 0800  Gross per 24 hour  Intake 2413.89 ml  Output   5725 ml  Net -3311.11 ml    Review of Systems  Unable to perform ROS: intubated    Physical Exam  Constitutional: He is well-developed, well-nourished, and in no distress.  HENT:  Head: Normocephalic.  Mouth/Throat: Oropharynx is clear and moist.  Eyes: Pupils are equal, round, and reactive to light.  Neck: Neck supple. No JVD present. No thyromegaly present.  Cardiovascular: Regular rhythm, normal heart sounds and intact distal pulses.   No murmur heard. Pulmonary/Chest: No respiratory  distress. He has no wheezes. He has no rales.  No distress on the vent.  No wheezes Breath sounds diminished in the bases  Abdominal: Soft. He exhibits distension. There is no tenderness. There is no rebound.  Mild distention but soft, hypoactive bowel sounds  Genitourinary: Penis normal.  Musculoskeletal: He exhibits no edema.  Neurological: He displays normal reflexes. No cranial nerve deficit.  Sedated on the vent. Withdraws to noxious stimulus  Skin: Skin is warm. He is diaphoretic.  Nursing note and vitals reviewed.    LABS:  CBC  Recent Labs Lab 01/06/16 0457 01/07/16 0441 01/08/16 0425  WBC 22.4* 26.9* 32.8*  HGB 9.2* 9.0* 8.9*  HCT 27.4* 27.4* 27.1*  PLT 517* 588* 693*   Coag's No results for input(s): APTT, INR in the last 168 hours. BMET  Recent Labs Lab 01/06/16 0457 01/07/16 0441 01/08/16 0425  NA 140 142 142  K 4.0 4.4 4.1  CL 104 104 100*  CO2 32 35* 37*  BUN 26* 27* 26*  CREATININE 0.87 0.91 0.89  GLUCOSE 141* 190* 189*   Electrolytes  Recent Labs Lab 01/04/16 0540 01/05/16 0426 01/06/16 0457 01/07/16 0441 01/08/16 0425  CALCIUM 6.7* 7.0* 7.0* 7.3* 7.6*  MG 2.4 2.2 2.1  --   --   PHOS 3.1 2.1* 1.9*  --   --    Sepsis Markers No results for input(s): LATICACIDVEN, PROCALCITON, O2SATVEN in the last 168 hours. ABG  Recent Labs Lab 01/05/16 0450 01/06/16 0430 01/08/16 0446  PHART 7.44 7.49* 7.50*  PCO2ART 46 43 48  PO2ART 50* 56* 63*   Liver Enzymes  Recent Labs Lab 01/03/16 0608  AST 122*  ALT 101*  ALKPHOS 47  BILITOT 1.2  ALBUMIN 2.1*   Cardiac Enzymes No results for input(s): TROPONINI, PROBNP in the last 168 hours. Glucose  Recent Labs Lab 01/07/16 1135 01/07/16 1602 01/07/16 1948 01/08/16 0039 01/08/16 0406 01/08/16 0725  GLUCAP 170* 185* 166* 182* 147* 174*    Dg Chest 1 View  01/08/2016  CLINICAL DATA:  Sepsis, community-acquired pneumonia, respiratory failure with intubation. EXAM: CHEST 1 VIEW  COMPARISON:  Portable chest x-ray of January 07, 2016 FINDINGS: The lungs are adequately inflated. Confluent interstitial and alveolar opacities persist bilaterally. There is no large pleural effusion. There is no pneumothorax. The heart is normal in size. The pulmonary vascularity is indistinct. The endotracheal tube tip lies 4.8 cm above the carina. The esophagogastric tube tip projects below the inferior margin of the image. The right internal jugular venous catheter tip projects over the midportion of the SVC. IMPRESSION: Stable interstitial and alveolar opacities consistent with pneumonia, less likely pulmonary edema. The support tubes are in reasonable position. Electronically Signed   By: David  [place] M.D.   On: 01/08/2016 07:14   STUDIES:  CTA chest 2/12>> no PE, multilobar basilar pneumonia, mildly tight findings, pneumonitis  SIGNIFICANT EVENTS: 2/16>> worsening hypoxia requiring high flow nasal cannula, unable to wean, subsequently intubated  Micro: MRSA PCR 02/15 >>NEG Rapid flu 02/13 >> NEG Blood 2/13 >> 1/2 staph saccharolyticus (likely contaminant) Urine 02/13 >> NEG C diff 02/15 >> NEG Resp 02/15 >> light growth candida BAL 2/16 >> heavy growth yeast (candida albicans) HIV 02/20 >> NEG Resp 02/20 >>  Pneumocystis DFA 02/20 >>  Legionella DFA 02/20 >>    Antibiotics: Azithro 02/13 >> 02/19 Ceftriaxone 02/13 >> 02/15 Vanc 2/15 >> 02/18 Zosyn 2/15 >>02/22  Fluconazole 02/22>  Lines: R IJ CVL 02/16 >>  ETT 02/15 >>    ASSESSMENT / PLAN: 70 year old male no significant past medical history now with acute respiratory failure/ARDS, multifocal pneumonia of unclear etiology, cough, dyspnea, accidental fall, requiring supplemental oxygen, chest CT concerning for interstitial lung disease versus multifocal pneumonia/hypersensivity pneumonitis; with minimal improvement. Still requiring now requiring high FiO2 and high doses of sedation   A: PULMONARY VDRF Bilateral  pulmonary infiltrates - favor pneumonitis or ARDS or pulmonary edema-Unable to wean due poor lung aeration on CXR despite treatment.  Hemoptysis-resolved P:  Cont vent support - settings reviewed and/or adjusted Better synchrony in PCV mode Use ARDS PEEP/FiO2 algorithm Cont vent bundle Daily SBT if/when meets criteria Daily CXR Bronchodilators IV steroids Repeat CT chest with IV contrast today, due to continued refractory hypoxemia.  A: CARDIOVASCULAR Minimally elevated troponin - likely demand ischemia Hypertension Sinus tachycardia P:  Change metoprolol to 25 mg q8h and PRN IV dose to maintain SBP<160 MAP goal > 65 mmHg  A: RENAL No issues P: Monitor BMET intermittently Monitor I/Os Correct electrolytes as indicated  A: GASTROINTESTINAL A: Melana-Resolved Ileus  P: SUP: IV famotidine Resume tube feeds.  Monitor NGT output   A: HEMATOLOGIC Acute blood loss anemia - no active bleeding evident presently P: DVT px: SCDs Monitor CBC intermittently Transfuse per usual guidelines  INFECTIOUS A: Pneumonitis - if infectious, suspect atypical P: Monitor temp, WBC count F/u cultures Checked HIV 02/20 >> NEG D/C pip-tazo per ID Check PCT level Started on fluconazole 02/22 for yeast in sputum  A: ENDOCRINE A: Steroid induced hyperglycemia-no prior dx  of DM P: Continue SSI - mod scale Continue Lantus 02/20   A: NEUROLOGIC ICU/vent associated discomfort P:  RASS goal: -1, -2 PAD protocol - fent/propofol   Best Practice: Code Status:  Full. Diet: NPO GI prophylaxis:  PPI. VTE prophylaxis:  SCD's / heparin.  Critical care time spent examining patient, establishing treatment plan, managing vent, reviewing history and labs, CXR, and ABG interpretation is 45 minutes  Magdalene S. Tukov ANP-BC Pulmonary and Critical Care Medicine [facility] Pager [phone]  Patient seen and examined with the nurse practitioner, I agree with their  assessment and plan formulated the above assessment and plan.  Critical Care Attestation.  I have personally obtained a history, examined the patient, evaluated laboratory and imaging results, formulated the assessment and plan and placed orders. The Patient requires high complexity decision making for assessment and support, frequent evaluation and titration of therapies, application of advanced monitoring technologies and extensive interpretation of multiple databases. The patient has critical illness that could lead imminently to failure of 1 or more organ systems and requires the highest level of physician preparedness to intervene.  Critical Care Time devoted to patient care services described in this note is 35 minutes and is exclusive of time spent in procedures.

## 2016-01-08 NOTE — Progress Notes (Signed)
ID E note Reviewed chart, labs. Patient has severe respiratory process for over a week now with all cultures negative except for Candida. HIV negative. One sputum now with mold being further isolated Has been treated with high doses of methyl prednisolone likely accounting for the increasing WBC. Has had low grade temps.  Received broad spectrum antibiotics from 2/13- present. Today is Day 10 of antibiotics  Rec  Dc abx if CCM agress Start voriconazole in place of fluconazole

## 2016-01-08 NOTE — Therapy (Signed)
Patient transported to and from CT via portable vent at ordered settings without incident. Back on Servo i in room.

## 2016-01-09 ENCOUNTER — Inpatient Hospital Stay: Payer: Medicare Other

## 2016-01-09 LAB — BASIC METABOLIC PANEL
Anion gap: 6 (ref 5–15)
BUN: 30 mg/dL — ABNORMAL HIGH (ref 6–20)
CHLORIDE: 104 mmol/L (ref 101–111)
CO2: 34 mmol/L — ABNORMAL HIGH (ref 22–32)
CREATININE: 0.89 mg/dL (ref 0.61–1.24)
Calcium: 7.7 mg/dL — ABNORMAL LOW (ref 8.9–10.3)
GFR calc non Af Amer: >60 mL/min (ref >60)
Glucose, Bld: 163 mg/dL — ABNORMAL HIGH (ref 65–99)
Potassium: 4 mmol/L (ref 3.5–5.1)
SODIUM: 144 mmol/L (ref 135–145)

## 2016-01-09 LAB — CBC
HCT: 24.8 % — ABNORMAL LOW (ref 40.0–52.0)
HEMOGLOBIN: 8.4 g/dL — AB (ref 13.0–18.0)
MCH: 29.6 pg (ref 26.0–34.0)
MCHC: 33.7 g/dL (ref 32.0–36.0)
MCV: 87.9 fL (ref 80.0–100.0)
Platelets: 710 10*3/uL — ABNORMAL HIGH (ref 150–440)
RBC: 2.82 MIL/uL — ABNORMAL LOW (ref 4.40–5.90)
RDW: 14.2 % (ref 11.5–14.5)
WBC: 32.7 10*3/uL — ABNORMAL HIGH (ref 3.8–10.6)

## 2016-01-09 LAB — EXPECTORATED SPUTUM ASSESSMENT W REFEX TO RESP CULTURE

## 2016-01-09 LAB — GLUCOSE, CAPILLARY
GLUCOSE-CAPILLARY: 143 mg/dL — AB (ref 65–99)
GLUCOSE-CAPILLARY: 147 mg/dL — AB (ref 65–99)
GLUCOSE-CAPILLARY: 179 mg/dL — AB (ref 65–99)
Glucose-Capillary: 171 mg/dL — ABNORMAL HIGH (ref 65–99)
Glucose-Capillary: 188 mg/dL — ABNORMAL HIGH (ref 65–99)

## 2016-01-09 LAB — EXPECTORATED SPUTUM ASSESSMENT W GRAM STAIN, RFLX TO RESP C: Special Requests: NORMAL

## 2016-01-09 LAB — PROCALCITONIN: PROCALCITONIN: 0.57 ng/mL

## 2016-01-09 LAB — MISC LABCORP TEST (SEND OUT): Labcorp test code: 9985

## 2016-01-09 LAB — MAGNESIUM: MAGNESIUM: 2 mg/dL (ref 1.7–2.4)

## 2016-01-09 LAB — PHOSPHORUS: PHOSPHORUS: 2.3 mg/dL — AB (ref 2.5–4.6)

## 2016-01-09 MED ORDER — DOXYCYCLINE HYCLATE 100 MG IV SOLR
100.0000 mg | Freq: Two times a day (BID) | INTRAVENOUS | Status: DC
  Administered 2016-01-09 – 2016-01-11 (×5): 100 mg via INTRAVENOUS
  Filled 2016-01-09 (×6): qty 100

## 2016-01-09 MED ORDER — METHYLPREDNISOLONE SODIUM SUCC 40 MG IJ SOLR
40.0000 mg | INTRAMUSCULAR | Status: DC
  Administered 2016-01-10 – 2016-01-11 (×2): 40 mg via INTRAVENOUS
  Filled 2016-01-09 (×2): qty 1

## 2016-01-09 MED ORDER — DOXYCYCLINE HYCLATE 100 MG PO TABS: 100.0000 mg | ORAL_TABLET | Freq: Two times a day (BID) | ORAL | Status: DC

## 2016-01-09 NOTE — Plan of Care (Signed)
Problem: ICU Phase Progression Outcomes Goal: O2 sats trending toward baseline Outcome: Progressing FiO2 lowered to 75% (Peep still at 10). Goal: Dyspnea controlled at rest Outcome: Progressing With ventilator support Goal: Hemodynamically stable Outcome: Completed/Met Date Met:  01/09/16 Blood pressure and heart rate controlled with medications through OG tube.

## 2016-01-09 NOTE — Progress Notes (Signed)
Patient ID: Phillips Goulette, male   DOB: 08/18/1946, 70 y.o.   MRN: [id number] Patient ID: Davi Kroon, male   DOB: June 02, 1946, 70 y.o.   MRN: [id number]  [facility] PROGRESS NOTE  Stacy Deshler NWG:[id number] DOB: 1946/10/14 DOA: 01/14/2016 PCP: No primary care provider on file.  HPI/Subjective: Patient intubated and sedated. Patient open eyes to my voice and follow some simple commands  Objective: Filed Vitals:   01/09/16 1300 01/09/16 1400  BP: 138/75 136/85  Pulse: 95 99  Temp:    Resp: 21 24    Filed Weights   01/07/16 0405 01/08/16 0500 01/09/16 0500  Weight: 60.4 kg (133 lb 2.5 oz) 57.9 kg (127 lb 10.3 oz) 57.5 kg (126 lb 12.2 oz)    ROS: ROS Limited secondary to being on the ventilator Exam: Physical Exam  Constitutional: He is intubated.  HENT:  Nose: No mucosal edema.  Unable to look and mouth  Eyes:  Pupils pinpoint  Neck: Normal carotid pulses present. Carotid bruit is not present.  Cardiovascular: S1 normal and S2 normal.  Tachycardia present.   Pulses:      Dorsalis pedis pulses are 1+ on the right side, and 1+ on the left side.  Respiratory: He is intubated. He has decreased breath sounds in the right lower field. He has no wheezes. He has rhonchi in the right lower field and the left middle field.  GI: Soft. Bowel sounds are normal. There is no tenderness.  Musculoskeletal:       Right ankle: He exhibits no swelling.       Left ankle: He exhibits no swelling.  Neurological:  Patient intubated and sedated  Skin: Skin is warm. No rash noted. Nails show no clubbing.  Psychiatric:  Patient intubated      Data Reviewed: Basic Metabolic Panel:  Recent Labs Lab 01/03/16 0608  01/03/16 2308 01/04/16 0540 01/05/16 0426 01/06/16 0457 01/07/16 0441 01/08/16 0425 01/09/16 0517  NA 139  --   --  138 137 140 142 142 144  K 3.0*  < >  --  3.8 2.9* 4.0 4.4 4.1 4.0  CL 101  --   --  102 100* 104 104 100* 104  CO2 30  --   --  35* 30 32 35*  37* 34*  GLUCOSE 190*  --   --  190* 249* 141* 190* 189* 163*  BUN 29*  --   --  32* 34* 26* 27* 26* 30*  CREATININE 1.24  --   --  1.08 1.07 0.87 0.91 0.89 0.89  CALCIUM 7.1*  --   --  6.7* 7.0* 7.0* 7.3* 7.6* 7.7*  MG 2.5*  --   --  2.4 2.2 2.1  --   --  2.0  PHOS  --   --  1.8* 3.1 2.1* 1.9*  --   --  2.3*  < > = values in this interval not displayed. Liver Function Tests:  Recent Labs Lab 01/03/16 0608  AST 122*  ALT 101*  ALKPHOS 47  BILITOT 1.2  PROT 5.4*  ALBUMIN 2.1*   CBC:  Recent Labs Lab 01/03/16 0608  01/05/16 0426 01/06/16 0457 01/07/16 0441 01/08/16 0425 01/09/16 0517  WBC 15.2*  < > 17.1* 22.4* 26.9* 32.8* 32.7*  NEUTROABS 14.3*  --   --   --   --  31.0*  --   HGB 10.3*  < > 9.7* 9.2* 9.0* 8.9* 8.4*  HCT 30.8*  < > 28.2* 27.4* 27.4* 27.1*  24.8*  MCV 89.6  < > 89.0 89.1 90.0 89.8 87.9  PLT 288  < > 414 517* 588* 693* 710*  < > = values in this interval not displayed. CBG:  Recent Labs Lab 01/08/16 2014 01/09/16 0103 01/09/16 0401 01/09/16 0823 01/09/16 1202  GLUCAP 145* 143* 147* 171* 188*    Recent Results (from the past 240 hour(s))  Blood Culture (routine x 2)     Status: None   Collection Time: 01/14/2016  3:55 PM  Result Value Ref Range Status   Specimen Description BLOOD RIGHT ASSIST CONTROL  Final   Special Requests   Final    BOTTLES DRAWN AEROBIC AND ANAEROBIC  6CC AERO, 5CC ANA   Culture  Setup Time   Final    GRAM POSITIVE COCCI ANAEROBIC BOTTLE ONLY CRITICAL RESULT CALLED TO, READ BACK BY AND VERIFIED WITH: Ihor Austin 01/02/16 1250PM MLM Organism ID to follow    Culture   Final    STAPHYLOCOCCUS SACCHAROLYTICUS ANAEROBIC BOTTLE ONLY Results consistent with contamination.    Report Status 01/05/2016 FINAL  Final  Blood Culture ID Panel (Reflexed)     Status: None   Collection Time: 12/18/2015  3:55 PM  Result Value Ref Range Status   Enterococcus species NOT DETECTED NOT DETECTED Final   Listeria monocytogenes NOT DETECTED  NOT DETECTED Final   Staphylococcus species NOT DETECTED NOT DETECTED Final   Staphylococcus aureus NOT DETECTED NOT DETECTED Final   Streptococcus species NOT DETECTED NOT DETECTED Final   Streptococcus agalactiae NOT DETECTED NOT DETECTED Final   Streptococcus pneumoniae NOT DETECTED NOT DETECTED Final   Streptococcus pyogenes NOT DETECTED NOT DETECTED Final   Acinetobacter baumannii NOT DETECTED NOT DETECTED Final   Enterobacteriaceae species NOT DETECTED NOT DETECTED Final   Enterobacter cloacae complex NOT DETECTED NOT DETECTED Final   Escherichia coli NOT DETECTED NOT DETECTED Final   Klebsiella oxytoca NOT DETECTED NOT DETECTED Final   Klebsiella pneumoniae NOT DETECTED NOT DETECTED Final   Proteus species NOT DETECTED NOT DETECTED Final   Serratia marcescens NOT DETECTED NOT DETECTED Final   Haemophilus influenzae NOT DETECTED NOT DETECTED Final   Neisseria meningitidis NOT DETECTED NOT DETECTED Final   Pseudomonas aeruginosa NOT DETECTED NOT DETECTED Final   Candida albicans NOT DETECTED NOT DETECTED Final   Candida glabrata NOT DETECTED NOT DETECTED Final   Candida krusei NOT DETECTED NOT DETECTED Final   Candida parapsilosis NOT DETECTED NOT DETECTED Final   Candida tropicalis NOT DETECTED NOT DETECTED Final   Carbapenem resistance NOT DETECTED NOT DETECTED Final   Methicillin resistance NOT DETECTED NOT DETECTED Final   Vancomycin resistance NOT DETECTED NOT DETECTED Final  Blood Culture (routine x 2)     Status: None   Collection Time: 01/02/2016  4:01 PM  Result Value Ref Range Status   Specimen Description BLOOD LEFT ASSIST CONTROL  Final   Special Requests   Final    BOTTLES DRAWN AEROBIC AND ANAEROBIC  3CC AERO, 2CC ANA   Culture NO GROWTH 5 DAYS  Final   Report Status 01/04/2016 FINAL  Final  Urine culture     Status: None   Collection Time: 01/09/2016  6:36 PM  Result Value Ref Range Status   Specimen Description URINE, RANDOM  Final   Special Requests NONE  Final    Culture NO GROWTH 1 DAY  Final   Report Status 01/01/2016 FINAL  Final  MRSA PCR Screening     Status: None   Collection Time:  01/01/16  2:08 AM  Result Value Ref Range Status   MRSA by PCR NEGATIVE NEGATIVE Final    Comment:        The GeneXpert MRSA Assay (FDA approved for NASAL specimens only), is one component of a comprehensive MRSA colonization surveillance program. It is not intended to diagnose MRSA infection nor to guide or monitor treatment for MRSA infections.   Gastrointestinal Panel by PCR , Stool     Status: None   Collection Time: 01/01/16  6:59 AM  Result Value Ref Range Status   Campylobacter species NOT DETECTED NOT DETECTED Final   Plesimonas shigelloides NOT DETECTED NOT DETECTED Final   Salmonella species NOT DETECTED NOT DETECTED Final   Yersinia enterocolitica NOT DETECTED NOT DETECTED Final   Vibrio species NOT DETECTED NOT DETECTED Final   Vibrio cholerae NOT DETECTED NOT DETECTED Final   Enteroaggregative E coli (EAEC) NOT DETECTED NOT DETECTED Final   Enteropathogenic E coli (EPEC) NOT DETECTED NOT DETECTED Final   Enterotoxigenic E coli (ETEC) NOT DETECTED NOT DETECTED Final   Shiga like toxin producing E coli (STEC) NOT DETECTED NOT DETECTED Final   E. coli O157 NOT DETECTED NOT DETECTED Final   Shigella/Enteroinvasive E coli (EIEC) NOT DETECTED NOT DETECTED Final   Cryptosporidium NOT DETECTED NOT DETECTED Final   Cyclospora cayetanensis NOT DETECTED NOT DETECTED Final   Entamoeba histolytica NOT DETECTED NOT DETECTED Final   Giardia lamblia NOT DETECTED NOT DETECTED Final   Adenovirus F40/41 NOT DETECTED NOT DETECTED Final   Astrovirus NOT DETECTED NOT DETECTED Final   Norovirus GI/GII NOT DETECTED NOT DETECTED Final   Rotavirus A NOT DETECTED NOT DETECTED Final   Sapovirus (I, II, IV, and V) NOT DETECTED NOT DETECTED Final  C difficile quick scan w PCR reflex     Status: None   Collection Time: 01/01/16  6:59 AM  Result Value Ref Range  Status   C Diff antigen NEGATIVE NEGATIVE Final   C Diff toxin NEGATIVE NEGATIVE Final   C Diff interpretation Negative for C. difficile  Final  Culture, expectorated sputum-assessment     Status: None   Collection Time: 01/01/16 10:46 AM  Result Value Ref Range Status   Specimen Description SPUTUM  Final   Special Requests Normal  Final   Sputum evaluation THIS SPECIMEN IS ACCEPTABLE FOR SPUTUM CULTURE  Final   Report Status 01/01/2016 FINAL  Final  Culture, respiratory (NON-Expectorated)     Status: None   Collection Time: 01/01/16 10:46 AM  Result Value Ref Range Status   Specimen Description SPUTUM  Final   Special Requests Normal Reflexed from [id number]  Final   Gram Stain   Final    FAIR SPECIMEN - 70-80% WBCS FEW SQUAMOUS EPITHELIAL CELLS PRESENT MODERATE WBC SEEN MODERATE YEAST FEW GRAM NEGATIVE RODS RARE GRAM POSITIVE COCCI IN PAIRS    Culture LIGHT GROWTH CANDIDA ALBICANS  Final   Report Status 01/04/2016 FINAL  Final  Fungus Culture with Smear     Status: None (Preliminary result)   Collection Time: 01/02/16  2:52 PM  Result Value Ref Range Status   Specimen Description BRONCHIAL WASHINGS  Final   Special Requests Normal  Final   Culture CANDIDA ALBICANS  Final   Report Status PENDING  Incomplete  Culture, bal-quantitative     Status: None   Collection Time: 01/02/16  2:52 PM  Result Value Ref Range Status   Specimen Description BRONCHIAL WASHINGS  Final   Special Requests Normal  Final  Gram Stain   Final    FAIR SPECIMEN - 70-80% WBCS FEW SQUAMOUS EPITHELIAL CELLS PRESENT MODERATE WBC SEEN MANY YEAST    Culture HEAVY GROWTH CANDIDA ALBICANS  Final   Report Status 01/06/2016 FINAL  Final  Culture, blood (Routine X 2) w Reflex to ID Panel     Status: None (Preliminary result)   Collection Time: 01/06/16 10:42 AM  Result Value Ref Range Status   Specimen Description BLOOD RIGHT ASSIST CONTROL  Final   Special Requests BOTTLES DRAWN AEROBIC AND ANAEROBIC  7CCAERO,7CCANA  Final   Culture NO GROWTH 3 DAYS  Final   Report Status PENDING  Incomplete  Culture, blood (Routine X 2) w Reflex to ID Panel     Status: None (Preliminary result)   Collection Time: 01/06/16 10:51 AM  Result Value Ref Range Status   Specimen Description BLOOD LEFT ASSIST CONTROL  Final   Special Requests BOTTLES DRAWN AEROBIC AND ANAEROBIC 5CCAERO,5CCANA  Final   Culture NO GROWTH 3 DAYS  Final   Report Status PENDING  Incomplete  Culture, respiratory (NON-Expectorated)     Status: None (Preliminary result)   Collection Time: 01/06/16 12:08 PM  Result Value Ref Range Status   Specimen Description TRACHEAL ASPIRATE  Final   Special Requests NONE  Final   Gram Stain   Final    FEW WBC SEEN MANY YEAST GOOD SPECIMEN - 80-90% WBCS    Culture   Final    HEAVY GROWTH CANDIDA ALBICANS MOLD SENDING TO [facility] FOR IDENTIFICATION ONCE ISOLATED    Report Status PENDING  Incomplete     Studies: Dg Chest 1 View  01/08/2016  CLINICAL DATA:  Sepsis, community-acquired pneumonia, respiratory failure with intubation. EXAM: CHEST 1 VIEW COMPARISON:  Portable chest x-ray of January 07, 2016 FINDINGS: The lungs are adequately inflated. Confluent interstitial and alveolar opacities persist bilaterally. There is no large pleural effusion. There is no pneumothorax. The heart is normal in size. The pulmonary vascularity is indistinct. The endotracheal tube tip lies 4.8 cm above the carina. The esophagogastric tube tip projects below the inferior margin of the image. The right internal jugular venous catheter tip projects over the midportion of the SVC. IMPRESSION: Stable interstitial and alveolar opacities consistent with pneumonia, less likely pulmonary edema. The support tubes are in reasonable position. Electronically Signed   By: David  [place] M.D.   On: 01/08/2016 07:14   Ct Chest W Contrast  01/08/2016  CLINICAL DATA:  Hemoptysis. Shortness breath. Cough. Acute respiratory failure.  Community acquired pneumonia and sepsis. EXAM: CT CHEST WITH CONTRAST TECHNIQUE: Multidetector CT imaging of the chest was performed during intravenous contrast administration. CONTRAST:  75mL OMNIPAQUE IOHEXOL 300 MG/ML  SOLN COMPARISON:  None. FINDINGS: Mediastinum/Lymph Nodes: Small mediastinal lymph nodes are seen in the AP window, largest measuring 12 mm on image 23. No other pathologically enlarged lymph nodes identified. Endotracheal tube and nasogastric tube seen in place. Lungs/Pleura: Diffuse bilateral pulmonary airspace disease is seen which may be due to diffuse pulmonary edema, ARDS, or pneumonia. Small pleural effusions seen bilaterally. No definite pulmonary mass identified. Upper abdomen: No acute findings. Musculoskeletal: No chest wall mass or suspicious bone lesions identified. IMPRESSION: Diffuse bilateral pulmonary airspace disease and small bilateral pleural effusions. Differential diagnosis includes diffuse pulmonary edema, ARDS, or pneumonia. Mild mediastinal lymphadenopathy in the AP window, likely reactive in etiology. Consider continued attention on follow-up CT. Electronically Signed   By: Myles Rosenthal M.D.   On: 01/08/2016 17:52   Dg  Chest Port 1 View  01/09/2016  CLINICAL DATA:  Acute respiratory failure EXAM: PORTABLE CHEST 1 VIEW COMPARISON:  01/08/2016 FINDINGS: Endotracheal tube in good position. Right jugular catheter tip in the lower SVC unchanged. No pneumothorax Diffuse bilateral airspace disease shows mild interval improvement. No significant effusion. IMPRESSION: Endotracheal tube remains in good position Diffuse bilateral airspace disease with mild improvement Electronically Signed   By: Marlan Palau M.D.   On: 01/09/2016 06:42    Scheduled Meds: . antiseptic oral rinse  7 mL Mouth Rinse QID  . chlorhexidine gluconate  15 mL Mouth Rinse BID  . diltiazem  30 mg Oral 4 times per day  . doxycycline (VIBRAMYCIN) IV  100 mg Intravenous Q12H  . famotidine  20 mg Per  Tube BID  . feeding supplement (PRO-STAT SUGAR FREE 64)  30 mL Per Tube Daily  . free water  200 mL Per Tube 3 times per day  . heparin subcutaneous  5,000 Units Subcutaneous Q12H  . insulin aspart  0-15 Units Subcutaneous 6 times per day  . insulin glargine  10 Units Subcutaneous Daily  . [START ON 01/10/2016] methylPREDNISolone (SOLU-MEDROL) injection  40 mg Intravenous Q24H  . metoprolol tartrate  25 mg Per Tube Q8H  . polyethylene glycol  17 g Per NG tube BID  . senna-docusate  2 tablet Oral BID  . sodium chloride flush  10-40 mL Intracatheter Q12H  . voriconazole  4 mg/kg Intravenous Q12H   Continuous Infusions: . sodium chloride 10 mL/hr at 01/09/16 1400  . feeding supplement (VITAL AF 1.2 CAL) 1,000 mL (01/09/16 1400)  . fentaNYL infusion INTRAVENOUS 300 mcg/hr (01/09/16 1400)  . propofol (DIPRIVAN) infusion 40 mcg/kg/min (01/09/16 1530)    Assessment/Plan:  1. Clinical sepsis with pneumonia bilaterally increasing leukocytosis. Patient finished Zosyn and Zithromax course. Blood culture drawn on admission is a Skin contamination. Repeat cultures have been negative. Sputum culture from bronchial culture only grew out yeast. Patient placed on voriconazole. White blood cell count still very elevated. Patient empirically put on doxycycline also. 2. Septic shock- levophed has been stopped. 3. Acute respiratory failure with hypoxia. Secondary to rapidly progressing pneumonia. Bronchoscopy showing yeast. Patient on 85% FiO2. anca negative.  CT chest ordered by critical care specialist 4. GI bleed with drop in hemoglobin. Today's hemoglobin is higher at 8.9. Patient is on Protonix. 5. Nutrition - continue tube feeds.  6. Essential hypertension, tachycardia- patient started on metoprolol and Cardizem. Blood pressure and heart rate very labile 7. Hypokalemia, hypophosphatemia- replaced  Code Status:     Code Status Orders        Start     Ordered   01/02/2016 1823  Full code    Continuous     01/14/2016 1823    Code Status History    Date Active Date Inactive Code Status Order ID Comments User Context   This patient has a current code status but no historical code status.    Advance Directive Documentation        Most Recent Value   Type of Advance Directive  Living will   Pre-existing out of facility DNR order (yellow form or pink MOST form)     "MOST" Form in Place?       Family Communication: Spoke with sister on the phone. Disposition Plan: To be determined  Consultants:  Critical care specialist  Gastroenterologist  Infectious disease consultation  Procedures:  Intubation  bronchoscopy  Antibiotics:  Voriconazole  Doxycycline  Time spent: 32 minutes  critical care time  MACALLAN, ORD  [facility]

## 2016-01-09 NOTE — Progress Notes (Signed)
PULMONARY / CRITICAL CARE MEDICINE   Name: Larry Pacheco MRN: [id number] DOB: 15-May-1946    ADMISSION DATE:  Jan 01, 2016  BRIEF HISTORY: 70 year old male with no stomach and past medical history, admitted on 12/30/2011 for shortness of breath, and intermittent hemoptysis, started experiencing shortness of hemoptysis along with cough about one week ago after going out to the [place] to bird watch. Nonsmoker, states he is in good health otherwise. CTA chest negative for pulmonary embolus. Acute decline in respiratory status from nasal cannula to high flow oxygen to requiring intubation.  SUBJECTIVE:  No acute issues overnight. Still requiring  85% FiO2, the patient remains awake despite being of sedative drips. Tolerating pressure support ventilation.   VITAL SIGNS: Temp:  [97.9 F (36.6 C)-99.4 F (37.4 C)] 98.7 F (37.1 C) (02/23 0400) Pulse Rate:  [65-117] 114 (02/23 1100) Resp:  [12-29] 22 (02/23 1100) BP: (74-160)/(45-98) 137/78 mmHg (02/23 1100) SpO2:  [82 %-96 %] 82 % (02/23 1100) FiO2 (%):  [85 %] 85 % (02/23 1132) Weight:  [126 lb 12.2 oz (57.5 kg)] 126 lb 12.2 oz (57.5 kg) (02/23 0500) HEMODYNAMICS:   VENTILATOR SETTINGS: Vent Mode:  [-] PCV FiO2 (%):  [85 %] 85 % Set Rate:  [14 bmp] 14 bmp PEEP:  [10 cmH20] 10 cmH20 Plateau Pressure:  [20 cmH20-21 cmH20] 20 cmH20 INTAKE / OUTPUT:  Intake/Output Summary (Last 24 hours) at 01/09/16 1155 Last data filed at 01/09/16 1031  Gross per 24 hour  Intake 2885.56 ml  Output   2010 ml  Net 875.56 ml    Review of Systems  Unable to perform ROS: intubated    Physical Exam  Constitutional: He is well-developed, well-nourished, and in no distress.  HENT:  Head: Normocephalic.  Mouth/Throat: Oropharynx is clear and moist.  Eyes: Pupils are equal, round, and reactive to light.  Neck: Neck supple. No JVD present. No thyromegaly present.  Cardiovascular: Regular rhythm, normal heart sounds and intact distal pulses.   No murmur  heard. Pulmonary/Chest: No respiratory distress. He has no wheezes. He has no rales.  No distress on the vent.  No wheezes Breath sounds diminished in the bases  Abdominal: Soft. He exhibits distension. There is no tenderness. There is no rebound.  Mild distention but soft, hypoactive bowel sounds  Genitourinary: Penis normal.  Musculoskeletal: He exhibits no edema.  Neurological: He displays normal reflexes. No cranial nerve deficit.  Sedated on the vent. Withdraws to noxious stimulus  Skin: Skin is warm. He is diaphoretic.  Nursing note and vitals reviewed.    LABS:  CBC  Recent Labs Lab 01/07/16 0441 01/08/16 0425 01/09/16 0517  WBC 26.9* 32.8* 32.7*  HGB 9.0* 8.9* 8.4*  HCT 27.4* 27.1* 24.8*  PLT 588* 693* 710*   Coag's No results for input(s): APTT, INR in the last 168 hours. BMET  Recent Labs Lab 01/07/16 0441 01/08/16 0425 01/09/16 0517  NA 142 142 144  K 4.4 4.1 4.0  CL 104 100* 104  CO2 35* 37* 34*  BUN 27* 26* 30*  CREATININE 0.91 0.89 0.89  GLUCOSE 190* 189* 163*   Electrolytes  Recent Labs Lab 01/05/16 0426 01/06/16 0457 01/07/16 0441 01/08/16 0425 01/09/16 0517  CALCIUM 7.0* 7.0* 7.3* 7.6* 7.7*  MG 2.2 2.1  --   --  2.0  PHOS 2.1* 1.9*  --   --  2.3*   Sepsis Markers  Recent Labs Lab 01/09/16 0517  PROCALCITON 0.57   ABG  Recent Labs Lab 01/05/16 0450 01/06/16 0430 01/08/16  0446  PHART 7.44 7.49* 7.50*  PCO2ART 46 43 48  PO2ART 50* 56* 63*   Liver Enzymes  Recent Labs Lab 01/03/16 0608  AST 122*  ALT 101*  ALKPHOS 47  BILITOT 1.2  ALBUMIN 2.1*   Cardiac Enzymes No results for input(s): TROPONINI, PROBNP in the last 168 hours. Glucose  Recent Labs Lab 01/08/16 1125 01/08/16 1607 01/08/16 2014 01/09/16 0103 01/09/16 0401 01/09/16 0823  GLUCAP 204* 150* 145* 143* 147* 171*    Ct Chest W Contrast  01/08/2016  CLINICAL DATA:  Hemoptysis. Shortness breath. Cough. Acute respiratory failure. Community  acquired pneumonia and sepsis. EXAM: CT CHEST WITH CONTRAST TECHNIQUE: Multidetector CT imaging of the chest was performed during intravenous contrast administration. CONTRAST:  75mL OMNIPAQUE IOHEXOL 300 MG/ML  SOLN COMPARISON:  None. FINDINGS: Mediastinum/Lymph Nodes: Small mediastinal lymph nodes are seen in the AP window, largest measuring 12 mm on image 23. No other pathologically enlarged lymph nodes identified. Endotracheal tube and nasogastric tube seen in place. Lungs/Pleura: Diffuse bilateral pulmonary airspace disease is seen which may be due to diffuse pulmonary edema, ARDS, or pneumonia. Small pleural effusions seen bilaterally. No definite pulmonary mass identified. Upper abdomen: No acute findings. Musculoskeletal: No chest wall mass or suspicious bone lesions identified. IMPRESSION: Diffuse bilateral pulmonary airspace disease and small bilateral pleural effusions. Differential diagnosis includes diffuse pulmonary edema, ARDS, or pneumonia. Mild mediastinal lymphadenopathy in the AP window, likely reactive in etiology. Consider continued attention on follow-up CT. Electronically Signed   By: Myles Rosenthal M.D.   On: 01/08/2016 17:52   Dg Chest Port 1 View  01/09/2016  CLINICAL DATA:  Acute respiratory failure EXAM: PORTABLE CHEST 1 VIEW COMPARISON:  01/08/2016 FINDINGS: Endotracheal tube in good position. Right jugular catheter tip in the lower SVC unchanged. No pneumothorax Diffuse bilateral airspace disease shows mild interval improvement. No significant effusion. IMPRESSION: Endotracheal tube remains in good position Diffuse bilateral airspace disease with mild improvement Electronically Signed   By: Marlan Palau M.D.   On: 01/09/2016 06:42   STUDIES:  CTA chest 2/12>> no PE, multilobar basilar pneumonia, mildly tight findings, pneumonitis  SIGNIFICANT EVENTS: 2/16>> worsening hypoxia requiring high flow nasal cannula, unable to wean, subsequently intubated  Micro: MRSA PCR 02/15  >>NEG Rapid flu 02/13 >> NEG Blood 2/13 >> 1/2 staph saccharolyticus (likely contaminant) Urine 02/13 >> NEG C diff 02/15 >> NEG Resp 02/15 >> light growth candida BAL 2/16 >> heavy growth yeast (candida albicans) HIV 02/20 >> NEG Resp 02/20 >>  Pneumocystis DFA 02/20 >>  Legionella DFA 02/20 >>    Antibiotics: Azithro 02/13 >> 02/19 Ceftriaxone 02/13 >> 02/15 Vanc 2/15 >> 02/18 Zosyn 2/15 >>02/22  Voriconazole 2/22  Lines: R IJ CVL 02/16 >>  ETT 02/15 >>    ASSESSMENT / PLAN: 70 year old male no significant past medical history now with acute respiratory failure/ARDS, multifocal pneumonia of unclear etiology, cough, dyspnea, accidental fall, requiring supplemental oxygen, chest CT concerning for interstitial lung disease versus multifocal pneumonia/hypersensivity pneumonitis; with minimal improvement. Still requiring now requiring high FiO2 and high doses of sedation   A: PULMONARY VDRF Bilateral pulmonary infiltrates - favor pneumonitis or ARDS-Unable to wean due poor lung aeration on CXR despite treatment.  Hemoptysis-resolved P:  Cont vent support - settings reviewed and/or adjusted Better synchrony in PCV mode, will continue Use ARDS PEEP/FiO2 algorithm Will wean down steroids.   A: CARDIOVASCULAR Minimally elevated troponin - likely demand ischemia Hypertension Sinus tachycardia P:  Continue metoprolol 25 mg by mouth q8h  and PRN IV dose to maintain SBP<160 MAP goal > 65 mmHg  A: RENAL No issues P: Monitor BMET intermittently Monitor I/Os Correct electrolytes   A: GASTROINTESTINAL A: Melana-Resolved Ileus  P: SUP: IV famotidine Continue tube feeds.    A: HEMATOLOGIC Acute blood loss anemia - no active bleeding evident presently P: DVT px: Heparin 5000 units subcutaneous every 12. Using conservative dosing due to recent history of GI bleed. Monitor CBC intermittently Transfuse per usual guidelines  INFECTIOUS A: Pneumonitis - if  infectious, suspect atypical.  He has completed a course of antibacterial agents. He has now been started on voriconazole empirically due to Candida in bronchoscopy results. P: Monitor temp, WBC count F/u cultures Checked HIV 02/20 >> NEG   A: ENDOCRINE A: Steroid induced hyperglycemia-no prior dx of DM, glucose is now well controlled. P: Continue SSI - mod scale Continue Lantus at 10 units, sliding scale coverage as above.  A: NEUROLOGIC ICU/vent associated discomfort P:  RASS goal: -1, -2 PAD protocol - fent/propofol   Best Practice: Code Status:  Full. Diet: Tube feeds GI prophylaxis:  H2 blocker VTE prophylaxis:  Subcutaneous heparin.   Critical Care Attestation.  I have personally obtained a history, examined the patient, evaluated laboratory and imaging results, formulated the assessment and plan and placed orders. The Patient requires high complexity decision making for assessment and support, frequent evaluation and titration of therapies, application of advanced monitoring technologies and extensive interpretation of multiple databases. The patient has critical illness that could lead imminently to failure of 1 or more organ systems and requires the highest level of physician preparedness to intervene.  Critical Care Time devoted to patient care services described in this note is 35 minutes and is exclusive of time spent in procedures.

## 2016-01-09 NOTE — Progress Notes (Signed)
Pharmacy Antibiotic Note  Larry Pacheco is a 70 y.o. male admitted on 12/31/2015 with pneumonia and sepsis.  Pharmacy consulted for Voriconazole dosing - Day 2.  Plan: Will continue patient on voriconazole  IV Q12hr.    Height:  (167.6 cm) Weight: 126 lb 12.2 oz (57.5 kg) IBW/kg (Calculated) : 63.8  Temp (24hrs), Avg:98.8 F (37.1 C), Min:98.3 F (36.8 C), Max:99.4 F (37.4 C)   Recent Labs Lab 01/05/16 0426 01/06/16 0457 01/07/16 0441 01/08/16 0425 01/09/16 0517  WBC 17.1* 22.4* 26.9* 32.8* 32.7*  CREATININE 1.07 0.87 0.91 0.89 0.89    Estimated Creatinine Clearance: 61.9 mL/min (by C-G formula based on Cr of 0.89).    No Known Allergies   Antibiotics:  Ceftriaxone 2/13 >>2/14 Azithromycin 2/13 >>2/14, 2/16>>2/18  Vanc 2/15>>2/15 Zosyn 2/15>>2/22 Voriconazole 2/22 Doxycycline 2/23  Microbiology:  02/13 Flu: negative 02/15 MRSA PCR: negative 02/13 Urine cx: negative 02/15 GI panel: negative 02/15 C diff: negative 02/15 Sputum cx: pending 02/15 GPC in 1/4 bottles 02/16 Bronch Wash: Heavy Growth Candida Albicans  02/20 Respiratory Culture: Heavy Growth Yeast/Mold sent  Pharmacy will continue to monitor and adjust per consult.  Chantal Worthey L 01/09/2016 3:32 PM

## 2016-01-09 NOTE — Consult Note (Signed)
[facility] Infectious Disease     Reason for Consult: PNA, leukocytosis    Referring Physician: Cordelia Poche Date of Admission:  12/28/2015   Active Problems:   Pneumonia   Community acquired pneumonia   Hemoptysis   Sepsis ([place])   HPI: Larry Pacheco is a 70 y.o. male previously healthy admitted with 5 days of cough and weakness. He had CXR with severe intersititial disease.  WBC on admit was 8.0 with nml diff but has steadily climbed to 32.   CT showed extensive patchy peripheral predominant ground glass opacity and reticulation.  Progressed to requiring intubation, had bronch done with negative cultures,  ANCA, ANA, neg. Flu negative, HIV negative, strep PNA urine ag negative,     History reviewed. No pertinent past medical history. No past surgical history on file. Social History  Substance Use Topics  . Smoking status: Never Smoker   . Smokeless tobacco: None  . Alcohol Use: None   Family History  Problem Relation Age of Onset  . Bowel Disease Father     Allergies: No Known Allergies  Current antibiotics: Antibiotics Given (last 72 hours)    Date/Time Action Medication Dose Rate   01/06/16 2038 Given   piperacillin-tazobactam (ZOSYN) IVPB 3.375 g 3.375 g 12.5 mL/hr   01/07/16 0423 Given   piperacillin-tazobactam (ZOSYN) IVPB 3.375 g 3.375 g 12.5 mL/hr   01/07/16 1359 Given   piperacillin-tazobactam (ZOSYN) IVPB 3.375 g 3.375 g 12.5 mL/hr   01/07/16 1944 Given   piperacillin-tazobactam (ZOSYN) IVPB 3.375 g 3.375 g 12.5 mL/hr   01/08/16 0419 Given   piperacillin-tazobactam (ZOSYN) IVPB 3.375 g 3.375 g 12.5 mL/hr      MEDICATIONS: . antiseptic oral rinse  7 mL Mouth Rinse QID  . chlorhexidine gluconate  15 mL Mouth Rinse BID  . diltiazem  30 mg Oral 4 times per day  . famotidine  20 mg Per Tube BID  . feeding supplement (PRO-STAT SUGAR FREE 64)  30 mL Per Tube Daily  . free water  200 mL Per Tube 3 times per day  . heparin subcutaneous  5,000 Units  Subcutaneous Q12H  . insulin aspart  0-15 Units Subcutaneous 6 times per day  . insulin glargine  10 Units Subcutaneous Daily  . [START ON 01/10/2016] methylPREDNISolone (SOLU-MEDROL) injection  40 mg Intravenous Q24H  . metoprolol tartrate  25 mg Per Tube Q8H  . polyethylene glycol  17 g Per NG tube BID  . senna-docusate  2 tablet Oral BID  . sodium chloride flush  10-40 mL Intracatheter Q12H  . voriconazole  4 mg/kg Intravenous Q12H    Review of Systems - intubated OBJECTIVE: Temp:  [98.3 F (36.8 C)-99.4 F (37.4 C)] 98.3 F (36.8 C) (02/23 1200) Pulse Rate:  [65-117] 112 (02/23 1200) Resp:  [12-29] 21 (02/23 1200) BP: (74-160)/(45-98) 137/85 mmHg (02/23 1200) SpO2:  [82 %-96 %] 93 % (02/23 1200) FiO2 (%):  [85 %] 85 % (02/23 1200) Weight:  [57.5 kg (126 lb 12.2 oz)] 57.5 kg (126 lb 12.2 oz) (02/23 0500) Physical Exam  Constitutional: thin, intubated,  HENT: [place], no icterus Mouth/Throat: ETT in place Cardiovascular: Tachy, reg Pulmonary/Chest: Mech BS Abdominal: Soft. Bowel sounds are normal. He exhibits no distension. There is no tenderness.  Lymphadenopathy: He has no cervical adenopathy.  Neurological: opens eyes.  Skin: Skin is warm and dry. No rash noted. No erythema.  Psychiatric: intubated  LABS: Results for orders placed or performed during the hospital encounter of 12/26/2015 (from the  past 48 hour(s))  Glucose, capillary     Status: Abnormal   Collection Time: 01/07/16  4:02 PM  Result Value Ref Range   Glucose-Capillary 185 (H) 65 - 99 mg/dL  Glucose, capillary     Status: Abnormal   Collection Time: 01/07/16  7:48 PM  Result Value Ref Range   Glucose-Capillary 166 (H) 65 - 99 mg/dL  Glucose, capillary     Status: Abnormal   Collection Time: 01/08/16 12:39 AM  Result Value Ref Range   Glucose-Capillary 182 (H) 65 - 99 mg/dL  Glucose, capillary     Status: Abnormal   Collection Time: 01/08/16  4:06 AM  Result Value Ref Range   Glucose-Capillary 147 (H)  65 - 99 mg/dL  CBC     Status: Abnormal   Collection Time: 01/08/16  4:25 AM  Result Value Ref Range   WBC 32.8 (H) 3.8 - 10.6 K/uL   RBC 3.02 (L) 4.40 - 5.90 MIL/uL   Hemoglobin 8.9 (L) 13.0 - 18.0 g/dL   HCT 27.1 (L) 40.0 - 52.0 %   MCV 89.8 80.0 - 100.0 fL   MCH 29.5 26.0 - 34.0 pg   MCHC 32.9 32.0 - 36.0 g/dL   RDW 14.0 11.5 - 14.5 %   Platelets 693 (H) 150 - 440 K/uL  Basic metabolic panel     Status: Abnormal   Collection Time: 01/08/16  4:25 AM  Result Value Ref Range   Sodium 142 135 - 145 mmol/L   Potassium 4.1 3.5 - 5.1 mmol/L   Chloride 100 (L) 101 - 111 mmol/L   CO2 37 (H) 22 - 32 mmol/L   Glucose, Bld 189 (H) 65 - 99 mg/dL   BUN 26 (H) 6 - 20 mg/dL   Creatinine, Ser 0.89 0.61 - 1.24 mg/dL   Calcium 7.6 (L) 8.9 - 10.3 mg/dL   GFR calc non Af Amer >60 >60 mL/min   GFR calc Af Amer >60 >60 mL/min    Comment: (NOTE) The eGFR has been calculated using the CKD EPI equation. This calculation has not been validated in all clinical situations. eGFR's persistently <60 mL/min signify possible Chronic Kidney Disease.    Anion gap 5 5 - 15  Differential     Status: Abnormal   Collection Time: 01/08/16  4:25 AM  Result Value Ref Range   Neutrophils Relative % 95% %   Neutro Abs 31.0 (H) 1.4 - 6.5 K/uL   Lymphocytes Relative 1% %   Lymphs Abs 0.4 (L) 1.0 - 3.6 K/uL   Monocytes Relative 4% %   Monocytes Absolute 1.3 (H) 0.2 - 1.0 K/uL   Eosinophils Relative 0% %   Eosinophils Absolute 0.0 0 - 0.7 K/uL   Basophils Relative 0% %   Basophils Absolute 0.1 0 - 0.1 K/uL  Blood gas, arterial     Status: Abnormal   Collection Time: 01/08/16  4:46 AM  Result Value Ref Range   FIO2 0.85    Delivery systems VENTILATOR    Mode PRESSURE CONTROL    LHR 14 resp/min   Peep/cpap 10.0 cm H20   pH, Arterial 7.50 (H) 7.350 - 7.450   pCO2 arterial 48 32.0 - 48.0 mmHg   pO2, Arterial 63 (L) 83.0 - 108.0 mmHg   Bicarbonate 37.4 (H) 21.0 - 28.0 mEq/L   Acid-Base Excess 12.8 (H) 0.0 -  3.0 mmol/L   O2 Saturation 93.7 %   Patient temperature 37.0    Collection site RIGHT RADIAL  Sample type ARTERIAL DRAW    Allens test (pass/fail) POSITIVE (A) PASS  Glucose, capillary     Status: Abnormal   Collection Time: 01/08/16  7:25 AM  Result Value Ref Range   Glucose-Capillary 174 (H) 65 - 99 mg/dL  Glucose, capillary     Status: Abnormal   Collection Time: 01/08/16 11:25 AM  Result Value Ref Range   Glucose-Capillary 204 (H) 65 - 99 mg/dL  Glucose, capillary     Status: Abnormal   Collection Time: 01/08/16  4:07 PM  Result Value Ref Range   Glucose-Capillary 150 (H) 65 - 99 mg/dL  Glucose, capillary     Status: Abnormal   Collection Time: 01/08/16  8:14 PM  Result Value Ref Range   Glucose-Capillary 145 (H) 65 - 99 mg/dL  Glucose, capillary     Status: Abnormal   Collection Time: 01/09/16  1:03 AM  Result Value Ref Range   Glucose-Capillary 143 (H) 65 - 99 mg/dL  Glucose, capillary     Status: Abnormal   Collection Time: 01/09/16  4:01 AM  Result Value Ref Range   Glucose-Capillary 147 (H) 65 - 99 mg/dL   Comment 1 Notify RN   CBC     Status: Abnormal   Collection Time: 01/09/16  5:17 AM  Result Value Ref Range   WBC 32.7 (H) 3.8 - 10.6 K/uL   RBC 2.82 (L) 4.40 - 5.90 MIL/uL   Hemoglobin 8.4 (L) 13.0 - 18.0 g/dL   HCT 24.8 (L) 40.0 - 52.0 %   MCV 87.9 80.0 - 100.0 fL   MCH 29.6 26.0 - 34.0 pg   MCHC 33.7 32.0 - 36.0 g/dL   RDW 14.2 11.5 - 14.5 %   Platelets 710 (H) 150 - 440 K/uL  Basic metabolic panel     Status: Abnormal   Collection Time: 01/09/16  5:17 AM  Result Value Ref Range   Sodium 144 135 - 145 mmol/L   Potassium 4.0 3.5 - 5.1 mmol/L   Chloride 104 101 - 111 mmol/L   CO2 34 (H) 22 - 32 mmol/L   Glucose, Bld 163 (H) 65 - 99 mg/dL   BUN 30 (H) 6 - 20 mg/dL   Creatinine, Ser 0.89 0.61 - 1.24 mg/dL   Calcium 7.7 (L) 8.9 - 10.3 mg/dL   GFR calc non Af Amer >60 >60 mL/min   GFR calc Af Amer >60 >60 mL/min    Comment: (NOTE) The eGFR has been  calculated using the CKD EPI equation. This calculation has not been validated in all clinical situations. eGFR's persistently <60 mL/min signify possible Chronic Kidney Disease.    Anion gap 6 5 - 15  Magnesium     Status: None   Collection Time: 01/09/16  5:17 AM  Result Value Ref Range   Magnesium 2.0 1.7 - 2.4 mg/dL  Phosphorus     Status: Abnormal   Collection Time: 01/09/16  5:17 AM  Result Value Ref Range   Phosphorus 2.3 (L) 2.5 - 4.6 mg/dL  Procalcitonin     Status: None   Collection Time: 01/09/16  5:17 AM  Result Value Ref Range   Procalcitonin 0.57 ng/mL    Comment:        Interpretation: PCT > 0.5 ng/mL and <= 2 ng/mL: Systemic infection (sepsis) is possible, but other conditions are known to elevate PCT as well. (NOTE)         ICU PCT Algorithm  Non ICU PCT Algorithm    ----------------------------     ------------------------------         PCT < 0.25 ng/mL                 PCT < 0.1 ng/mL     Stopping of antibiotics            Stopping of antibiotics       strongly encouraged.               strongly encouraged.    ----------------------------     ------------------------------       PCT level decrease by               PCT < 0.25 ng/mL       >= 80% from peak PCT       OR PCT 0.25 - 0.5 ng/mL          Stopping of antibiotics                                             encouraged.     Stopping of antibiotics           encouraged.    ----------------------------     ------------------------------       PCT level decrease by              PCT >= 0.25 ng/mL       < 80% from peak PCT        AND PCT >= 0.5 ng/mL             Continuing antibiotics                                              encouraged.       Continuing antibiotics            encouraged.    ----------------------------     ------------------------------     PCT level increase compared          PCT > 0.5 ng/mL         with peak PCT AND          PCT >= 0.5 ng/mL             Escalation of  antibiotics                                          strongly encouraged.      Escalation of antibiotics        strongly encouraged.   Glucose, capillary     Status: Abnormal   Collection Time: 01/09/16  8:23 AM  Result Value Ref Range   Glucose-Capillary 171 (H) 65 - 99 mg/dL  Glucose, capillary     Status: Abnormal   Collection Time: 01/09/16 12:02 PM  Result Value Ref Range   Glucose-Capillary 188 (H) 65 - 99 mg/dL   No components found for: ESR, C REACTIVE PROTEIN MICRO: Recent Results (from the past 720 hour(s))  Rapid Influenza A&B Antigens ([facility] only)     Status: None   Collection Time: 01/11/2016  3:01 PM  Result Value Ref  Range Status   Influenza A ([place]) NEGATIVE  Final   Influenza B ([facility]) NEGATIVE  Final  Blood Culture (routine x 2)     Status: None   Collection Time: 12/27/2015  3:55 PM  Result Value Ref Range Status   Specimen Description BLOOD RIGHT ASSIST CONTROL  Final   Special Requests   Final    BOTTLES DRAWN AEROBIC AND ANAEROBIC  6CC AERO, 5CC ANA   Culture  Setup Time   Final    GRAM POSITIVE COCCI ANAEROBIC BOTTLE ONLY CRITICAL RESULT CALLED TO, READ BACK BY AND VERIFIED WITH: Dicie Beam 01/02/16 1250PM MLM Organism ID to follow    Culture   Final    STAPHYLOCOCCUS SACCHAROLYTICUS ANAEROBIC BOTTLE ONLY Results consistent with contamination.    Report Status 01/05/2016 FINAL  Final  Blood Culture ID Panel (Reflexed)     Status: None   Collection Time: 01/11/2016  3:55 PM  Result Value Ref Range Status   Enterococcus species NOT DETECTED NOT DETECTED Final   Listeria monocytogenes NOT DETECTED NOT DETECTED Final   Staphylococcus species NOT DETECTED NOT DETECTED Final   Staphylococcus aureus NOT DETECTED NOT DETECTED Final   Streptococcus species NOT DETECTED NOT DETECTED Final   Streptococcus agalactiae NOT DETECTED NOT DETECTED Final   Streptococcus pneumoniae NOT DETECTED NOT DETECTED Final   Streptococcus pyogenes NOT DETECTED NOT DETECTED Final    Acinetobacter baumannii NOT DETECTED NOT DETECTED Final   Enterobacteriaceae species NOT DETECTED NOT DETECTED Final   Enterobacter cloacae complex NOT DETECTED NOT DETECTED Final   Escherichia coli NOT DETECTED NOT DETECTED Final   Klebsiella oxytoca NOT DETECTED NOT DETECTED Final   Klebsiella pneumoniae NOT DETECTED NOT DETECTED Final   Proteus species NOT DETECTED NOT DETECTED Final   Serratia marcescens NOT DETECTED NOT DETECTED Final   Haemophilus influenzae NOT DETECTED NOT DETECTED Final   Neisseria meningitidis NOT DETECTED NOT DETECTED Final   Pseudomonas aeruginosa NOT DETECTED NOT DETECTED Final   Candida albicans NOT DETECTED NOT DETECTED Final   Candida glabrata NOT DETECTED NOT DETECTED Final   Candida krusei NOT DETECTED NOT DETECTED Final   Candida parapsilosis NOT DETECTED NOT DETECTED Final   Candida tropicalis NOT DETECTED NOT DETECTED Final   Carbapenem resistance NOT DETECTED NOT DETECTED Final   Methicillin resistance NOT DETECTED NOT DETECTED Final   Vancomycin resistance NOT DETECTED NOT DETECTED Final  Blood Culture (routine x 2)     Status: None   Collection Time: 01/03/2016  4:01 PM  Result Value Ref Range Status   Specimen Description BLOOD LEFT ASSIST CONTROL  Final   Special Requests   Final    BOTTLES DRAWN AEROBIC AND ANAEROBIC  3CC AERO, 2CC ANA   Culture NO GROWTH 5 DAYS  Final   Report Status 01/04/2016 FINAL  Final  Urine culture     Status: None   Collection Time: 12/20/2015  6:36 PM  Result Value Ref Range Status   Specimen Description URINE, RANDOM  Final   Special Requests NONE  Final   Culture NO GROWTH 1 DAY  Final   Report Status 01/01/2016 FINAL  Final  MRSA PCR Screening     Status: None   Collection Time: 01/01/16  2:08 AM  Result Value Ref Range Status   MRSA by PCR NEGATIVE NEGATIVE Final    Comment:        The GeneXpert MRSA Assay (FDA approved for NASAL specimens only), is one component of a comprehensive MRSA  colonization  surveillance program. It is not intended to diagnose MRSA infection nor to guide or monitor treatment for MRSA infections.   Gastrointestinal Panel by PCR , Stool     Status: None   Collection Time: 01/01/16  6:59 AM  Result Value Ref Range Status   Campylobacter species NOT DETECTED NOT DETECTED Final   Plesimonas shigelloides NOT DETECTED NOT DETECTED Final   Salmonella species NOT DETECTED NOT DETECTED Final   Yersinia enterocolitica NOT DETECTED NOT DETECTED Final   Vibrio species NOT DETECTED NOT DETECTED Final   Vibrio cholerae NOT DETECTED NOT DETECTED Final   Enteroaggregative E coli (EAEC) NOT DETECTED NOT DETECTED Final   Enteropathogenic E coli (EPEC) NOT DETECTED NOT DETECTED Final   Enterotoxigenic E coli (ETEC) NOT DETECTED NOT DETECTED Final   Shiga like toxin producing E coli (STEC) NOT DETECTED NOT DETECTED Final   E. coli O157 NOT DETECTED NOT DETECTED Final   Shigella/Enteroinvasive E coli (EIEC) NOT DETECTED NOT DETECTED Final   Cryptosporidium NOT DETECTED NOT DETECTED Final   Cyclospora cayetanensis NOT DETECTED NOT DETECTED Final   Entamoeba histolytica NOT DETECTED NOT DETECTED Final   Giardia lamblia NOT DETECTED NOT DETECTED Final   Adenovirus F40/41 NOT DETECTED NOT DETECTED Final   Astrovirus NOT DETECTED NOT DETECTED Final   Norovirus GI/GII NOT DETECTED NOT DETECTED Final   Rotavirus A NOT DETECTED NOT DETECTED Final   Sapovirus (I, II, IV, and V) NOT DETECTED NOT DETECTED Final  C difficile quick scan w PCR reflex     Status: None   Collection Time: 01/01/16  6:59 AM  Result Value Ref Range Status   C Diff antigen NEGATIVE NEGATIVE Final   C Diff toxin NEGATIVE NEGATIVE Final   C Diff interpretation Negative for C. difficile  Final  Culture, expectorated sputum-assessment     Status: None   Collection Time: 01/01/16 10:46 AM  Result Value Ref Range Status   Specimen Description SPUTUM  Final   Special Requests Normal  Final    Sputum evaluation THIS SPECIMEN IS ACCEPTABLE FOR SPUTUM CULTURE  Final   Report Status 01/01/2016 FINAL  Final  Culture, respiratory (NON-Expectorated)     Status: None   Collection Time: 01/01/16 10:46 AM  Result Value Ref Range Status   Specimen Description SPUTUM  Final   Special Requests Normal Reflexed from [id number]  Final   Gram Stain   Final    FAIR SPECIMEN - 70-80% WBCS FEW SQUAMOUS EPITHELIAL CELLS PRESENT MODERATE WBC SEEN MODERATE YEAST FEW GRAM NEGATIVE RODS RARE GRAM POSITIVE COCCI IN PAIRS    Culture LIGHT GROWTH CANDIDA ALBICANS  Final   Report Status 01/04/2016 FINAL  Final  Fungus Culture with Smear     Status: None (Preliminary result)   Collection Time: 01/02/16  2:52 PM  Result Value Ref Range Status   Specimen Description BRONCHIAL WASHINGS  Final   Special Requests Normal  Final   Culture CANDIDA ALBICANS  Final   Report Status PENDING  Incomplete  Culture, bal-quantitative     Status: None   Collection Time: 01/02/16  2:52 PM  Result Value Ref Range Status   Specimen Description BRONCHIAL WASHINGS  Final   Special Requests Normal  Final   Gram Stain   Final    FAIR SPECIMEN - 70-80% WBCS FEW SQUAMOUS EPITHELIAL CELLS PRESENT MODERATE WBC SEEN MANY YEAST    Culture HEAVY GROWTH CANDIDA ALBICANS  Final   Report Status 01/06/2016 FINAL  Final  Culture, blood (Routine  X 2) w Reflex to ID Panel     Status: None (Preliminary result)   Collection Time: 01/06/16 10:42 AM  Result Value Ref Range Status   Specimen Description BLOOD RIGHT ASSIST CONTROL  Final   Special Requests BOTTLES DRAWN AEROBIC AND ANAEROBIC [place]  Final   Culture NO GROWTH 3 DAYS  Final   Report Status PENDING  Incomplete  Culture, blood (Routine X 2) w Reflex to ID Panel     Status: None (Preliminary result)   Collection Time: 01/06/16 10:51 AM  Result Value Ref Range Status   Specimen Description BLOOD LEFT ASSIST CONTROL  Final   Special Requests BOTTLES DRAWN AEROBIC AND  ANAEROBIC [place]  Final   Culture NO GROWTH 3 DAYS  Final   Report Status PENDING  Incomplete  Culture, respiratory (NON-Expectorated)     Status: None (Preliminary result)   Collection Time: 01/06/16 12:08 PM  Result Value Ref Range Status   Specimen Description TRACHEAL ASPIRATE  Final   Special Requests NONE  Final   Gram Stain   Final    FEW WBC SEEN MANY YEAST GOOD SPECIMEN - 80-90% WBCS    Culture   Final    HEAVY GROWTH CANDIDA ALBICANS MOLD SENDING TO [facility] FOR IDENTIFICATION ONCE ISOLATED    Report Status PENDING  Incomplete    IMAGING: Dg Chest 1 View  01/08/2016  CLINICAL DATA:  Sepsis, community-acquired pneumonia, respiratory failure with intubation. EXAM: CHEST 1 VIEW COMPARISON:  Portable chest x-ray of January 07, 2016 FINDINGS: The lungs are adequately inflated. Confluent interstitial and alveolar opacities persist bilaterally. There is no large pleural effusion. There is no pneumothorax. The heart is normal in size. The pulmonary vascularity is indistinct. The endotracheal tube tip lies 4.8 cm above the carina. The esophagogastric tube tip projects below the inferior margin of the image. The right internal jugular venous catheter tip projects over the midportion of the SVC. IMPRESSION: Stable interstitial and alveolar opacities consistent with pneumonia, less likely pulmonary edema. The support tubes are in reasonable position. Electronically Signed   By:   [place] M.D.   On: 01/08/2016 07:14   Dg Chest 1 View  01/06/2016  CLINICAL DATA:  Dyspnea, community-acquired pneumonia, sepsis, hemoptysis. EXAM: CHEST 1 VIEW COMPARISON:  Portable chest x-ray of January 05, 2016 FINDINGS: The lungs are well-expanded. There remain confluent increased interstitial densities bilaterally. There is no pneumothorax or pleural effusion. The heart is normal in size. The pulmonary vascularity is indistinct but no more than mildly engorged. The mediastinum is normal in width.  The endotracheal tube tip lies 4.9 cm above the carina. The esophagogastric tube tip projects below the inferior margin of the image. The right internal jugular venous catheter tip projects over the midportion of the SVC. IMPRESSION: Stable bilateral interstitial densities most compatible with edema or pneumonia. There is no pneumothorax or pleural effusion. The support tubes are in reasonable position. Electronically Signed   By:   [place] M.D.   On: 01/06/2016 07:15   Dg Chest 1 View  01/01/2016  CLINICAL DATA:  Patient's coughing up blood tonight. Shortness of breath. EXAM: CHEST 1 VIEW COMPARISON:  12/28/2015 FINDINGS: Borderline heart size with normal pulmonary vascularity for technique. Suggestion of patchy developing airspace disease in the right mid lung could represent pneumonia or hemorrhage. No blunting of costophrenic angles. No pneumothorax. IMPRESSION: Developing focal infiltration in the right mid lung. Electronically Signed   By: Lucienne Capers M.D.   On: 01/01/2016 01:56   Dg  Chest 2 View  01/09/2016  CLINICAL DATA:  Cough and shortness of breath EXAM: CHEST  2 VIEW COMPARISON:  None. FINDINGS: There is an element of hyperinflation suggesting the possibility of COPD. The heart size and vascular pattern are normal. There is no consolidation or pleural effusion. There is moderately prominent interstitial change bilaterally with an apical basilar gradient, most severe at the lung bases. IMPRESSION: Findings suggest COPD. Moderately severe interstitial disease. Acuity uncertain as there are no comparison studies. Appearance suggests possibility of chronic interstitial lung disease but superimposed atypical pneumonia not excluded. Electronically Signed   By: Skipper Cliche M.D.   On: 12/29/2015 15:36   Dg Abd 1 View  01/05/2016  CLINICAL DATA:  Sudden onset of vomiting today.  ICU patient. EXAM: ABDOMEN - 1 VIEW COMPARISON:  CT, 01/03/2016 FINDINGS: Normal bowel gas pattern. There is some  residual contrast in the colon. Nasal/ orogastric tube projects in the mid stomach, well positioned. No evidence of renal or ureteral stones. Soft tissues are unremarkable. IMPRESSION: 1. No acute findings. No evidence of bowel obstruction. Well-positioned nasal/orogastric tube. Electronically Signed   By: Lajean Manes M.D.   On: 01/05/2016 12:15   Ct Head Wo Contrast  01/01/2016  CLINICAL DATA:  Fall out of bed in the hospital.  Confusion. EXAM: CT HEAD WITHOUT CONTRAST TECHNIQUE: Contiguous axial images were obtained from the base of the skull through the vertex without intravenous contrast. COMPARISON:  None. FINDINGS: There is atrophy and chronic small vessel disease changes. No acute intracranial abnormality. Specifically, no hemorrhage, hydrocephalus, mass lesion, acute infarction, or significant intracranial injury. No acute calvarial abnormality. Posterior mucosal thickening versus air-fluid level in the right maxillary sinus. Remainder the paranasal sinuses and mastoid air cells are clear. IMPRESSION: No acute intracranial abnormality. Atrophy, chronic microvascular disease. Electronically Signed   By: Rolm Baptise M.D.   On: 01/01/2016 10:30   Ct Chest W Contrast  01/08/2016  CLINICAL DATA:  Hemoptysis. Shortness breath. Cough. Acute respiratory failure. Community acquired pneumonia and sepsis. EXAM: CT CHEST WITH CONTRAST TECHNIQUE: Multidetector CT imaging of the chest was performed during intravenous contrast administration. CONTRAST:  43m OMNIPAQUE IOHEXOL 300 MG/ML  SOLN COMPARISON:  None. FINDINGS: Mediastinum/Lymph Nodes: Small mediastinal lymph nodes are seen in the AP window, largest measuring 12 mm on image 23. No other pathologically enlarged lymph nodes identified. Endotracheal tube and nasogastric tube seen in place. Lungs/Pleura: Diffuse bilateral pulmonary airspace disease is seen which may be due to diffuse pulmonary edema, ARDS, or pneumonia. Small pleural effusions seen  bilaterally. No definite pulmonary mass identified. Upper abdomen: No acute findings. Musculoskeletal: No chest wall mass or suspicious bone lesions identified. IMPRESSION: Diffuse bilateral pulmonary airspace disease and small bilateral pleural effusions. Differential diagnosis includes diffuse pulmonary edema, ARDS, or pneumonia. Mild mediastinal lymphadenopathy in the AP window, likely reactive in etiology. Consider continued attention on follow-up CT. Electronically Signed   By: JEarle GellM.D.   On: 01/08/2016 17:52   Ct Angio Chest Pe W/cm &/or Wo Cm  12/29/2015  CLINICAL DATA:  Dyspnea.  Cough.  Chest pain. EXAM: CT ANGIOGRAPHY CHEST WITH CONTRAST TECHNIQUE: Multidetector CT imaging of the chest was performed using the standard protocol during bolus administration of intravenous contrast. Multiplanar CT image reconstructions and MIPs were obtained to evaluate the vascular anatomy. CONTRAST:  736mOMNIPAQUE IOHEXOL 350 MG/ML SOLN COMPARISON:  Chest radiograph from earlier today. No prior chest CT. FINDINGS: Mediastinum/Nodes: The study is moderate quality for the evaluation of pulmonary embolism,  limited by motion artifact particularly at the lung bases. There are no filling defects in the central, lobar, segmental or subsegmental pulmonary artery branches to suggest acute pulmonary embolism. Great vessels are normal in course and caliber. Normal heart size. No pericardial fluid/thickening. Coronary atherosclerosis. Normal visualized thyroid. Mild circumferential wall thickening in the mid to lower thoracic esophagus. No axillary adenopathy. Mildly enlarged 1.2 cm subcarinal node (series 5/ image 69). No additional pathologically enlarged mediastinal or hilar nodes. Lungs/Pleura: No pneumothorax. No pleural effusion. There is extensive patchy reticulation and ground-glass opacity throughout both lungs involving the lung periphery greater than the peribronchovascular lungs. No traction bronchiectasis or  frank honeycombing. No acute consolidative airspace disease, significant pulmonary nodules or lung masses. Upper abdomen: Simple 1.1 cm lateral segment left liver lobe cyst. Musculoskeletal:  No aggressive appearing focal osseous lesions. Review of the MIP images confirms the above findings. IMPRESSION: 1. No evidence of acute pulmonary embolism, with limitations as described. 2. Extensive patchy peripheral predominant ground-glass opacity and reticulation throughout both lungs, of uncertain acuity given lack of prior chest imaging studies for comparison. Differential includes nonspecific interstitial pneumonia (NSIP) versus other atypical inflammatory conditions such as cryptogenic organizing pneumonia, eosinophilic pneumonia or vasculitis. A follow-up high-resolution chest CT study in 3-6 months is recommended. 3. Mild subcarinal mediastinal lymphadenopathy, nonspecific and likely reactive. 4. Nonspecific mild circumferential wall thickening in the mid to lower thoracic esophagus, most suggestive of a nonspecific esophagitis. Electronically Signed   By: Ilona Sorrel M.D.   On: 01/03/2016 17:24   Ct Abdomen Pelvis W Contrast  01/03/2016  CLINICAL DATA:  Shortness of breath, abdominal distension EXAM: CT ABDOMEN AND PELVIS WITH CONTRAST TECHNIQUE: Multidetector CT imaging of the abdomen and pelvis was performed using the standard protocol following bolus administration of intravenous contrast. CONTRAST:  150m OMNIPAQUE IOHEXOL 300 MG/ML  SOLN COMPARISON:  CT chest 01/11/2016 FINDINGS: Images of the lung bases shows bilateral lower lobe infiltrates suspicious for pneumonia. Small bilateral pleural effusion. There is NG tube with tip in distal stomach. Small hiatal hernia is noted. Heart size within normal limits. Mild fatty infiltration of the liver.  No focal hepatic mass. No calcified gallstones are noted within gallbladder. Enhanced pancreas, spleen and adrenal glands are unremarkable. Kidneys are symmetrical  in size and enhancement. Small right perinephric fluid. No aortic aneurysm. Mild atherosclerotic calcifications of distal abdominal aorta and common iliac arteries. There is no small bowel obstruction. No ascites or free air. No adenopathy. The terminal ileum is unremarkable. Normal appendix partially visualized in axial image 41. Distended urinary bladder is noted. Delayed renal images shows shows no significant excretion bilaterally. Please correlate with renal function. Oral contrast material noted in right colon. Moderate colonic gas and contrast material noted within transverse colon. There is no colitis or diverticulitis. Descending colon and sigmoid colon is empty partially collapsed. There is a right inguinal canal hernia containing fat measures 1.8 cm. Left inguinal canal hernia containing fat measures 3.2 cm. There is no evidence of acute complication. Pelvic phleboliths are noted. Mild distended urinary bladder. Prostate gland and seminal vesicles are unremarkable. IMPRESSION: 1. Extensive bilateral lower lobe infiltrates are noted highly suspicious for bilateral pneumonia. Small bilateral pleural effusion. 2. There is NG tube in place.  Small hiatal hernia. 3. Fatty infiltration of the liver is noted. 4. There is small amount of right perinephric fluid. Trace left perinephric stranding. Delayed renal images shows no significant excretion. Please correlate with renal function. 5. No small bowel or colonic obstruction. 6.  No pericecal inflammation. Normal appendix. Contrast material is noted in right colon. Moderate gas and contrast material noted within transverse colon. 7. Bilateral inguinal hernia containing fat without evidence of acute complication. Electronically Signed   By: Lahoma Crocker M.D.   On: 01/03/2016 16:50   Dg Chest Port 1 View  01/09/2016  CLINICAL DATA:  Acute respiratory failure EXAM: PORTABLE CHEST 1 VIEW COMPARISON:  01/08/2016 FINDINGS: Endotracheal tube in good position. Right  jugular catheter tip in the lower SVC unchanged. No pneumothorax Diffuse bilateral airspace disease shows mild interval improvement. No significant effusion. IMPRESSION: Endotracheal tube remains in good position Diffuse bilateral airspace disease with mild improvement Electronically Signed   By: Franchot Gallo M.D.   On: 01/09/2016 06:42   Dg Chest Port 1 View  01/07/2016  CLINICAL DATA:  Respiratory failure. EXAM: PORTABLE CHEST 1 VIEW COMPARISON:  01/06/2016 . FINDINGS: Endotracheal tube and NG tube in stable position. Right IJ line stable position. Mild cardiomegaly. Progressive bilateral pulmonary alveolar infiltrates. Findings could be secondary to pulmonary edema. Progressive bilateral pneumonia could also present this fashion. No pleural effusion or pneumothorax. IMPRESSION: 1. Lines and tubes in stable position. 2. Mild cardiomegaly. Progressive bilateral pulmonary alveolar infiltrates. These findings could be secondary to pulmonary edema. Bilateral pneumonia could also present in this fashion. Electronically Signed   By: Marcello Moores  Register   On: 01/07/2016 07:26   Dg Chest Port 1 View  01/05/2016  CLINICAL DATA:  Respiratory failure EXAM: PORTABLE CHEST 1 VIEW COMPARISON:  None. FINDINGS: Endotracheal tube, NG tube, right jugular venous catheter are stable. Diffuse airspace disease is stable. No pneumothorax. No pleural effusion. IMPRESSION: Stable diffuse airspace disease. Electronically Signed   By: Marybelle Killings M.D.   On: 01/05/2016 08:57   Dg Chest Port 1 View  01/04/2016  CLINICAL DATA:  70 year old male with respiratory failure requiring intubation EXAM: PORTABLE CHEST 1 VIEW COMPARISON:  Prior chest x-ray 01/02/2016 FINDINGS: The patient is intubated. The tip of the endotracheal tube is 5.5 cm above the carina. Right IJ approach central venous catheter the tip of which overlies the mid SVC. A nasogastric tube is present. The tip of the tube is not identified but lies below the diaphragm,  presumably within the stomach. Increasing diffuse bilateral interstitial and airspace opacities in a primarily central distribution. Slightly increased fluid within the minor fissure on the right with a small layering right-sided pleural effusion. No pneumothorax. No acute osseous abnormality. IMPRESSION: 1. Increasing bilateral interstitial and airspace opacities favored to reflect worsening pulmonary edema. 2. Developing right-sided pleural effusion with small volume fluid tracking into the minor fissure. 3. Stable and satisfactory support apparatus. Electronically Signed   By: Jacqulynn Cadet M.D.   On: 01/04/2016 07:57   Dg Chest Port 1 View  01/02/2016  CLINICAL DATA:  70 year old male with respiratory failure admitted on 02/13 with shortness of breath, cough and altered mental status EXAM: PORTABLE CHEST 1 VIEW COMPARISON:  Most recent prior chest x-ray 01/01/2016; CT scan of the chest 01/01/2016 FINDINGS: The patient is intubated. The tip of the endotracheal tube is 4.3 cm above the carina. Right IJ approach central venous catheter. The tip overlies the mid SVC. No evidence of pneumothorax or other complication. Stable cardiac and mediastinal contours. Slightly increased bilateral patchy interstitial and airspace opacities in a relatively perihilar distribution. No pneumothorax. No acute osseous abnormality. IMPRESSION: 1. Interval intubation. The tip of the endotracheal tube is 4.3 cm above the carina. 2. Right IJ approach central venous catheter  with the tip overlying the mid SVC. No evidence of pneumothorax or other complicating feature. 3. Slightly increased bilateral perihilar interstitial and airspace opacities which may reflect developing noncardiogenic edema superimposed on the background pulmonary process. Alternately, this could represent progression of the underlying pulmonary process. Electronically Signed   By: Jacqulynn Cadet M.D.   On: 01/02/2016 16:44   Dg Abd Portable 1v  01/03/2016   CLINICAL DATA:  NG tube placement EXAM: PORTABLE ABDOMEN - 1 VIEW COMPARISON:  None. FINDINGS: Enteric tube tip is in the left upper quadrant consistent with location in the body of the stomach. Gas-filled nondistended colon suggesting ileus. IMPRESSION: Enteric tube tip is in the left upper quadrant consistent with location in the body of the stomach. Electronically Signed   By: Lucienne Capers M.D.   On: 01/03/2016 03:01    Assessment:   Larry Pacheco is a 70 y.o. male with a progressive severe and (by history) acute pulmonary process with no evidence of immunocompromise and cultures negative except for candida albicans and follow up culture growing light growth of a mold.  He per history had been on a trip to watch birds at [employer], has a pet turtle and has 2 cats which has been ill and have since died. Unusual case- doubt routine bacterial process so agree with stopping vanco and zosyn.   Likely viral infection, atypical zoonotic infection or non infectious process  Recommendations Continue voriconazole for now - will fu with micro lab once mold is growing better. Check serology for Q fever, psittacosis, fungal antigen (serum). I have spoken with micro and will culture sputum on special media to look for possible fastidious organisms - [place].  Start doxy 100 bid for 14 day course Question if he would benefit from biopsy. Thank you very much for allowing me to participate in the care of this patient. Please call with questions.   Cheral Marker. Ola Spurr, MD

## 2016-01-09 NOTE — Progress Notes (Signed)
[facility] Progess Note    ASSESSMENT/PLAN    ASSESSMENT / PLAN: 70 year old male no significant past medical history now with acute respiratory failure/ARDS, multifocal pneumonia of unclear etiology, cough, dyspnea, accidental fall, requiring supplemental oxygen, chest CT concerning for interstitial lung disease versus multifocal pneumonia/hypersensivity pneumonitis; with minimal improvement. Still requiring now requiring high FiO2 and high doses of sedation   A: PULMONARY VDRF Bilateral pulmonary infiltrates - favor pneumonitis or ARDS or pulmonary edema-Unable to wean due poor lung aeration on CXR despite treatment.  Hemoptysis-resolved P:  Cont vent support - settings reviewed and/or adjusted Better synchrony in PCV mode Use ARDS PEEP/FiO2 algorithm Cont vent bundle Daily SBT if/when meets criteria Daily CXR Bronchodilators IV steroids Flu A positive on bronch, will start tamiflu.   A: CARDIOVASCULAR Minimally elevated troponin - likely demand ischemia Hypertension Sinus tachycardia P:  Change metoprolol to 25 mg q8h and PRN IV dose to maintain SBP<160 MAP goal > 65 mmHg  A: RENAL No issues P: Monitor BMET intermittently Monitor I/Os Correct electrolytes as indicated  A: GASTROINTESTINAL A: Melana-Resolved Ileus  P: SUP: IV famotidine Resume tube feeds.  Monitor NGT output   A: HEMATOLOGIC Acute blood loss anemia - no active bleeding evident presently P: DVT px: SCDs Monitor CBC intermittently Transfuse per usual guidelines  INFECTIOUS A: Pneumonitis - if infectious, suspect atypical P: Monitor temp, WBC count F/u cultures Checked HIV 02/20 >> NEG D/C pip-tazo per ID-- completed course of abx.  Currently on voriconazole started 02/22 for yeast in sputum  A: ENDOCRINE A: Steroid induced hyperglycemia-no prior dx of DM P: Continue SSI - mod scale Continue Lantus 02/20   A: NEUROLOGIC ICU/vent associated  discomfort P:  RASS goal: -1, -2 PAD protocol - fent/propofol   Best Practice: Code Status: Full. Diet: NPO GI prophylaxis: PPI. VTE prophylaxis: SCD's / heparin.   ----------------------------------------   Name: Larry Pacheco MRN: [id number] DOB: [birth year]    ADMISSION DATE:  12/19/2015   SUBJECTIVE:   Pt currently on the ventilator, can not provide history or review of systems.   VITAL SIGNS: Temp:  [98.1 F (36.7 C)-99.4 F (37.4 C)] 98.1 F (36.7 C) (02/23 1600) Pulse Rate:  [65-114] 69 (02/23 1800) Resp:  [12-29] 16 (02/23 1800) BP: (74-160)/(45-98) 112/63 mmHg (02/23 1800) SpO2:  [82 %-96 %] 89 % (02/23 1800) FiO2 (%):  [75 %-85 %] 75 % (02/23 1800) Weight:  [126 lb 12.2 oz (57.5 kg)] 126 lb 12.2 oz (57.5 kg) (02/23 0500) HEMODYNAMICS:   VENTILATOR SETTINGS: Vent Mode:  [-] PCV FiO2 (%):  [75 %-85 %] 75 % Set Rate:  [14 bmp] 14 bmp PEEP:  [10 cmH20] 10 cmH20 Plateau Pressure:  [20 cmH20-21 cmH20] 20 cmH20 INTAKE / OUTPUT:  Intake/Output Summary (Last 24 hours) at 01/09/16 1958 Last data filed at 01/09/16 1800  Gross per 24 hour  Intake 3550.73 ml  Output   1785 ml  Net 1765.73 ml    PHYSICAL EXAMINATION: Physical Examination:   VS: BP 112/63 mmHg  Pulse 69  Temp(Src) 98.1 F (36.7 C) (Oral)  Resp 16  Ht 5\' 6"  (1.676 m)  Wt 126 lb 12.2 oz (57.5 kg)  BMI 20.47 kg/m2  SpO2 89%  General Appearance: No distress  Neuro:without focal findings, mental status normal. HEENT: PERRLA, EOM intact. Pulmonary: normal breath sounds   CardiovascularNormal S1,S2.  No m/r/g.   Abdomen: Benign, Soft, non-tender. Renal:  No costovertebral tenderness  GU:  Not performed at this time. Endocrine:  No evident thyromegaly. Skin:   warm, no rashes, no ecchymosis  Extremities: normal, no cyanosis, clubbing.   LABS:   LABORATORY PANEL:   CBC  Recent Labs Lab 01/09/16 0517  WBC 32.7*  HGB 8.4*  HCT 24.8*  PLT 710*    Chemistries   Recent  Labs Lab 01/03/16 0608  01/09/16 0517  NA 139  < > 144  K 3.0*  < > 4.0  CL 101  < > 104  CO2 30  < > 34*  GLUCOSE 190*  < > 163*  BUN 29*  < > 30*  CREATININE 1.24  < > 0.89  CALCIUM 7.1*  < > 7.7*  MG 2.5*  < > 2.0  PHOS  --   < > 2.3*  AST 122*  --   --   ALT 101*  --   --   ALKPHOS 47  --   --   BILITOT 1.2  --   --   < > = values in this interval not displayed.   Recent Labs Lab 01/08/16 2014 01/09/16 0103 01/09/16 0401 01/09/16 0823 01/09/16 1202 01/09/16 1659  GLUCAP 145* 143* 147* 171* 188* 179*    Recent Labs Lab 01/05/16 0450 01/06/16 0430 01/08/16 0446  PHART 7.44 7.49* 7.50*  PCO2ART 46 43 48  PO2ART 50* 56* 63*    Recent Labs Lab 01/03/16 0608  AST 122*  ALT 101*  ALKPHOS 47  BILITOT 1.2  ALBUMIN 2.1*    Cardiac Enzymes No results for input(s): TROPONINI in the last 168 hours.  RADIOLOGY:  Dg Chest 1 View  01/08/2016  CLINICAL DATA:  Sepsis, community-acquired pneumonia, respiratory failure with intubation. EXAM: CHEST 1 VIEW COMPARISON:  Portable chest x-ray of January 07, 2016 FINDINGS: The lungs are adequately inflated. Confluent interstitial and alveolar opacities persist bilaterally. There is no large pleural effusion. There is no pneumothorax. The heart is normal in size. The pulmonary vascularity is indistinct. The endotracheal tube tip lies 4.8 cm above the carina. The esophagogastric tube tip projects below the inferior margin of the image. The right internal jugular venous catheter tip projects over the midportion of the SVC. IMPRESSION: Stable interstitial and alveolar opacities consistent with pneumonia, less likely pulmonary edema. The support tubes are in reasonable position. Electronically Signed   By: David  [place] M.D.   On: 01/08/2016 07:14   Ct Chest W Contrast  01/08/2016  CLINICAL DATA:  Hemoptysis. Shortness breath. Cough. Acute respiratory failure. Community acquired pneumonia and sepsis. EXAM: CT CHEST WITH CONTRAST  TECHNIQUE: Multidetector CT imaging of the chest was performed during intravenous contrast administration. CONTRAST:  75mL OMNIPAQUE IOHEXOL 300 MG/ML  SOLN COMPARISON:  None. FINDINGS: Mediastinum/Lymph Nodes: Small mediastinal lymph nodes are seen in the AP window, largest measuring 12 mm on image 23. No other pathologically enlarged lymph nodes identified. Endotracheal tube and nasogastric tube seen in place. Lungs/Pleura: Diffuse bilateral pulmonary airspace disease is seen which may be due to diffuse pulmonary edema, ARDS, or pneumonia. Small pleural effusions seen bilaterally. No definite pulmonary mass identified. Upper abdomen: No acute findings. Musculoskeletal: No chest wall mass or suspicious bone lesions identified. IMPRESSION: Diffuse bilateral pulmonary airspace disease and small bilateral pleural effusions. Differential diagnosis includes diffuse pulmonary edema, ARDS, or pneumonia. Mild mediastinal lymphadenopathy in the AP window, likely reactive in etiology. Consider continued attention on follow-up CT. Electronically Signed   By: Myles Rosenthal M.D.   On: 01/08/2016 17:52   Dg Chest Port 1 View  01/09/2016  CLINICAL DATA:  Acute respiratory failure EXAM: PORTABLE CHEST 1 VIEW COMPARISON:  01/08/2016 FINDINGS: Endotracheal tube in good position. Right jugular catheter tip in the lower SVC unchanged. No pneumothorax Diffuse bilateral airspace disease shows mild interval improvement. No significant effusion. IMPRESSION: Endotracheal tube remains in good position Diffuse bilateral airspace disease with mild improvement Electronically Signed   By: Marlan Palau M.D.   On: 01/09/2016 06:42       --Wells Guiles, MD.  [facility]   Santiago Glad, M.D.  Stephanie Acre, M.D.  Billy Fischer, M.D Critical Care Attestation.  I have personally obtained a history, examined the patient, evaluated laboratory and imaging results, formulated the assessment and plan and placed  orders. The Patient requires high complexity decision making for assessment and support, frequent evaluation and titration of therapies, application of advanced monitoring technologies and extensive interpretation of multiple databases. The patient has critical illness that could lead imminently to failure of 1 or more organ systems and requires the highest level of physician preparedness to intervene.  Critical Care Time devoted to patient care services described in this note is 35 minutes and is exclusive of time spent in procedures.

## 2016-01-09 NOTE — Progress Notes (Signed)
RT to room to obtain sputum specimen and decrease FIO2 to 75%.  Sat 98% on 85% FIO2 prior to change.  Will continue to monitor.

## 2016-01-09 NOTE — Progress Notes (Signed)
Nutrition Follow-up    INTERVENTION:   EN: with current diprivan,  recommend continuing current TF regimen, continue to assess   NUTRITION DIAGNOSIS:   Inadequate oral intake related to acute illness as evidenced by meal completion < 50%.  GOAL:   Patient will meet greater than or equal to 90% of their needs  MONITOR:    (Energy Intake, Anthropometrics, Digestive System)  REASON FOR ASSESSMENT:   Consult Enteral/tube feeding initiation and management  ASSESSMENT:     Pt remains on vent  Diet Order:  Diet NPO time specified   EN: tolerating Vital 1.2 AF at rate of 40 ml/hr, Prostat daily  Digestive System: +BM 2/22, no signs of TF intolerance   Recent Labs Lab 01/05/16 0426 01/06/16 0457 01/07/16 0441 01/08/16 0425 01/09/16 0517  NA 137 140 142 142 144  K 2.9* 4.0 4.4 4.1 4.0  CL 100* 104 104 100* 104  CO2 30 32 35* 37* 34*  BUN 34* 26* 27* 26* 30*  CREATININE 1.07 0.87 0.91 0.89 0.89  CALCIUM 7.0* 7.0* 7.3* 7.6* 7.7*  MG 2.2 2.1  --   --  2.0  PHOS 2.1* 1.9*  --   --  2.3*  GLUCOSE 249* 141* 190* 189* 163*    Glucose Profile:  Recent Labs  01/09/16 0401 01/09/16 0823 01/09/16 1202  GLUCAP 147* 171* 188*   Meds: ss novolog, lantus, solumedrol, diprivan 252 kcals  Skin:  Reviewed, no issues  Height:   Ht Readings from Last 1 Encounters:  01/08/2016  (1.676 m)    Weight:   Wt Readings from Last 1 Encounters:  01/09/16 126 lb 12.2 oz (57.5 kg)    Filed Weights   01/07/16 0405 01/08/16 0500 01/09/16 0500  Weight: 133 lb 2.5 oz (60.4 kg) 127 lb 10.3 oz (57.9 kg) 126 lb 12.2 oz (57.5 kg)    BMI:  Body mass index is 20.47 kg/(m^2).  Estimated Nutritional Needs:   Kcal:  1468 kcals (BEE 1243, Ve: 5.1, Tmax: 37.9) using wt of 55 kg  Protein:  83-110 g (1.5-2.0 g/kg)   Fluid:  1375-1650 mL (25-30 ml/kg)   HIGH Care Level  Romelle Starcher MS, RD, LDN [phone] Pager  [phone] Weekend/On-Call Pager

## 2016-01-10 ENCOUNTER — Inpatient Hospital Stay: Payer: Medicare Other

## 2016-01-10 LAB — CBC
HEMATOCRIT: 24.3 % — AB (ref 40.0–52.0)
HEMOGLOBIN: 8.2 g/dL — AB (ref 13.0–18.0)
MCH: 30 pg (ref 26.0–34.0)
MCHC: 33.7 g/dL (ref 32.0–36.0)
MCV: 89 fL (ref 80.0–100.0)
Platelets: 685 10*3/uL — ABNORMAL HIGH (ref 150–440)
RBC: 2.73 MIL/uL — AB (ref 4.40–5.90)
RDW: 14.3 % (ref 11.5–14.5)
WBC: 33.7 10*3/uL — ABNORMAL HIGH (ref 3.8–10.6)

## 2016-01-10 LAB — BLOOD GAS, ARTERIAL
Acid-Base Excess: 8.6 mmol/L — ABNORMAL HIGH (ref 0.0–3.0)
Allens test (pass/fail): POSITIVE — AB
Bicarbonate: 33.5 mEq/L — ABNORMAL HIGH (ref 21.0–28.0)
FIO2: 0.75
LHR: 14 {breaths}/min
Mechanical Rate: 14
O2 SAT: 82 %
PATIENT TEMPERATURE: 37
PCO2 ART: 46 mmHg (ref 32.0–48.0)
PEEP: 10 cmH2O
PH ART: 7.47 — AB (ref 7.350–7.450)
PO2 ART: 43 mmHg — AB (ref 83.0–108.0)
Pressure control: 20 cmH2O

## 2016-01-10 LAB — BASIC METABOLIC PANEL
Anion gap: 6 (ref 5–15)
BUN: 28 mg/dL — AB (ref 6–20)
CHLORIDE: 105 mmol/L (ref 101–111)
CO2: 32 mmol/L (ref 22–32)
Calcium: 7.5 mg/dL — ABNORMAL LOW (ref 8.9–10.3)
Creatinine, Ser: 0.9 mg/dL (ref 0.61–1.24)
GFR calc Af Amer: >60 mL/min (ref >60)
GFR calc non Af Amer: >60 mL/min (ref >60)
GLUCOSE: 154 mg/dL — AB (ref 65–99)
POTASSIUM: 3.7 mmol/L (ref 3.5–5.1)
Sodium: 143 mmol/L (ref 135–145)

## 2016-01-10 LAB — GLUCOSE, CAPILLARY
GLUCOSE-CAPILLARY: 100 mg/dL — AB (ref 65–99)
GLUCOSE-CAPILLARY: 129 mg/dL — AB (ref 65–99)
GLUCOSE-CAPILLARY: 179 mg/dL — AB (ref 65–99)
GLUCOSE-CAPILLARY: 98 mg/dL (ref 65–99)
Glucose-Capillary: 161 mg/dL — ABNORMAL HIGH (ref 65–99)
Glucose-Capillary: 178 mg/dL — ABNORMAL HIGH (ref 65–99)

## 2016-01-10 LAB — MISC LABCORP TEST (SEND OUT)
LABCORP TEST CODE: 138006
LabCorp test name: 16774

## 2016-01-10 LAB — VIRUS CULTURE

## 2016-01-10 LAB — TRIGLYCERIDES: Triglycerides: 169 mg/dL — ABNORMAL HIGH (ref <150)

## 2016-01-10 MED ORDER — OSELTAMIVIR PHOSPHATE 75 MG PO CAPS
75.0000 mg | ORAL_CAPSULE | Freq: Two times a day (BID) | ORAL | Status: DC
  Administered 2016-01-10 (×2): 75 mg via ORAL
  Filled 2016-01-10 (×5): qty 1

## 2016-01-10 MED ORDER — POLYETHYLENE GLYCOL 3350 17 G PO PACK: 17.0000 g | PACK | Freq: Every day | ORAL | Status: DC | PRN

## 2016-01-10 NOTE — Care Management (Signed)
Spoke with patient's sister Katheren Puller regarding ltac. She is in agreement to consider.  CM will contact her on Monday.  Ms Zenaida Niece most likely will wish to speak with representative from both [facility] and [facility] ad she is not familiar with either facility.

## 2016-01-10 NOTE — Progress Notes (Signed)
[facility] [place] [facility]   PATIENT NAME: Larry Pacheco    MR#:  [id number]  DATE OF BIRTH:  02-03-1946  SUBJECTIVE:  Unable to obtain-patient on vent  REVIEW OF SYSTEMS:  nable to obtain given patient's mental status medical condition  DRUG ALLERGIES:  No Known Allergies  VITALS:  Blood pressure 95/58, pulse 78, temperature 98.3 F (36.8 C), temperature source Oral, resp. rate 18, height  (1.676 m), weight 57.1 kg (125 lb 14.1 oz), SpO2 92 %.  PHYSICAL EXAMINATION:   VITAL SIGNS: Filed Vitals:   01/10/16 1132 01/10/16 1200  BP: 104/60 95/58  Pulse:  78  Temp:  98.3 F (36.8 C)  Resp:  18   GENERAL:70 y.o.male critically ill intubated mechanical ventilation HEAD: Normocephalic, atraumatic.  EYES: Pupils equal, round, reactive to light. Unable to assess extraocular muscles given mental status/medical condition. No scleral icterus.  MOUTH: Moist mucosal membrane. Dentition intact. No abscess noted.  EAR, NOSE, THROAT: Clear without exudates. No external lesions.  NECK: Supple. No thyromegaly. No nodules. No JVD.  PULMONARY: diffuse coarse breath sounds without wheeze rails or rhonci. No use of accessory muscles, mechanical venlation CHEST: Nontender to palpation.  CARDIOVASCULAR: S1 and S2. Regular rate and rhythm. No murmurs, rubs, or gallops. No edema. Pedal pulses 2+ bilaterally.  GASTROINTESTINAL: Soft, nontender, nondistended. No masses. Positive bowel sounds. No hepatosplenomegaly.  MUSCULOSKELETAL: No swelling, clubbing, or edema. Passive Range of motion full in all extremities.  NEUROLOGIC: Unable to assess given mental status/medical condition SKIN: No ulceration, lesions, rashes, or cyanosis. Skin warm and dry. Turgor intact.  PSYCHIATRIC: Unable to assess given mental status/medical condition       LABORATORY PANEL:   CBC  Recent Labs Lab 01/10/16 0454  WBC 33.7*  HGB 8.2*  HCT 24.3*  PLT 685*    ------------------------------------------------------------------------------------------------------------------  Chemistries   Recent Labs Lab 01/09/16 0517 01/10/16 0454  NA 144 143  K 4.0 3.7  CL 104 105  CO2 34* 32  GLUCOSE 163* 154*  BUN 30* 28*  CREATININE 0.89 0.90  CALCIUM 7.7* 7.5*  MG 2.0  --    ------------------------------------------------------------------------------------------------------------------  Cardiac Enzymes No results for input(s): TROPONINI in the last 168 hours. ------------------------------------------------------------------------------------------------------------------  RADIOLOGY:  Dg Chest 1 View  01/10/2016  CLINICAL DATA:  Shortness of breath . EXAM: CHEST 1 VIEW COMPARISON:  01/09/2016.  CT 01/08/2016. FINDINGS: Endotracheal tube, NG tube, right IJ line in stable position. Heart size normal. Diffuse bilateral pulmonary infiltrates again noted. No interim change. No pleural effusion or pneumothorax. IMPRESSION: 1. Lines and tubes in stable position. 2. Diffuse bilateral airspace disease with no interim change from prior exam. Electronically Signed   By: Maisie Fus  Register   On: 01/10/2016 07:11   Ct Chest W Contrast  01/08/2016  CLINICAL DATA:  Hemoptysis. Shortness breath. Cough. Acute respiratory failure. Community acquired pneumonia and sepsis. EXAM: CT CHEST WITH CONTRAST TECHNIQUE: Multidetector CT imaging of the chest was performed during intravenous contrast administration. CONTRAST:  75mL OMNIPAQUE IOHEXOL 300 MG/ML  SOLN COMPARISON:  None. FINDINGS: Mediastinum/Lymph Nodes: Small mediastinal lymph nodes are seen in the AP window, largest measuring 12 mm on image 23. No other pathologically enlarged lymph nodes identified. Endotracheal tube and nasogastric tube seen in place. Lungs/Pleura: Diffuse bilateral pulmonary airspace disease is seen which may be due to diffuse pulmonary edema, ARDS, or pneumonia. Small pleural effusions seen  bilaterally. No definite pulmonary mass identified. Upper abdomen: No acute findings. Musculoskeletal: No chest wall  mass or suspicious bone lesions identified. IMPRESSION: Diffuse bilateral pulmonary airspace disease and small bilateral pleural effusions. Differential diagnosis includes diffuse pulmonary edema, ARDS, or pneumonia. Mild mediastinal lymphadenopathy in the AP window, likely reactive in etiology. Consider continued attention on follow-up CT. Electronically Signed   By: Myles Rosenthal M.D.   On: 01/08/2016 17:52   Dg Chest Port 1 View  01/09/2016  CLINICAL DATA:  Acute respiratory failure EXAM: PORTABLE CHEST 1 VIEW COMPARISON:  01/08/2016 FINDINGS: Endotracheal tube in good position. Right jugular catheter tip in the lower SVC unchanged. No pneumothorax Diffuse bilateral airspace disease shows mild interval improvement. No significant effusion. IMPRESSION: Endotracheal tube remains in good position Diffuse bilateral airspace disease with mild improvement Electronically Signed   By: Marlan Palau M.D.   On: 01/09/2016 06:42    EKG:   Orders placed or performed during the hospital encounter of 01/27/16  . EKG 12-Lead  . EKG 12-Lead  . EKG 12-Lead  . EKG 12-Lead  . ED EKG 12-Lead  . ED EKG 12-Lead  . ED EKG  . ED EKG  . EKG 12-Lead  . EKG 12-Lead  . EKG 12-Lead  . EKG 12-Lead    ASSESSMENT AND PLAN:   70 year old Caucasian gentleman admitted 2016-01-27  1. sepsis with pneumonia bilaterally increasing leukocytosis. Patient finished Zosyn and Zithromax  Patient placed on voriconazole. White blood cell count still very elevated. Patient empirically put on doxycycline also. - infectious disease  2. Acute respiratory failure with hypoxia. Secondary to rapidly progressing pneumonia. Bronchoscopy showing yeast. Patient on 85% FiO2. anca negative. full vent support wean as tolerated 3. GI bleed with drop in hemoglobin. Today's hemoglobin is higher at 8.9. Patient is on  Protonix. 4. Nutrition - continue tube feeds.  5. Essential hypertension, tachycardia- patient started on metoprolol and Cardizem. Blood pressure and heart rate very labile 6. Hypokalemia, hypophosphatemia- replaced  Disposition: likely LTAC    All the records are reviewed and case discussed with Care Management/Social Workerr. Management plans discussed with the patient, family and they are in agreement.  CODE STATUS: full  TOTAL TIME TAKING CARE OF THIS PATIENT: 33 minutes.   POSSIBLE D/C IN 2-3 DAYS, DEPENDING ON CLINICAL CONDITION.   Reathel Turi,  Mardi Mainland.D on 01/10/2016 at 2:26 PM  Between 7am to 6pm - Pager - [phone]  After 6pm: House Pager: - [phone]  [facility]  Office  [phone]  CC: Primary care physician; No primary care provider on file.

## 2016-01-10 NOTE — Progress Notes (Signed)
Pharmacy Antibiotic Note  Larry Pacheco is a 70 y.o. male admitted on 12/19/2015 with pneumonia and sepsis.  Pharmacy consulted for Voriconazole dosing - Day 2.  Plan: Will continue patient on voriconazole  IV Q12hr.    Height:  (167.6 cm) Weight: 125 lb 14.1 oz (57.1 kg) IBW/kg (Calculated) : 63.8  Temp (24hrs), Avg:98.5 F (36.9 C), Min:98.1 F (36.7 C), Max:99.8 F (37.7 C)   Recent Labs Lab 01/06/16 0457 01/07/16 0441 01/08/16 0425 01/09/16 0517 01/10/16 0454  WBC 22.4* 26.9* 32.8* 32.7* 33.7*  CREATININE 0.87 0.91 0.89 0.89 0.90    Estimated Creatinine Clearance: 60.8 mL/min (by C-G formula based on Cr of 0.9).    No Known Allergies   Antibiotics:  Ceftriaxone 2/13 >>2/14 Azithromycin 2/13 >>2/14, 2/16>>2/18  Vanc 2/15>>2/15 Zosyn 2/15>>2/22 Voriconazole 2/22 Doxycycline 2/23  Microbiology:  02/13 Flu: negative 02/15 MRSA PCR: negative 02/13 Urine cx: negative 02/15 GI panel: negative 02/15 C diff: negative 02/15 Sputum cx: pending 02/15 GPC in 1/4 bottles 02/16 Bronch Wash: Heavy Growth Candida Albicans  02/20 Respiratory Culture: Heavy Growth Yeast/Mold sent  Pharmacy will continue to monitor and adjust per consult.  Valentina Gu 01/10/2016 3:54 PM

## 2016-01-10 NOTE — Progress Notes (Signed)
Pt remains intubated with FiO2 at 80% PEEP 12 O2 sats ranging between 87 to 91%; pt follows commands when sedation delayed; no s/s of pain present; NSR on cardiac monitor; adequate uop via foley; pt currently on droplet precautions; pts sister updated during shift and questions answered will continue to monitor and assess pt

## 2016-01-10 NOTE — Progress Notes (Signed)
[facility] CLINIC INFECTIOUS DISEASE PROGRESS NOTE Date of Admission:  19-Jan-2016     ID: Larry Pacheco is a 70 y.o. male with severe PNA and ARDS Active Problems:   Pneumonia   Community acquired pneumonia   Hemoptysis   Sepsis (HCC)  Subjective: Remains intubated, viral cx + flu  ROS  Eleven systems are reviewed and negative except per hpi  Medications:  Antibiotics Given (last 72 hours)    Date/Time Action Medication Dose Rate   01/08/16 0419 Given   piperacillin-tazobactam (ZOSYN) IVPB 3.375 g 3.375 g 12.5 mL/hr   01/09/16 1426 Given   doxycycline (VIBRAMYCIN) 100 mg in dextrose 5 % 250 mL IVPB 100 mg 125 mL/hr   01/10/16 0248 Given   doxycycline (VIBRAMYCIN) 100 mg in dextrose 5 % 250 mL IVPB 100 mg 125 mL/hr   01/10/16 1528 Given   doxycycline (VIBRAMYCIN) 100 mg in dextrose 5 % 250 mL IVPB 100 mg 125 mL/hr   01/10/16 1631 Given  [med dispensing delay]   oseltamivir (TAMIFLU) capsule 75 mg 75 mg      . antiseptic oral rinse  7 mL Mouth Rinse QID  . chlorhexidine gluconate  15 mL Mouth Rinse BID  . diltiazem  30 mg Oral 4 times per day  . doxycycline (VIBRAMYCIN) IV  100 mg Intravenous Q12H  . famotidine  20 mg Per Tube BID  . feeding supplement (PRO-STAT SUGAR FREE 64)  30 mL Per Tube Daily  . free water  200 mL Per Tube 3 times per day  . heparin subcutaneous  5,000 Units Subcutaneous Q12H  . insulin aspart  0-15 Units Subcutaneous 6 times per day  . insulin glargine  10 Units Subcutaneous Daily  . methylPREDNISolone (SOLU-MEDROL) injection  40 mg Intravenous Q24H  . metoprolol tartrate  25 mg Per Tube Q8H  . oseltamivir  75 mg Oral BID  . senna-docusate  2 tablet Oral BID  . sodium chloride flush  10-40 mL Intracatheter Q12H  . voriconazole  4 mg/kg Intravenous Q12H    Objective: Vital signs in last 24 hours: Temp:  [97.9 F (36.6 C)-99.8 F (37.7 C)] 97.9 F (36.6 C) (02/24 1639) Pulse Rate:  [72-122] 91 (02/24 1800) Resp:  [11-34] 14 (02/24 1800) BP:  (87-162)/(52-99) 119/76 mmHg (02/24 1800) SpO2:  [86 %-96 %] 88 % (02/24 1800) FiO2 (%):  [70 %-80 %] 80 % (02/24 1554) Weight:  [57.1 kg (125 lb 14.1 oz)] 57.1 kg (125 lb 14.1 oz) (02/24 0516)  Constitutional: thin, intubated,  HENT: Perrla, no icterus Mouth/Throat: ETT in place Cardiovascular: Tachy, reg Pulmonary/Chest: Mech BS Abdominal: Soft. Bowel sounds are normal. He exhibits no distension. There is no tenderness.  Lymphadenopathy: He has no cervical adenopathy.  Neurological: opens eyes.  Skin: Skin is warm and dry. No rash noted. No erythema.  Psychiatric: intubated Lab Results  Recent Labs  01/09/16 0517 01/10/16 0454  WBC 32.7* 33.7*  HGB 8.4* 8.2*  HCT 24.8* 24.3*  NA 144 143  K 4.0 3.7  CL 104 105  CO2 34* 32  BUN 30* 28*  CREATININE 0.89 0.90    Microbiology: Results for orders placed or performed during the hospital encounter of 01-19-16  Rapid Influenza A&B Antigens ([facility] only)     Status: None   Collection Time: 01/19/16  3:01 PM  Result Value Ref Range Status   Influenza A ([facility]) NEGATIVE  Final   Influenza B ([facility]) NEGATIVE  Final  Blood Culture (routine x 2)  Status: None   Collection Time: 01/13/2016  3:55 PM  Result Value Ref Range Status   Specimen Description BLOOD RIGHT ASSIST CONTROL  Final   Special Requests   Final    BOTTLES DRAWN AEROBIC AND ANAEROBIC  6CC AERO, 5CC ANA   Culture  Setup Time   Final    GRAM POSITIVE COCCI ANAEROBIC BOTTLE ONLY CRITICAL RESULT CALLED TO, READ BACK BY AND VERIFIED WITH: Ihor Austin 01/02/16 1250PM MLM Organism ID to follow    Culture   Final    STAPHYLOCOCCUS SACCHAROLYTICUS ANAEROBIC BOTTLE ONLY Results consistent with contamination.    Report Status 01/05/2016 FINAL  Final  Blood Culture ID Panel (Reflexed)     Status: None   Collection Time: 01/11/2016  3:55 PM  Result Value Ref Range Status   Enterococcus species NOT DETECTED NOT DETECTED Final   Listeria monocytogenes NOT DETECTED NOT  DETECTED Final   Staphylococcus species NOT DETECTED NOT DETECTED Final   Staphylococcus aureus NOT DETECTED NOT DETECTED Final   Streptococcus species NOT DETECTED NOT DETECTED Final   Streptococcus agalactiae NOT DETECTED NOT DETECTED Final   Streptococcus pneumoniae NOT DETECTED NOT DETECTED Final   Streptococcus pyogenes NOT DETECTED NOT DETECTED Final   Acinetobacter baumannii NOT DETECTED NOT DETECTED Final   Enterobacteriaceae species NOT DETECTED NOT DETECTED Final   Enterobacter cloacae complex NOT DETECTED NOT DETECTED Final   Escherichia coli NOT DETECTED NOT DETECTED Final   Klebsiella oxytoca NOT DETECTED NOT DETECTED Final   Klebsiella pneumoniae NOT DETECTED NOT DETECTED Final   Proteus species NOT DETECTED NOT DETECTED Final   Serratia marcescens NOT DETECTED NOT DETECTED Final   Haemophilus influenzae NOT DETECTED NOT DETECTED Final   Neisseria meningitidis NOT DETECTED NOT DETECTED Final   Pseudomonas aeruginosa NOT DETECTED NOT DETECTED Final   Candida albicans NOT DETECTED NOT DETECTED Final   Candida glabrata NOT DETECTED NOT DETECTED Final   Candida krusei NOT DETECTED NOT DETECTED Final   Candida parapsilosis NOT DETECTED NOT DETECTED Final   Candida tropicalis NOT DETECTED NOT DETECTED Final   Carbapenem resistance NOT DETECTED NOT DETECTED Final   Methicillin resistance NOT DETECTED NOT DETECTED Final   Vancomycin resistance NOT DETECTED NOT DETECTED Final  Blood Culture (routine x 2)     Status: None   Collection Time: 01/13/2016  4:01 PM  Result Value Ref Range Status   Specimen Description BLOOD LEFT ASSIST CONTROL  Final   Special Requests   Final    BOTTLES DRAWN AEROBIC AND ANAEROBIC  3CC AERO, 2CC ANA   Culture NO GROWTH 5 DAYS  Final   Report Status 01/04/2016 FINAL  Final  Urine culture     Status: None   Collection Time: 01/05/2016  6:36 PM  Result Value Ref Range Status   Specimen Description URINE, RANDOM  Final   Special Requests NONE  Final    Culture NO GROWTH 1 DAY  Final   Report Status 01/01/2016 FINAL  Final  MRSA PCR Screening     Status: None   Collection Time: 01/01/16  2:08 AM  Result Value Ref Range Status   MRSA by PCR NEGATIVE NEGATIVE Final    Comment:        The GeneXpert MRSA Assay (FDA approved for NASAL specimens only), is one component of a comprehensive MRSA colonization surveillance program. It is not intended to diagnose MRSA infection nor to guide or monitor treatment for MRSA infections.   Gastrointestinal Panel by PCR , Stool  Status: None   Collection Time: 01/01/16  6:59 AM  Result Value Ref Range Status   Campylobacter species NOT DETECTED NOT DETECTED Final   Plesimonas shigelloides NOT DETECTED NOT DETECTED Final   Salmonella species NOT DETECTED NOT DETECTED Final   Yersinia enterocolitica NOT DETECTED NOT DETECTED Final   Vibrio species NOT DETECTED NOT DETECTED Final   Vibrio cholerae NOT DETECTED NOT DETECTED Final   Enteroaggregative E coli (EAEC) NOT DETECTED NOT DETECTED Final   Enteropathogenic E coli (EPEC) NOT DETECTED NOT DETECTED Final   Enterotoxigenic E coli (ETEC) NOT DETECTED NOT DETECTED Final   Shiga like toxin producing E coli (STEC) NOT DETECTED NOT DETECTED Final   E. coli O157 NOT DETECTED NOT DETECTED Final   Shigella/Enteroinvasive E coli (EIEC) NOT DETECTED NOT DETECTED Final   Cryptosporidium NOT DETECTED NOT DETECTED Final   Cyclospora cayetanensis NOT DETECTED NOT DETECTED Final   Entamoeba histolytica NOT DETECTED NOT DETECTED Final   Giardia lamblia NOT DETECTED NOT DETECTED Final   Adenovirus F40/41 NOT DETECTED NOT DETECTED Final   Astrovirus NOT DETECTED NOT DETECTED Final   Norovirus GI/GII NOT DETECTED NOT DETECTED Final   Rotavirus A NOT DETECTED NOT DETECTED Final   Sapovirus (I, II, IV, and V) NOT DETECTED NOT DETECTED Final  C difficile quick scan w PCR reflex     Status: None   Collection Time: 01/01/16  6:59 AM  Result Value Ref Range Status    C Diff antigen NEGATIVE NEGATIVE Final   C Diff toxin NEGATIVE NEGATIVE Final   C Diff interpretation Negative for C. difficile  Final  Culture, expectorated sputum-assessment     Status: None   Collection Time: 01/01/16 10:46 AM  Result Value Ref Range Status   Specimen Description SPUTUM  Final   Special Requests Normal  Final   Sputum evaluation THIS SPECIMEN IS ACCEPTABLE FOR SPUTUM CULTURE  Final   Report Status 01/01/2016 FINAL  Final  Culture, respiratory (NON-Expectorated)     Status: None   Collection Time: 01/01/16 10:46 AM  Result Value Ref Range Status   Specimen Description SPUTUM  Final   Special Requests Normal Reflexed from [id number]  Final   Gram Stain   Final    FAIR SPECIMEN - 70-80% WBCS FEW SQUAMOUS EPITHELIAL CELLS PRESENT MODERATE WBC SEEN MODERATE YEAST FEW GRAM NEGATIVE RODS RARE GRAM POSITIVE COCCI IN PAIRS    Culture LIGHT GROWTH CANDIDA ALBICANS  Final   Report Status 01/04/2016 FINAL  Final  Fungus Culture with Smear     Status: None (Preliminary result)   Collection Time: 01/02/16  2:52 PM  Result Value Ref Range Status   Specimen Description BRONCHIAL WASHINGS  Final   Special Requests Normal  Final   Culture CANDIDA ALBICANS  Final   Report Status PENDING  Incomplete  Culture, bal-quantitative     Status: None   Collection Time: 01/02/16  2:52 PM  Result Value Ref Range Status   Specimen Description BRONCHIAL WASHINGS  Final   Special Requests Normal  Final   Gram Stain   Final    FAIR SPECIMEN - 70-80% WBCS FEW SQUAMOUS EPITHELIAL CELLS PRESENT MODERATE WBC SEEN MANY YEAST    Culture HEAVY GROWTH CANDIDA ALBICANS  Final   Report Status 01/06/2016 FINAL  Final  Virus culture     Status: Abnormal   Collection Time: 01/02/16  2:52 PM  Result Value Ref Range Status   Viral Culture Comment (A)  Final    Comment: (  NOTE) Positive Influenza A detected. Performed At: [facility] [address] [place], [place] [id number] Mila Homer MD WU:[id number]    Source of Sample BRONCHIAL WASHINGS  Final  Culture, blood (Routine X 2) w Reflex to ID Panel     Status: None (Preliminary result)   Collection Time: 01/06/16 10:42 AM  Result Value Ref Range Status   Specimen Description BLOOD RIGHT ASSIST CONTROL  Final   Special Requests BOTTLES DRAWN AEROBIC AND ANAEROBIC 7CCAERO,7CCANA  Final   Culture NO GROWTH 4 DAYS  Final   Report Status PENDING  Incomplete  Culture, blood (Routine X 2) w Reflex to ID Panel     Status: None (Preliminary result)   Collection Time: 01/06/16 10:51 AM  Result Value Ref Range Status   Specimen Description BLOOD LEFT ASSIST CONTROL  Final   Special Requests BOTTLES DRAWN AEROBIC AND ANAEROBIC 5CCAERO,5CCANA  Final   Culture NO GROWTH 4 DAYS  Final   Report Status PENDING  Incomplete  Culture, respiratory (NON-Expectorated)     Status: None (Preliminary result)   Collection Time: 01/06/16 12:08 PM  Result Value Ref Range Status   Specimen Description TRACHEAL ASPIRATE  Final   Special Requests NONE  Final   Gram Stain   Final    FEW WBC SEEN MANY YEAST GOOD SPECIMEN - 80-90% WBCS    Culture   Final    HEAVY GROWTH CANDIDA ALBICANS MOLD SENT TO STATE FOR IDENTIFICATION    Report Status PENDING  Incomplete  Culture, expectorated sputum-assessment     Status: None   Collection Time: 01/09/16  3:09 PM  Result Value Ref Range Status   Specimen Description TRACHEAL ASPIRATE  Final   Special Requests Normal  Final   Sputum evaluation THIS SPECIMEN IS ACCEPTABLE FOR SPUTUM CULTURE  Final   Report Status 01/09/2016 FINAL  Final  Culture, respiratory (NON-Expectorated)     Status: None (Preliminary result)   Collection Time: 01/09/16  3:09 PM  Result Value Ref Range Status   Specimen Description TRACHEAL ASPIRATE  Final   Special Requests Normal Reflexed from [id number]  Final   Gram Stain TOO YOUNG TO READ  Final   Culture PENDING  Incomplete   Report Status PENDING  Incomplete     Studies/Results: Dg Chest 1 View  01/10/2016  CLINICAL DATA:  Shortness of breath . EXAM: CHEST 1 VIEW COMPARISON:  01/09/2016.  CT 01/08/2016. FINDINGS: Endotracheal tube, NG tube, right IJ line in stable position. Heart size normal. Diffuse bilateral pulmonary infiltrates again noted. No interim change. No pleural effusion or pneumothorax. IMPRESSION: 1. Lines and tubes in stable position. 2. Diffuse bilateral airspace disease with no interim change from prior exam. Electronically Signed   By: Maisie Fus  Register   On: 01/10/2016 07:11   Dg Chest Port 1 View  01/09/2016  CLINICAL DATA:  Acute respiratory failure EXAM: PORTABLE CHEST 1 VIEW COMPARISON:  01/08/2016 FINDINGS: Endotracheal tube in good position. Right jugular catheter tip in the lower SVC unchanged. No pneumothorax Diffuse bilateral airspace disease shows mild interval improvement. No significant effusion. IMPRESSION: Endotracheal tube remains in good position Diffuse bilateral airspace disease with mild improvement Electronically Signed   By: Marlan Palau M.D.   On: 01/09/2016 06:42    Assessment/Plan: Larry Pacheco is a 70 y.o. male with a progressive severe and (by history) acute pulmonary process with no evidence of immunocompromise and cultures negative except for candida albicans and follow up culture growing light growth of a mold.  He per history had been on a trip to watch birds at [employer], has a pet turtle and has 2 cats which has been ill and have since died. BAL culture now growing influenza A Recommendations Can dc voriconazole and doxycycline Continue tamiflu Thank you very much for the consult.  I will sign off now, please call with questions .Theora Vankirk   01/10/2016, 8:21 PM

## 2016-01-10 NOTE — Progress Notes (Signed)
Nutrition Follow-up    INTERVENTION:   EN: with current diprivan, recommend continuing current TF regimen; continue to assess   NUTRITION DIAGNOSIS:   Inadequate oral intake related to acute illness as evidenced by meal completion < 50%. Being addressed via TF while on vent  GOAL:   Patient will meet greater than or equal to 90% of their needs  MONITOR:    (Energy Intake, Anthropometrics, Digestive System)  REASON FOR ASSESSMENT:   Consult Enteral/tube feeding initiation and management  ASSESSMENT:    Pt remains on vent  Diet Order:  Diet NPO time specified   EN: tolerating Vital AF 1.2 at rate of 40 ml/hr, Prostat daily, 200 mL free water q 8 hours  Skin:  Reviewed, no issues  Digestive System: abdomen soft, BS hypoactive, +loose/watery stool   Recent Labs Lab 01/05/16 0426 01/06/16 0457  01/08/16 0425 01/09/16 0517 01/10/16 0454  NA 137 140  < > 142 144 143  K 2.9* 4.0  < > 4.1 4.0 3.7  CL 100* 104  < > 100* 104 105  CO2 30 32  < > 37* 34* 32  BUN 34* 26*  < > 26* 30* 28*  CREATININE 1.07 0.87  < > 0.89 0.89 0.90  CALCIUM 7.0* 7.0*  < > 7.6* 7.7* 7.5*  MG 2.2 2.1  --   --  2.0  --   PHOS 2.1* 1.9*  --   --  2.3*  --   GLUCOSE 249* 141*  < > 189* 163* 154*  < > = values in this interval not displayed.  Glucose Profile:  Recent Labs  01/10/16 0417 01/10/16 0808 01/10/16 1202  GLUCAP 129* 98 161*   Meds: ss novolog, lantus, solumedrol, diprivan (235 kcals in 24 hours)  Height:   Ht Readings from Last 1 Encounters:  01/05/2016  (1.676 m)    Weight:   Wt Readings from Last 1 Encounters:  01/10/16 125 lb 14.1 oz (57.1 kg)   Filed Weights   01/08/16 0500 01/09/16 0500 01/10/16 0516  Weight: 127 lb 10.3 oz (57.9 kg) 126 lb 12.2 oz (57.5 kg) 125 lb 14.1 oz (57.1 kg)    BMI:  Body mass index is 20.33 kg/(m^2).  Estimated Nutritional Needs:   Kcal:  1468 kcals (BEE 1243, Ve: 5.1, Tmax: 37.9) using wt of 55 kg  Protein:  83-110 g  (1.5-2.0 g/kg)   Fluid:  1375-1650 mL (25-30 ml/kg)   HIGH Care Level  Romelle Starcher MS, RD, LDN [phone] Pager  [phone] Weekend/On-Call Pager

## 2016-01-10 NOTE — Care Management (Signed)
Spoke with attending regarding ltach level of care.  he will assess patient and discuss with family  update CM on outcome.

## 2016-01-11 ENCOUNTER — Inpatient Hospital Stay: Payer: Medicare Other

## 2016-01-11 DIAGNOSIS — J101 Influenza due to other identified influenza virus with other respiratory manifestations: Secondary | ICD-10-CM

## 2016-01-11 DIAGNOSIS — R410 Disorientation, unspecified: Secondary | ICD-10-CM

## 2016-01-11 LAB — CULTURE, BLOOD (ROUTINE X 2)
CULTURE: NO GROWTH
CULTURE: NO GROWTH

## 2016-01-11 LAB — GLUCOSE, CAPILLARY
GLUCOSE-CAPILLARY: 101 mg/dL — AB (ref 65–99)
GLUCOSE-CAPILLARY: 123 mg/dL — AB (ref 65–99)
GLUCOSE-CAPILLARY: 169 mg/dL — AB (ref 65–99)
GLUCOSE-CAPILLARY: 185 mg/dL — AB (ref 65–99)
GLUCOSE-CAPILLARY: 188 mg/dL — AB (ref 65–99)
Glucose-Capillary: 178 mg/dL — ABNORMAL HIGH (ref 65–99)

## 2016-01-11 LAB — BASIC METABOLIC PANEL
ANION GAP: 5 (ref 5–15)
BUN: 31 mg/dL — ABNORMAL HIGH (ref 6–20)
CHLORIDE: 104 mmol/L (ref 101–111)
CO2: 33 mmol/L — ABNORMAL HIGH (ref 22–32)
Calcium: 7.6 mg/dL — ABNORMAL LOW (ref 8.9–10.3)
Creatinine, Ser: 0.87 mg/dL (ref 0.61–1.24)
GFR calc Af Amer: >60 mL/min (ref >60)
Glucose, Bld: 202 mg/dL — ABNORMAL HIGH (ref 65–99)
POTASSIUM: 4.2 mmol/L (ref 3.5–5.1)
SODIUM: 142 mmol/L (ref 135–145)

## 2016-01-11 LAB — CBC
HCT: 22.3 % — ABNORMAL LOW (ref 40.0–52.0)
HEMOGLOBIN: 7.3 g/dL — AB (ref 13.0–18.0)
MCH: 30 pg (ref 26.0–34.0)
MCHC: 32.6 g/dL (ref 32.0–36.0)
MCV: 91.9 fL (ref 80.0–100.0)
PLATELETS: 528 10*3/uL — AB (ref 150–440)
RBC: 2.42 MIL/uL — AB (ref 4.40–5.90)
RDW: 15.1 % — ABNORMAL HIGH (ref 11.5–14.5)
WBC: 30.7 10*3/uL — AB (ref 3.8–10.6)

## 2016-01-11 MED ORDER — DEXMEDETOMIDINE HCL IN NACL 400 MCG/100ML IV SOLN
0.4000 ug/kg/h | INTRAVENOUS | Status: DC
  Administered 2016-01-11: 1.2 ug/kg/h via INTRAVENOUS
  Administered 2016-01-11: 0.4 ug/kg/h via INTRAVENOUS
  Filled 2016-01-11: qty 100
  Filled 2016-01-11: qty 50
  Filled 2016-01-11: qty 100

## 2016-01-11 MED ORDER — PROPOFOL 1000 MG/100ML IV EMUL
0.0000 ug/kg/min | INTRAVENOUS | Status: DC
  Administered 2016-01-11: 37.866 ug/kg/min via INTRAVENOUS
  Filled 2016-01-11: qty 100

## 2016-01-11 MED ORDER — OSELTAMIVIR PHOSPHATE 75 MG PO CAPS
75.0000 mg | ORAL_CAPSULE | Freq: Two times a day (BID) | ORAL | Status: DC
  Administered 2016-01-11 – 2016-01-14 (×7): 75 mg
  Filled 2016-01-11 (×4): qty 1

## 2016-01-11 MED ORDER — PROPOFOL 1000 MG/100ML IV EMUL
5.0000 ug/kg/min | INTRAVENOUS | Status: DC
  Administered 2016-01-12 (×2): 50 ug/kg/min via INTRAVENOUS
  Administered 2016-01-12 (×2): 70 ug/kg/min via INTRAVENOUS
  Filled 2016-01-11 (×2): qty 100

## 2016-01-11 MED ORDER — SENNOSIDES-DOCUSATE SODIUM 8.6-50 MG PO TABS
2.0000 | ORAL_TABLET | Freq: Two times a day (BID) | ORAL | Status: DC
  Administered 2016-01-11 – 2016-01-12 (×2): 2
  Filled 2016-01-11 (×2): qty 2

## 2016-01-11 NOTE — Progress Notes (Signed)
Versed 1 mg IV given.  Patient agitated.  Attempting to pull on ETT.   [facility] called to request Propofol to be restarted.  Awaiting order.

## 2016-01-11 NOTE — Progress Notes (Signed)
Propofol restarted.

## 2016-01-11 NOTE — Progress Notes (Addendum)
PULMONARY / CRITICAL CARE MEDICINE   Name: Larry Pacheco MRN: [id number] DOB: Jan 07, 1946    ADMISSION DATE:  12/23/2015  BRIEF HISTORY: 70 year old male with no significant past medical history, admitted on 12/30/2011 for shortness of breath, and intermittent hemoptysis, started experiencing shortness of hemoptysis along with cough about one week ago after going out to the [place] to bird watch. Nonsmoker, states he is in good health otherwise. CTA chest negative for pulmonary embolus. Acute decline in respiratory status from nasal cannula to high flow oxygen to requiring intubation.  SUBJECTIVE:  Still requiring high FiO2 and desaturates with any movement/position changes. BAL cultures positive for influenza A.   VITAL SIGNS: Temp:  [97.9 F (36.6 C)-99.2 F (37.3 C)] 99.2 F (37.3 C) (02/25 0700) Pulse Rate:  [72-117] 72 (02/25 0900) Resp:  [10-34] 14 (02/25 0900) BP: (84-143)/(53-89) 95/53 mmHg (02/25 0900) SpO2:  [77 %-100 %] 95 % (02/25 0900) FiO2 (%):  [70 %-80 %] 80 % (02/25 0828) Weight:  [125 lb 10.6 oz (57 kg)] 125 lb 10.6 oz (57 kg) (02/25 0500) HEMODYNAMICS:   VENTILATOR SETTINGS: Vent Mode:  [-] PRVC FiO2 (%):  [70 %-80 %] 80 % Set Rate:  [12 bmp] 12 bmp Vt Set:  [500 mL] 500 mL PEEP:  [10 cmH20-12 cmH20] 12 cmH20 Plateau Pressure:  [18 cmH20-20 cmH20] 18 cmH20 INTAKE / OUTPUT:  Intake/Output Summary (Last 24 hours) at 01/11/16 1040 Last data filed at 01/11/16 1027  Gross per 24 hour  Intake 1816.6 ml  Output   1200 ml  Net  616.6 ml    Review of Systems  Unable to perform ROS: intubated    Physical Exam  Constitutional: He is well-developed, well-nourished, and in no distress.  HENT:  Head: Normocephalic.  Mouth/Throat: Oropharynx is clear and moist.  Eyes: Pupils are equal, round, and reactive to light.  Neck: Neck supple. No JVD present. No thyromegaly present.  Cardiovascular: Regular rhythm, normal heart sounds and intact distal pulses.   No  murmur heard. Pulmonary/Chest: No respiratory distress. He has no wheezes. He has no rales.  No distress on the vent.  No wheezes Breath sounds diminished in the bases  Abdominal: Soft. Bowel sounds are normal. There is no tenderness. There is no rebound.  Mild distention but soft, hypoactive bowel sounds  Genitourinary: Penis normal.  Musculoskeletal: He exhibits no edema.  Neurological: He displays normal reflexes. No cranial nerve deficit.  Sedated on the vent. Withdraws to noxious stimulus  Skin: Skin is warm.  Nursing note and vitals reviewed.    LABS:  CBC  Recent Labs Lab 01/09/16 0517 01/10/16 0454 01/11/16 0423  WBC 32.7* 33.7* 30.7*  HGB 8.4* 8.2* 7.3*  HCT 24.8* 24.3* 22.3*  PLT 710* 685* 528*   Coag's No results for input(s): APTT, INR in the last 168 hours. BMET  Recent Labs Lab 01/09/16 0517 01/10/16 0454 01/11/16 0423  NA 144 143 142  K 4.0 3.7 4.2  CL 104 105 104  CO2 34* 32 33*  BUN 30* 28* 31*  CREATININE 0.89 0.90 0.87  GLUCOSE 163* 154* 202*   Electrolytes  Recent Labs Lab 01/05/16 0426 01/06/16 0457  01/09/16 0517 01/10/16 0454 01/11/16 0423  CALCIUM 7.0* 7.0*  < > 7.7* 7.5* 7.6*  MG 2.2 2.1  --  2.0  --   --   PHOS 2.1* 1.9*  --  2.3*  --   --   < > = values in this interval not displayed. Sepsis Markers  Recent Labs Lab 01/09/16 0517  PROCALCITON 0.57   ABG  Recent Labs Lab 01/06/16 0430 01/08/16 0446 01/10/16 0451  PHART 7.49* 7.50* 7.47*  PCO2ART 43 48 46  PO2ART 56* 63* 43*   Liver Enzymes No results for input(s): AST, ALT, ALKPHOS, BILITOT, ALBUMIN in the last 168 hours. Cardiac Enzymes No results for input(s): TROPONINI, PROBNP in the last 168 hours. Glucose  Recent Labs Lab 01/10/16 1202 01/10/16 1558 01/10/16 1941 01/11/16 0042 01/11/16 0353 01/11/16 0731  GLUCAP 161* 178* 179* 123* 185* 169*    Dg Chest 1 View  01/11/2016  CLINICAL DATA:  70 year old male shortness of breath and respiratory  failure. EXAM: CHEST 1 VIEW COMPARISON:  01/10/2016 and prior studies FINDINGS: Cardiomediastinal silhouette is unchanged. An endotracheal tube with tip 5 cm above the carina, right IJ central venous catheter with tip overlying the mid SVC and NG tube entering the stomach again identified. Bilateral airspace opacities have slightly decreased. There is no evidence of pneumothorax or large pleural effusion. IMPRESSION: Slightly decreased bilateral airspace opacities without other significant change. Electronically Signed   By: Harmon Pier M.D.   On: 01/11/2016 07:06   STUDIES:  CTA chest 2/12>> no PE, multilobar basilar pneumonia, mildly tight findings, pneumonitis CT chest with contrast 02/22>Diffuse pulmonary airspace disease and small bilateral effusions and mild mediastinal lymphadenopathy  SIGNIFICANT EVENTS: 2/16>> worsening hypoxia requiring high flow nasal cannula, unable to wean, subsequently intubated  Micro: MRSA PCR 02/15 >>NEG Rapid flu 02/13 >> NEG Blood 2/13 >> 1/2 staph saccharolyticus (likely contaminant) Urine 02/13 >> NEG C diff 02/15 >> NEG Resp 02/15 >> light growth candida BAL 2/16 >> heavy growth yeast (candida albicans) and influenza A HIV 02/20 >> NEG Resp 02/20 >>  Pneumocystis DFA 02/20 >>  Legionella DFA 02/20 >>     Antibiotics: Azithro 02/13 >> 02/19 Ceftriaxone 02/13 >> 02/15 Vanc 2/15 >> 02/18 Zosyn 2/15 >>02/22  Voriconazole 2/22> Doxycycline 02/23> Tamiflu 02/25>  Lines: R IJ CVL 02/16 >>  ETT 02/15 >>    ASSESSMENT / PLAN: 70 year old male no significant past medical history now with acute respiratory failure/ARDS, multifocal pneumonia of unclear etiology, cough, dyspnea, accidental fall, requiring supplemental oxygen, chest CT concerning for interstitial lung disease versus multifocal pneumonia/hypersensivity pneumonitis; with minimal improvement. Still requiring high FiO2 and high doses of sedation   A: PULMONARY VDRF 2/2 influenza  pneumonitis and ARDS, unable to wean due poor lung aeration on CXR and high O2 needs despite treatment.  Influenza A pneumonitis-Persistent bilateral pulmonary infiltrates Hemoptysis-resolved P:  Cont vent support - settings reviewed and/or adjusted PCV-patient tolerates PCV better Use ARDS PEEP/FiO2 algorithm D/C IV steroids Antimicrobials per ID F/U 02/23 cultures Titrate down FiO2 as tolerated  A: CARDIOVASCULAR Minimally elevated troponin - likely demand ischemia Hypertension Sinus tachycardia-HR improved  P:  Continue metoprolol 25 mg by mouth q8h and PRN IV dose to maintain SBP<160 MAP goal > 65 mmHg D/C Diltiazem  A: RENAL No issues P: Monitor BMET intermittently Monitor I/Os Correct electrolytes   A: GASTROINTESTINAL A: Melana-Resolved Ileus  P: SUP: IV famotidine Continue tube feeds as tolerated  A: HEMATOLOGIC Acute blood loss anemia - no active bleeding evident; Hg trending down; Hg 7.2(8.2) P: DVT px: Heparin 5000 units subcutaneous every 12. Using conservative dosing due to recent history of GI bleed. Monitor CBC intermittently Transfuse per usual guidelines  INFECTIOUS A: Influenza A Pneumonitis He has completed a course of antibacterial agents. He has now been started on voriconazole empirically due to  Candida in bronchoscopy results. P: Doxy, tamiflu and voriconazole per ID Monitor temp, WBC count F/u 02/23 cultures Checked HIV 02/20 >> NEG   A: ENDOCRINE A: Steroid induced hyperglycemia-no prior dx of DM, glucose is now well controlled. P: Continue SSI - mod scale Continue Lantus at 10 units, sliding scale coverage as above.  A: NEUROLOGIC ICU/vent associated discomfort P:  RASS goal: -1, -2 PAD protocol - fent/PRECEDEX Titrate off propofol  Best Practice: Code Status:  Full. Diet: Tube feeds GI prophylaxis:  H2 blocker VTE prophylaxis:  Subcutaneous heparin.  Critical care time spent examining patient, establishing  treatment plan, managing vent, reviewing history, labs and CXR is 38 minutes  Magdalene S. Tukov ANP-BC Pulmonary and Critical Care Medicine [facility]  PCCM ATTENDING ATTESTATION: I have evaluated patient with ANP Luci Bank, reviewed database in its entirety and discussed care plan in detail. In addition, this patient was discussed on multidisciplinary rounds.   Important findings: Agitation and dyssynchrony on WUA CXR with mild improvement in B infiltrates Hemodynamically stable Renal function stable Tolerating TFs   Major problems addressed by PCCM team: VDRF ARDS Influenza pneumonitis ICU acquired delirium  PLAN/REC: Cont full vent support - settings reviewed and/or adjusted Cont vent bundle Daily SBT if/when meets criteria Will very likely require trach tube placement to complete vent wean Monitor BMET intermittently Monitor I/Os Correct electrolytes as indicated DVT px: SQ heparin Monitor CBC intermittently Transfuse per usual ICU guidelines Continue oseltamivir  Updated pt's sister and HCPOA in detail. I explained that he continues to require high level of vent support but this is single organ failure in a man who was quite healthy previously. I think his prognosis for functional recovery is good. However, he is likely to require trach, LTACH and significant rehab to achieve that recovery    CCM time: 45 mins   Larry Fischer, MD PCCM service Mobile [phone] Pager [phone]

## 2016-01-11 NOTE — Progress Notes (Signed)
Call to patients sister - dianne [phone] Update on patients condition - influenza, continued process regarding vent and sedation wean. need for LTAC and Trach

## 2016-01-11 NOTE — Progress Notes (Signed)
Patient agitation controlled since propofol restarted.  Precedex stopped.

## 2016-01-11 NOTE — Progress Notes (Signed)
eLink Physician-Brief Progress Note Patient Name: Larry Pacheco DOB: [birth year] MRN: [id number]   Date of Service  01/11/2016  HPI/Events of Note  precedex maxed out and getting fentanyl and versed as well but agitation still not controlled  eICU Interventions  Change back to propofol, d/c precedex     Intervention Category Major Interventions: Delirium, psychosis, severe agitation - evaluation and management  Sandrea Hughs 01/11/2016, 9:09 PM

## 2016-01-11 NOTE — Progress Notes (Signed)
[facility] - [place] at [facility]   PATIENT NAME: Larry Pacheco    MR#:  [id number]  DATE OF BIRTH:  December 10, 1945  SUBJECTIVE:  Unable to obtain-patient on vent Some agitation overnight REVIEW OF SYSTEMS:  nable to obtain given patient's mental status medical condition  DRUG ALLERGIES:  No Known Allergies  VITALS:  Blood pressure 95/56, pulse 65, temperature 99.5 F (37.5 C), temperature source Axillary, resp. rate 12, height  (1.676 m), weight 57 kg (125 lb 10.6 oz), SpO2 99 %.  PHYSICAL EXAMINATION:   VITAL SIGNS: Filed Vitals:   01/11/16 1200 01/11/16 1300  BP: 103/63 95/56  Pulse: 86 65  Temp: 99.5 F (37.5 C)   Resp: 21 12   GENERAL:70 y.o.male critically ill intubated mechanical ventilation HEAD: Normocephalic, atraumatic.  EYES: Pupils equal, round, reactive to light. Unable to assess extraocular muscles given mental status/medical condition. No scleral icterus.  MOUTH: Moist mucosal membrane. Dentition intact. No abscess noted.  EAR, NOSE, THROAT: Clear without exudates. No external lesions.  NECK: Supple. No thyromegaly. No nodules. No JVD.  PULMONARY: diffuse coarse breath sounds without wheeze rails or rhonci. No use of accessory muscles, mechanical venlation CHEST: Nontender to palpation.  CARDIOVASCULAR: S1 and S2. Regular rate and rhythm. No murmurs, rubs, or gallops. No edema. Pedal pulses 2+ bilaterally.  GASTROINTESTINAL: Soft, nontender, nondistended. No masses. Positive bowel sounds. No hepatosplenomegaly.  MUSCULOSKELETAL: No swelling, clubbing, or edema. Passive Range of motion full in all extremities.  NEUROLOGIC: Unable to assess given mental status/medical condition SKIN: No ulceration, lesions, rashes, or cyanosis. Skin warm and dry. Turgor intact.  PSYCHIATRIC: Unable to assess given mental status/medical condition       LABORATORY PANEL:   CBC  Recent Labs Lab 01/11/16 0423  WBC 30.7*  HGB 7.3*  HCT  22.3*  PLT 528*   ------------------------------------------------------------------------------------------------------------------  Chemistries   Recent Labs Lab 01/09/16 0517  01/11/16 0423  NA 144  < > 142  K 4.0  < > 4.2  CL 104  < > 104  CO2 34*  < > 33*  GLUCOSE 163*  < > 202*  BUN 30*  < > 31*  CREATININE 0.89  < > 0.87  CALCIUM 7.7*  < > 7.6*  MG 2.0  --   --   < > = values in this interval not displayed. ------------------------------------------------------------------------------------------------------------------  Cardiac Enzymes No results for input(s): TROPONINI in the last 168 hours. ------------------------------------------------------------------------------------------------------------------  RADIOLOGY:  Dg Chest 1 View  01/11/2016  CLINICAL DATA:  70 year old male shortness of breath and respiratory failure. EXAM: CHEST 1 VIEW COMPARISON:  01/10/2016 and prior studies FINDINGS: Cardiomediastinal silhouette is unchanged. An endotracheal tube with tip 5 cm above the carina, right IJ central venous catheter with tip overlying the mid SVC and NG tube entering the stomach again identified. Bilateral airspace opacities have slightly decreased. There is no evidence of pneumothorax or large pleural effusion. IMPRESSION: Slightly decreased bilateral airspace opacities without other significant change. Electronically Signed   By: Harmon Pier M.D.   On: 01/11/2016 07:06   Dg Chest 1 View  01/10/2016  CLINICAL DATA:  Shortness of breath . EXAM: CHEST 1 VIEW COMPARISON:  01/09/2016.  CT 01/08/2016. FINDINGS: Endotracheal tube, NG tube, right IJ line in stable position. Heart size normal. Diffuse bilateral pulmonary infiltrates again noted. No interim change. No pleural effusion or pneumothorax. IMPRESSION: 1. Lines and tubes in stable position. 2. Diffuse bilateral airspace disease with no interim change from prior  exam. Electronically Signed   By: Maisie Fus  Register   On:  01/10/2016 07:11    EKG:   Orders placed or performed during the hospital encounter of 01/03/2016  . EKG 12-Lead  . EKG 12-Lead  . EKG 12-Lead  . EKG 12-Lead  . ED EKG 12-Lead  . ED EKG 12-Lead  . ED EKG  . ED EKG  . EKG 12-Lead  . EKG 12-Lead  . EKG 12-Lead  . EKG 12-Lead    ASSESSMENT AND PLAN:   70 year old Caucasian gentleman admitted 2016-01-03  1. sepsis with pneumonia bilaterally increasing leukocytosis. Influenza positive on viral culture started Tamiflu discontinued antibiotics  2. Acute respiratory failure with hypoxia. Secondary to rapidly progressing pneumonia. Patient on 85% FiO2. anca negative. full vent support wean as tolerated 3. GI bleed  Protonix. 4. Nutrition - continue tube feeds.  5. Essential hypertension, tachycardia- patient started on metoprolol and Cardizem. Blood pressure and heart rate very labile 6. Hypokalemia, hypophosphatemia- replaced  Disposition: likely LTAC    All the records are reviewed and case discussed with Care Management/Social Workerr. Management plans discussed with the patient, family and they are in agreement.  CODE STATUS: full  TOTAL TIME TAKING CARE OF THIS PATIENT: 33 minutes.   POSSIBLE D/C IN 2-3 DAYS, DEPENDING ON CLINICAL CONDITION.   Damain Broadus,  Mardi Mainland.D on 01/11/2016 at 2:04 PM  Between 7am to 6pm - Pager - [phone]  After 6pm: House Pager: - [phone]  [facility]  Office  [phone]  CC: Primary care physician; No primary care provider on file.

## 2016-01-12 ENCOUNTER — Inpatient Hospital Stay: Payer: Medicare Other

## 2016-01-12 DIAGNOSIS — E8770 Fluid overload, unspecified: Secondary | ICD-10-CM

## 2016-01-12 DIAGNOSIS — D62 Acute posthemorrhagic anemia: Secondary | ICD-10-CM

## 2016-01-12 LAB — PHOSPHORUS: Phosphorus: 2.3 mg/dL — ABNORMAL LOW (ref 2.5–4.6)

## 2016-01-12 LAB — GLUCOSE, CAPILLARY
GLUCOSE-CAPILLARY: 106 mg/dL — AB (ref 65–99)
GLUCOSE-CAPILLARY: 124 mg/dL — AB (ref 65–99)
GLUCOSE-CAPILLARY: 161 mg/dL — AB (ref 65–99)
GLUCOSE-CAPILLARY: 131 mg/dL — AB (ref 65–99)
Glucose-Capillary: 111 mg/dL — ABNORMAL HIGH (ref 65–99)
Glucose-Capillary: 147 mg/dL — ABNORMAL HIGH (ref 65–99)

## 2016-01-12 LAB — BASIC METABOLIC PANEL
Anion gap: 7 (ref 5–15)
BUN: 30 mg/dL — AB (ref 6–20)
CHLORIDE: 105 mmol/L (ref 101–111)
CO2: 30 mmol/L (ref 22–32)
Calcium: 7.5 mg/dL — ABNORMAL LOW (ref 8.9–10.3)
Creatinine, Ser: 0.7 mg/dL (ref 0.61–1.24)
GFR calc Af Amer: >60 mL/min (ref >60)
GFR calc non Af Amer: >60 mL/min (ref >60)
Glucose, Bld: 100 mg/dL — ABNORMAL HIGH (ref 65–99)
Potassium: 3.6 mmol/L (ref 3.5–5.1)
Sodium: 142 mmol/L (ref 135–145)

## 2016-01-12 LAB — CBC
HCT: 20.1 % — ABNORMAL LOW (ref 40.0–52.0)
Hemoglobin: 6.6 g/dL — ABNORMAL LOW (ref 13.0–18.0)
MCH: 29.5 pg (ref 26.0–34.0)
MCHC: 32.7 g/dL (ref 32.0–36.0)
MCV: 90.3 fL (ref 80.0–100.0)
PLATELETS: 497 10*3/uL — AB (ref 150–440)
RBC: 2.23 MIL/uL — ABNORMAL LOW (ref 4.40–5.90)
RDW: 14.6 % — AB (ref 11.5–14.5)
WBC: 23.6 10*3/uL — ABNORMAL HIGH (ref 3.8–10.6)

## 2016-01-12 LAB — MAGNESIUM: Magnesium: 1.9 mg/dL (ref 1.7–2.4)

## 2016-01-12 LAB — PREPARE RBC (CROSSMATCH)

## 2016-01-12 MED ORDER — METOPROLOL TARTRATE 50 MG PO TABS
50.0000 mg | ORAL_TABLET | Freq: Three times a day (TID) | ORAL | Status: DC
  Administered 2016-01-12 – 2016-01-13 (×3): 50 mg
  Filled 2016-01-12 (×5): qty 1

## 2016-01-12 MED ORDER — QUETIAPINE FUMARATE 100 MG PO TABS
100.0000 mg | ORAL_TABLET | Freq: Every day | ORAL | Status: DC
  Administered 2016-01-12 – 2016-01-14 (×3): 100 mg via ORAL
  Filled 2016-01-12 (×3): qty 1

## 2016-01-12 MED ORDER — SODIUM CHLORIDE 0.9 % IV SOLN
Freq: Once | INTRAVENOUS | Status: AC
  Administered 2016-01-12: 17:00:00 via INTRAVENOUS

## 2016-01-12 MED ORDER — FUROSEMIDE 10 MG/ML IJ SOLN
40.0000 mg | Freq: Four times a day (QID) | INTRAMUSCULAR | Status: AC
  Administered 2016-01-12 (×2): 40 mg via INTRAVENOUS
  Filled 2016-01-12 (×2): qty 4

## 2016-01-12 MED ORDER — POTASSIUM CHLORIDE 20 MEQ/15ML (10%) PO SOLN
40.0000 meq | Freq: Four times a day (QID) | ORAL | Status: AC
  Administered 2016-01-12 (×2): 40 meq
  Filled 2016-01-12 (×2): qty 30

## 2016-01-12 MED ORDER — METOPROLOL TARTRATE 1 MG/ML IV SOLN
2.5000 mg | INTRAVENOUS | Status: DC | PRN
  Administered 2016-01-12 – 2016-01-14 (×2): 5 mg via INTRAVENOUS
  Administered 2016-01-14: 2.5 mg via INTRAVENOUS
  Filled 2016-01-12 (×3): qty 5

## 2016-01-12 MED ORDER — METOPROLOL TARTRATE 25 MG PO TABS
25.0000 mg | ORAL_TABLET | Freq: Once | ORAL | Status: AC
  Administered 2016-01-12: 25 mg via ORAL

## 2016-01-12 MED ORDER — PANTOPRAZOLE SODIUM 40 MG IV SOLR
40.0000 mg | Freq: Two times a day (BID) | INTRAVENOUS | Status: DC
  Administered 2016-01-12 – 2016-01-15 (×7): 40 mg via INTRAVENOUS
  Filled 2016-01-12 (×7): qty 40

## 2016-01-12 MED ORDER — MIDAZOLAM HCL 5 MG/ML IJ SOLN
0.0000 mg/h | INTRAMUSCULAR | Status: DC
  Administered 2016-01-12 (×2): 5 mg/h via INTRAVENOUS
  Administered 2016-01-12: 4 mg/h via INTRAVENOUS
  Administered 2016-01-13: 2 mg/h via INTRAVENOUS
  Filled 2016-01-12 (×4): qty 10

## 2016-01-12 NOTE — Progress Notes (Signed)
[facility] - [place] at [facility]   PATIENT NAME: Larry Pacheco    MR#:  [id number]  DATE OF BIRTH:  04/13/1946  SUBJECTIVE:  Unable to obtain-patient on vent Some agitation overnight restarted back on propofol REVIEW OF SYSTEMS:  nable to obtain given patient's mental status medical condition  DRUG ALLERGIES:  No Known Allergies  VITALS:  Blood pressure 167/114, pulse 139, temperature 100 F (37.8 C), temperature source Axillary, resp. rate 43, height  (1.676 m), weight 57.8 kg (127 lb 6.8 oz), SpO2 93 %.  PHYSICAL EXAMINATION:   VITAL SIGNS: Filed Vitals:   01/12/16 1200 01/12/16 1300  BP: 128/71 167/114  Pulse: 138 139  Temp: 100 F (37.8 C)   Resp: 32 43   GENERAL:70 y.o.male critically ill intubated mechanical ventilation HEAD: Normocephalic, atraumatic.  EYES: Pupils equal, round, reactive to light. Unable to assess extraocular muscles given mental status/medical condition. No scleral icterus.  MOUTH: Moist mucosal membrane. Dentition intact. No abscess noted.  EAR, NOSE, THROAT: Clear without exudates. No external lesions.  NECK: Supple. No thyromegaly. No nodules. No JVD.  PULMONARY: diffuse coarse breath sounds without wheeze rails or rhonci. No use of accessory muscles, mechanical venlation CHEST: Nontender to palpation.  CARDIOVASCULAR: S1 and S2. Regular rate and rhythm. No murmurs, rubs, or gallops. No edema. Pedal pulses 2+ bilaterally.  GASTROINTESTINAL: Soft, nontender, nondistended. No masses. Positive bowel sounds. No hepatosplenomegaly.  MUSCULOSKELETAL: No swelling, clubbing, or edema. Passive Range of motion full in all extremities.  NEUROLOGIC: Unable to assess given mental status/medical condition SKIN: No ulceration, lesions, rashes, or cyanosis. Skin warm and dry. Turgor intact.  PSYCHIATRIC: Unable to assess given mental status/medical condition       LABORATORY PANEL:   CBC  Recent Labs Lab  01/12/16 0533  WBC 23.6*  HGB 6.6*  HCT 20.1*  PLT 497*   ------------------------------------------------------------------------------------------------------------------  Chemistries   Recent Labs Lab 01/12/16 0533  NA 142  K 3.6  CL 105  CO2 30  GLUCOSE 100*  BUN 30*  CREATININE 0.70  CALCIUM 7.5*  MG 1.9   ------------------------------------------------------------------------------------------------------------------  Cardiac Enzymes No results for input(s): TROPONINI in the last 168 hours. ------------------------------------------------------------------------------------------------------------------  RADIOLOGY:  Dg Chest 1 View  01/11/2016  CLINICAL DATA:  70 year old male shortness of breath and respiratory failure. EXAM: CHEST 1 VIEW COMPARISON:  01/10/2016 and prior studies FINDINGS: Cardiomediastinal silhouette is unchanged. An endotracheal tube with tip 5 cm above the carina, right IJ central venous catheter with tip overlying the mid SVC and NG tube entering the stomach again identified. Bilateral airspace opacities have slightly decreased. There is no evidence of pneumothorax or large pleural effusion. IMPRESSION: Slightly decreased bilateral airspace opacities without other significant change. Electronically Signed   By: Harmon Pier M.D.   On: 01/11/2016 07:06   Dg Chest Port 1 View  01/12/2016  CLINICAL DATA:  70 year old male with respiratory failure. EXAM: PORTABLE CHEST 1 VIEW COMPARISON:  01/11/2016 and prior exams FINDINGS: An endotracheal tube with tip 5.2 cm above the carina, right IJ central venous catheter with tip overlying the lower SVC, enteric tube entering the stomach with tip off the field of view again noted. Bilateral airspace opacities are unchanged. No large pleural effusions or pneumothorax identified. IMPRESSION: Unchanged appearance of the chest with continued bilateral airspace opacities Electronically Signed   By: Harmon Pier M.D.   On:  01/12/2016 07:38    EKG:   Orders placed or performed during the hospital encounter of  01/10/2016  . EKG 12-Lead  . EKG 12-Lead  . EKG 12-Lead  . EKG 12-Lead  . ED EKG 12-Lead  . ED EKG 12-Lead  . ED EKG  . ED EKG  . EKG 12-Lead  . EKG 12-Lead  . EKG 12-Lead  . EKG 12-Lead    ASSESSMENT AND PLAN:   70 year old Caucasian gentleman admitted 12/25/2015  1. sepsis with pneumonia bilaterally increasing leukocytosis. Influenza positive on viral culture continue Tamiflu off antibiotics 2. Blood loss anemia: Dark stools noted currently on Pepcid will increase to Protonix IV, transfuse 1 unit prbc 3.   4. Acute respiratory failure with hypoxia full vent support wean as tolerated, wean sedation as tolerated and Seroquel at nighttime to hopefully aid in this process 5. Nutrition - continue tube feeds.  6. Essential hypertension, tachycardia- patient started on metoprolol and Cardizem. 7. Hypokalemia, hypophosphatemia- replaced  Disposition: likely LTAC    All the records are reviewed and case discussed with Care Management/Social Workerr. Management plans discussed with the patient, family and they are in agreement.  CODE STATUS: full  TOTAL TIME TAKING CARE OF THIS PATIENT: 33 minutes.   POSSIBLE D/C IN 3-4 DAYS, DEPENDING ON CLINICAL CONDITION.   Hayes Czaja,  Mardi Mainland.D on 01/12/2016 at 2:18 PM  Between 7am to 6pm - Pager - [phone]  After 6pm: House Pager: - [phone]  [facility]  Office  [phone]  CC: Primary care physician; No primary care provider on file.

## 2016-01-12 NOTE — Progress Notes (Addendum)
PULMONARY / CRITICAL CARE MEDICINE   Name: Larry Pacheco MRN: [id number] DOB: 13-Aug-1946    ADMISSION DATE:  01/03/2016  BRIEF HISTORY: 70 year old male with no stomach and past medical history, admitted on 12/30/2011 for shortness of breath, and intermittent hemoptysis, started experiencing shortness of hemoptysis along with cough about one week ago after going out to the [place] to bird watch. Nonsmoker, states he is in good health otherwise. CTA chest negative for pulmonary embolus. Acute decline in respiratory status from nasal cannula to high flow oxygen to requiring intubation.  STUDIES:  02/12 CT chest: no PE, multilobar basilar pneumonia, mildly tight findings, pneumonitis 02/16 TTE: LVEF 55-60% 02/22 CT chest: Diffuse pulmonary airspace disease and small bilateral effusions and mild mediastinal lymphadenopathy   Micro: MRSA PCR 02/15 >>NEG Rapid flu 02/13 >> NEG Blood 2/13 >> 1/2 staph saccharolyticus (likely contaminant) Urine 02/13 >> NEG C diff 02/15 >> NEG Resp 02/15 >> light growth candida BAL 2/16 >> heavy growth yeast (candida albicans) and influenza A HIV 02/20 >> NEG Resp 02/20 >> Moderate yeast Pneumocystis DFA 02/20 >> NEG Legionella DFA 02/20 >> NEG   Antibiotics: Azithro 02/13 >> 02/19 Ceftriaxone 02/13 >> 02/15 Vanc 2/15 >> 02/18 Zosyn 2/15 >>02/22  Voriconazole 2/22 >> 02/25 Doxycycline 02/23 >> 02/25 Tamiflu 02/25 >>   Lines: ETT 02/15 >>  R IJ CVL 02/16 >> 02/26 (removal ordered) PICC (ordered) 02/26 >>    SUBJECTIVE:  Agitated on WUA. RASS -3, not F/C. Tachypnea. tachycardia  VITAL SIGNS: Temp:  [97.2 F (36.2 C)-99.5 F (37.5 C)] 97.9 F (36.6 C) (02/26 0831) Pulse Rate:  [51-137] 137 (02/26 1151) Resp:  [11-40] 40 (02/26 1000) BP: (85-180)/(53-117) 180/99 mmHg (02/26 1151) SpO2:  [89 %-100 %] 90 % (02/26 1000) FiO2 (%):  [65 %-80 %] 65 % (02/26 0843) Weight:  [127 lb 6.8 oz (57.8 kg)] 127 lb 6.8 oz (57.8 kg) (02/26  0435) HEMODYNAMICS:   VENTILATOR SETTINGS: Vent Mode:  [-] PCV FiO2 (%):  [65 %-80 %] 65 % Set Rate:  [14 bmp] 14 bmp PEEP:  [12 cmH20] 12 cmH20 Plateau Pressure:  [6 cmH20] 6 cmH20 INTAKE / OUTPUT:  Intake/Output Summary (Last 24 hours) at 01/12/16 1156 Last data filed at 01/12/16 0600  Gross per 24 hour  Intake 2524.58 ml  Output   1875 ml  Net 649.58 ml    Review of Systems  Unable to perform ROS: intubated    Physical Exam  HENT:  Head: Normocephalic.  Eyes: Pupils are equal, round, and reactive to light.  Neck: No JVD present.  Cardiovascular: Regular rhythm, normal heart sounds and intact distal pulses.   No murmur heard. Pulmonary/Chest: He has no wheezes. He has no rales.  Abdominal: Soft. Bowel sounds are normal.  Musculoskeletal: He exhibits no edema.  Neurological: He displays normal reflexes. No cranial nerve deficit.  Skin: Skin is warm.  Nursing note and vitals reviewed.    LABS:  CBC  Recent Labs Lab 01/10/16 0454 01/11/16 0423 01/12/16 0533  WBC 33.7* 30.7* 23.6*  HGB 8.2* 7.3* 6.6*  HCT 24.3* 22.3* 20.1*  PLT 685* 528* 497*   Coag's No results for input(s): APTT, INR in the last 168 hours. BMET  Recent Labs Lab 01/10/16 0454 01/11/16 0423 01/12/16 0533  NA 143 142 142  K 3.7 4.2 3.6  CL 105 104 105  CO2 32 33* 30  BUN 28* 31* 30*  CREATININE 0.90 0.87 0.70  GLUCOSE 154* 202* 100*   Electrolytes  Recent  Labs Lab 01/06/16 0457  01/09/16 0517 01/10/16 0454 01/11/16 0423 01/12/16 0533  CALCIUM 7.0*  < > 7.7* 7.5* 7.6* 7.5*  MG 2.1  --  2.0  --   --  1.9  PHOS 1.9*  --  2.3*  --   --  2.3*  < > = values in this interval not displayed. Sepsis Markers  Recent Labs Lab 01/09/16 0517  PROCALCITON 0.57   ABG  Recent Labs Lab 01/06/16 0430 01/08/16 0446 01/10/16 0451  PHART 7.49* 7.50* 7.47*  PCO2ART 43 48 46  PO2ART 56* 63* 43*   Liver Enzymes No results for input(s): AST, ALT, ALKPHOS, BILITOT, ALBUMIN in  the last 168 hours. Cardiac Enzymes No results for input(s): TROPONINI, PROBNP in the last 168 hours. Glucose  Recent Labs Lab 01/11/16 1544 01/11/16 2002 01/12/16 0016 01/12/16 0419 01/12/16 0737 01/12/16 1142  GLUCAP 188* 101* 111* 106* 124* 161*    CXR: NSC bilateral alveolar infiltrates  ASSESSMENT / PLAN: A: PULMONARY ARDS Influenza A pneumonitis Prolonged VDRF P:  Cont vent support - settings reviewed and/or adjusted PCV-patient tolerates PCV better Use ARDS PEEP/FiO2 algorithm Trial of diuresis 02/26 and follow up CXR 02/27 ENT consult request placed 02/26 for trach tube placement next week  Would like to see on PEEP < 10 for trach tube placmeent  A: CARDIOVASCULAR Minimally elevated troponin - likely demand ischemia Hypertension Sinus tachycardia P:  increase metoprolol to 50 mg q 8 hrs  A: RENAL Positive fluid balance P: Monitor BMET intermittently Monitor I/Os Correct electrolytes  Trial of diuresis 02/26  A: GASTROINTESTINAL A: Melena-Resolved Ileus - resolved P: SUP: enteral famotidine Continue tube feeds   A: HEMATOLOGIC Acute drop in Hgb (02/26) P: DVT px: SQ UFH Monitor CBC intermittently Transfuse per usual guidelines  One unit ordered 02/26  INFECTIOUS A: Influenza A Pneumonitis P: Monitor temp, WBC count Micro and abx as above  A: ENDOCRINE A: Steroid induced hyperglycemia, controlled - no prior dx of DM P: Continue SSI - mod scale Continue Lantus  A: NEUROLOGIC ICU/vent associated discomfort P:  RASS goal: -1, -2 PAD protocol - cont fentanyl gtt Transition from propofol to midaz gtt 02/26  CCM time: 35 mins   Billy Fischer, MD PCCM service Mobile [phone] Pager [phone]

## 2016-01-12 NOTE — Progress Notes (Signed)
Sedation vacation started at 0820. Pt became tachycardic in the 150's. Respirations in the 40's and O2 sats in the mid 80's. Pt restarted on Propofol at and Fentanyl restarted at . Pt continued to be tachycardic, respirations still in the 40's, and O2 sats still maintaining in upper 80's.   of Versed and of Fentanyl bolus as pt nodded to having pain. After waiting 10 minutes pt continued to have tachycardia, and tachypnea. Dr. Bard Herbert notified. Acknowledged. No new orders. Benen Weida E 9:44 AM 01/12/2016

## 2016-01-13 ENCOUNTER — Encounter: Admission: EM | Disposition: E | Payer: Self-pay | Source: Home / Self Care | Attending: Internal Medicine

## 2016-01-13 ENCOUNTER — Inpatient Hospital Stay: Payer: Medicare Other

## 2016-01-13 LAB — BLOOD GAS, ARTERIAL
Acid-Base Excess: 8.7 mmol/L — ABNORMAL HIGH (ref 0.0–3.0)
BICARBONATE: 34.7 meq/L — AB (ref 21.0–28.0)
FIO2: 0.7
MECHANICAL RATE: 14
O2 Saturation: 93.8 %
PEEP/CPAP: 10 cmH2O
PH ART: 7.4 (ref 7.350–7.450)
Patient temperature: 37
pCO2 arterial: 56 mmHg — ABNORMAL HIGH (ref 32.0–48.0)
pO2, Arterial: 70 mmHg — ABNORMAL LOW (ref 83.0–108.0)

## 2016-01-13 LAB — GLUCOSE, CAPILLARY
GLUCOSE-CAPILLARY: 106 mg/dL — AB (ref 65–99)
GLUCOSE-CAPILLARY: 108 mg/dL — AB (ref 65–99)
GLUCOSE-CAPILLARY: 119 mg/dL — AB (ref 65–99)
GLUCOSE-CAPILLARY: 201 mg/dL — AB (ref 65–99)
GLUCOSE-CAPILLARY: 92 mg/dL (ref 65–99)
Glucose-Capillary: 143 mg/dL — ABNORMAL HIGH (ref 65–99)

## 2016-01-13 LAB — URINALYSIS COMPLETE WITH MICROSCOPIC (ARMC ONLY)
BACTERIA UA: NONE SEEN
BILIRUBIN URINE: NEGATIVE
GLUCOSE, UA: NEGATIVE mg/dL
HGB URINE DIPSTICK: NEGATIVE
Ketones, ur: NEGATIVE mg/dL
LEUKOCYTES UA: NEGATIVE
NITRITE: NEGATIVE
PH: 5 (ref 5.0–8.0)
Protein, ur: 30 mg/dL — AB
SPECIFIC GRAVITY, URINE: 1.015 (ref 1.005–1.030)

## 2016-01-13 LAB — CBC
HCT: 25.2 % — ABNORMAL LOW (ref 40.0–52.0)
Hemoglobin: 8.5 g/dL — ABNORMAL LOW (ref 13.0–18.0)
MCH: 30.5 pg (ref 26.0–34.0)
MCHC: 33.8 g/dL (ref 32.0–36.0)
MCV: 90.1 fL (ref 80.0–100.0)
PLATELETS: 428 10*3/uL (ref 150–440)
RBC: 2.8 MIL/uL — ABNORMAL LOW (ref 4.40–5.90)
RDW: 14.4 % (ref 11.5–14.5)
WBC: 20.2 10*3/uL — ABNORMAL HIGH (ref 3.8–10.6)

## 2016-01-13 LAB — BASIC METABOLIC PANEL
ANION GAP: 6 (ref 5–15)
BUN: 30 mg/dL — AB (ref 6–20)
CALCIUM: 7.3 mg/dL — AB (ref 8.9–10.3)
CO2: 33 mmol/L — AB (ref 22–32)
CREATININE: 1.08 mg/dL (ref 0.61–1.24)
Chloride: 104 mmol/L (ref 101–111)
GFR calc Af Amer: >60 mL/min (ref >60)
GLUCOSE: 116 mg/dL — AB (ref 65–99)
POTASSIUM: 4.2 mmol/L (ref 3.5–5.1)
SODIUM: 143 mmol/L (ref 135–145)

## 2016-01-13 LAB — TRIGLYCERIDES: Triglycerides: 357 mg/dL — ABNORMAL HIGH (ref <150)

## 2016-01-13 SURGERY — LEFT HEART CATH AND CORONARY ANGIOGRAPHY
Anesthesia: Moderate Sedation

## 2016-01-13 MED ORDER — SENNOSIDES-DOCUSATE SODIUM 8.6-50 MG PO TABS: 2.0000 | ORAL_TABLET | Freq: Two times a day (BID) | ORAL | Status: DC | PRN

## 2016-01-13 MED ORDER — PROPOFOL 1000 MG/100ML IV EMUL
5.0000 ug/kg/min | INTRAVENOUS | Status: DC
  Administered 2016-01-13: 25 ug/kg/min via INTRAVENOUS
  Administered 2016-01-13: 10 ug/kg/min via INTRAVENOUS
  Administered 2016-01-14 (×2): 25 ug/kg/min via INTRAVENOUS
  Administered 2016-01-14 – 2016-01-15 (×2): 50 ug/kg/min via INTRAVENOUS
  Administered 2016-01-15 (×2): 25 ug/kg/min via INTRAVENOUS
  Filled 2016-01-13 (×7): qty 100

## 2016-01-13 MED ORDER — VECURONIUM BROMIDE 10 MG IV SOLR
10.0000 mg | INTRAVENOUS | Status: DC | PRN
  Administered 2016-01-13 – 2016-01-14 (×4): 10 mg via INTRAVENOUS
  Filled 2016-01-13 (×4): qty 10

## 2016-01-13 MED ORDER — VITAL AF 1.2 CAL PO LIQD
1000.0000 mL | ORAL | Status: DC
  Administered 2016-01-13: 1000 mL

## 2016-01-13 MED ORDER — METOPROLOL TARTRATE 1 MG/ML IV SOLN
5.0000 mg | Freq: Once | INTRAVENOUS | Status: DC
  Filled 2016-01-13: qty 5

## 2016-01-13 MED ORDER — METOPROLOL TARTRATE 1 MG/ML IV SOLN
5.0000 mg | Freq: Once | INTRAVENOUS | Status: AC
  Administered 2016-01-13: 5 mg via INTRAVENOUS

## 2016-01-13 NOTE — Clinical Social Work Note (Signed)
Patient continues on sedation and vent at this time. LTAC being pursued by RN CM. York Spaniel MSW,LCSW [phone]

## 2016-01-13 NOTE — Progress Notes (Signed)
Called [facility] to request something for HR in the 140s with BPs 90s/60s. Was unable to give metoprolol based on parameters for BP. Pt continued on fentanyl and versed gtt.

## 2016-01-13 NOTE — Progress Notes (Signed)
Metoprolol  IV administered for HR in 140s. Will monitor BP.

## 2016-01-13 NOTE — Care Management (Signed)
Spoke with patient's sister Katheren Puller.  she is in agreement with pursuing ltac and her facility of choice is [facility].  Informed [facility].  CM has informed care team that tracheostomy can be   performed at [facility]ac.  Informed Diane of this and she states that the physicians have said patient should have tracheostomy prior to transfer and that is what she wishes to happen also.  Informed that anticipate trach on 32

## 2016-01-13 NOTE — Progress Notes (Signed)
Patient began coughing, chewing on ETT with oral care and O2 sat decreased to 74%. Fentanyl increased to 250 mcg/hr and versed increased to 5 mg/hr. Patient suctioned and no secretions out. O2 sat is now 90%

## 2016-01-13 NOTE — Progress Notes (Signed)
Patient remains intubated on ventilator. Wake up assessment performed and he does not  Respond. Fentanyl drip decreased to 18 mcg/hr and versed to 2 mg/hr. Will continue to monitor.

## 2016-01-13 NOTE — Progress Notes (Signed)
PULMONARY / CRITICAL CARE MEDICINE   Name: Larry Pacheco MRN: [id number] DOB: 1946-10-08    ADMISSION DATE:  01/02/2016  BRIEF HISTORY: 70 year old male  admitted on 12/30/2011 for shortness of breath, and intermittent hemoptysis, started experiencing shortness of hemoptysis along with cough about one week ago after going out to the [place] to bird watch.  CTA chest negative for pulmonary embolus. Acute decline in respiratory status from nasal cannula to high flow oxygen to requiring intubation.   SUBJECTIVE:  Agitated on WUA. RASS -3, not F/C. Tachypnea. Tachycardia Patient remains on high fio2 70%, PEEP at 10, remains intubated,sedated  STUDIES:  02/12 CT chest: no PE, multilobar basilar pneumonia, mildly tight findings, pneumonitis 02/16 TTE: LVEF 55-60% 02/22 CT chest: Diffuse pulmonary airspace disease and small bilateral effusions and mild mediastinal lymphadenopathy   Micro: MRSA PCR 02/15 >>NEG Rapid flu 02/13 >> NEG Blood 2/13 >> 1/2 staph saccharolyticus (likely contaminant) Urine 02/13 >> NEG C diff 02/15 >> NEG Resp 02/15 >> light growth candida BAL 2/16 >> heavy growth yeast (candida albicans) and influenza A HIV 02/20 >> NEG Resp 02/20 >> Moderate yeast Pneumocystis DFA 02/20 >> NEG Legionella DFA 02/20 >> NEG   Antibiotics: Azithro 02/13 >> 02/19 Ceftriaxone 02/13 >> 02/15 Vanc 2/15 >> 02/18 Zosyn 2/15 >>02/22  Voriconazole 2/22 >> 02/25 Doxycycline 02/23 >> 02/25 Tamiflu 02/25 >>   Lines: ETT 02/15 >>  R IJ CVL 02/16 >> 02/26 (removal ordered) PICC (ordered) 02/26 >>      VITAL SIGNS: Temp:  [98.7 F (37.1 C)-101.9 F (38.8 C)] 98.9 F (37.2 C) (02/27 0734) Pulse Rate:  [110-152] 132 (02/27 0600) Resp:  [18-43] 18 (02/27 0600) BP: (82-180)/(55-114) 94/57 mmHg (02/27 0824) SpO2:  [85 %-100 %] 87 % (02/27 0600) FiO2 (%):  [70 %] 70 % (02/27 0600) Weight:  [129 lb 3 oz (58.6 kg)] 129 lb 3 oz (58.6 kg) (02/27 0500) HEMODYNAMICS:   VENTILATOR  SETTINGS: Vent Mode:  [-] PCV FiO2 (%):  [70 %] 70 % Set Rate:  [14 bmp] 14 bmp PEEP:  [10 cmH20-12 cmH20] 10 cmH20 Plateau Pressure:  [31 cmH20] 31 cmH20 INTAKE / OUTPUT:  Intake/Output Summary (Last 24 hours) at Feb 11, 2016 0851 Last data filed at 02/11/16 0600  Gross per 24 hour  Intake 3162.82 ml  Output   4875 ml  Net -1712.18 ml    Review of Systems  Unable to perform ROS: intubated    Physical Exam  HENT:  Head: Normocephalic.  Eyes: Pupils are equal, round, and reactive to light.  Neck: No JVD present.  Cardiovascular: Regular rhythm, normal heart sounds and intact distal pulses.   No murmur heard. Pulmonary/Chest: He has no wheezes. He has no rales.  Abdominal: Soft. Bowel sounds are normal.  Musculoskeletal: He exhibits no edema.  Neurological: He displays normal reflexes. No cranial nerve deficit.  Skin: Skin is warm.  Nursing note and vitals reviewed.    LABS:  CBC  Recent Labs Lab 01/11/16 0423 01/12/16 0533 02-11-16 0446  WBC 30.7* 23.6* 20.2*  HGB 7.3* 6.6* 8.5*  HCT 22.3* 20.1* 25.2*  PLT 528* 497* 428   Coag's No results for input(s): APTT, INR in the last 168 hours. BMET  Recent Labs Lab 01/11/16 0423 01/12/16 0533 February 11, 2016 0446  NA 142 142 143  K 4.2 3.6 4.2  CL 104 105 104  CO2 33* 30 33*  BUN 31* 30* 30*  CREATININE 0.87 0.70 1.08  GLUCOSE 202* 100* 116*   Electrolytes  Recent  Labs Lab 01/09/16 0517  01/11/16 0423 01/12/16 0533 Feb 01, 2016 0446  CALCIUM 7.7*  < > 7.6* 7.5* 7.3*  MG 2.0  --   --  1.9  --   PHOS 2.3*  --   --  2.3*  --   < > = values in this interval not displayed. Sepsis Markers  Recent Labs Lab 01/09/16 0517  PROCALCITON 0.57   ABG  Recent Labs Lab 01/08/16 0446 01/10/16 0451 2016/02/01 0700  PHART 7.50* 7.47* 7.40  PCO2ART 48 46 56*  PO2ART 63* 43* 70*   Liver Enzymes No results for input(s): AST, ALT, ALKPHOS, BILITOT, ALBUMIN in the last 168 hours. Cardiac Enzymes No results for  input(s): TROPONINI, PROBNP in the last 168 hours. Glucose  Recent Labs Lab 01/12/16 1142 01/12/16 1635 01/12/16 2022 Feb 01, 2016 0014 Feb 01, 2016 0418 02/01/16 0801  GLUCAP 161* 147* 131* 92 119* 106*    CXR: NSC bilateral alveolar infiltrates  ASSESSMENT / PLAN: 69 yo white male with acute and severe  Hypoxic resp failure from atypical viral pneumonia with ARDS  A: PULMONARY ARDS Influenza A pneumonitis Prolonged VDRF P:  Cont vent support - settings reviewed and/or adjusted PCV-patient tolerates PCV better Use ARDS PEEP/FiO2 algorithm Trial of diuresis 02/26 and follow up CXR 02/27 ENT consult request placed 02/26 for trach tube placement next week  Would like to see on PEEP < 10 for trach tube placmeent  A: CARDIOVASCULAR Minimally elevated troponin - likely demand ischemia Hypertension Sinus tachycardia P:  increase metoprolol to 50 mg q 8 hrs  A: RENAL Positive fluid balance P: Monitor BMET intermittently Monitor I/Os Correct electrolytes  Trial of diuresis   A: GASTROINTESTINAL A: Melena-Resolved Ileus - resolved P: SUP: enteral famotidine Continue tube feeds   A: HEMATOLOGIC Acute drop in Hgb (02/26) P: DVT px: SQ UFH Monitor CBC intermittently Transfuse per usual guidelines  One unit ordered 02/26 hgb over 8 now  INFECTIOUS A: Influenza A Pneumonitis P: Monitor temp, WBC count Micro and abx as above  A: ENDOCRINE A: Steroid induced hyperglycemia, controlled - no prior dx of DM P: Continue SSI - mod scale Continue Lantus  A: NEUROLOGIC ICU/vent associated discomfort P:  RASS goal: -1, -2 PAD protocol - cont fentanyl gtt Transition from propofol to midaz gtt 02/26  The Patient requires high complexity decision making for assessment and support, frequent evaluation and titration of therapies, application of advanced monitoring technologies and extensive interpretation of multiple databases. Critical Care Time devoted to patient  care services described in this note is 40 minutes.   Overall, patient is critically ill, prognosis is guarded.  Patient will need Trach for survival  Lucie Leather, M.D.  [facility]  Medical Director [facility] Medical Director [facility] Cardio-Pulmonary Department

## 2016-01-13 NOTE — Progress Notes (Addendum)
Called [facility] to speak with Dr. De-Dios about sats dropping around 0545 and remaining in the 80s, and ordered blood gas this AM. Called respiratory to have drawn. Also informed MD that pt looks the same as before, but not as responsive. Is not opening his eyes or responding to stimuli.

## 2016-01-13 NOTE — Progress Notes (Signed)
eLink Physician-Brief Progress Note Patient Name: Larry Pacheco DOB: 11-Jul-1946 MRN: [id number]   Date of Service  01/04/2016  HPI/Events of Note  RN calls for HR in 140-150, BP 90 sys. Lopressor PO was ordered earlier but was not given. Not sure if sinus tachycardia making BP low. Pt appeared comfortable.   eICU Interventions  Trial with lopressor 5 mg IV x 1 and see what BP and HR do.      Intervention Category Major Interventions: Arrhythmia - evaluation and management  Daneen Schick Dios 01/12/2016, 2:28 AM

## 2016-01-13 NOTE — Progress Notes (Signed)
PHARMACY - CRITICAL CARE PROGRESS NOTE  Pharmacy Consult for electrolyte management    No Known Allergies  Patient Measurements: Height:  (167.6 cm) Weight: 129 lb 3 oz (58.6 kg) IBW/kg (Calculated) : 63.8   Vital Signs: Temp: 98.9 F (37.2 C) (02/27 0734) Temp Source: Axillary (02/27 0734) BP: 94/57 mmHg (02/27 0824) Pulse Rate: 125 (02/27 0800) Intake/Output from previous day: 02/26 0701 - 02/27 0700 In: 3302.8 [I.V.:1362.8; Blood:320; NG/GT:1620] Out: 5125 [Urine:4975; Stool:150] Intake/Output from this shift: Total I/O In: 741.3 [P.O.:700; I.V.:41.3] Out: -  Vent settings for last 24 hours: Vent Mode:  [-] PCV FiO2 (%):  [70 %-90 %] 90 % Set Rate:  [14 bmp-18 bmp] 18 bmp PEEP:  [10 cmH20-12 cmH20] 10 cmH20 Plateau Pressure:  [31 cmH20] 31 cmH20  Labs:  Recent Labs  01/11/16 0423 01/12/16 0533 02/02/16 0446  WBC 30.7* 23.6* 20.2*  HGB 7.3* 6.6* 8.5*  HCT 22.3* 20.1* 25.2*  PLT 528* 497* 428  CREATININE 0.87 0.70 1.08  MG  --  1.9  --   PHOS  --  2.3*  --    Estimated Creatinine Clearance: 52 mL/min (by C-G formula based on Cr of 1.08).   Recent Labs  02-02-16 0014 02/02/2016 0418 02/02/2016 0801  GLUCAP 92 119* 106*    Microbiology: Recent Results (from the past 720 hour(s))  Rapid Influenza A&B Antigens ([facility] only)     Status: None   Collection Time: 01/01/2016  3:01 PM  Result Value Ref Range Status   Influenza A ([facility]) NEGATIVE  Final   Influenza B ([facility]) NEGATIVE  Final  Blood Culture (routine x 2)     Status: None   Collection Time: 01/09/2016  3:55 PM  Result Value Ref Range Status   Specimen Description BLOOD RIGHT ASSIST CONTROL  Final   Special Requests   Final    BOTTLES DRAWN AEROBIC AND ANAEROBIC  6CC AERO, 5CC ANA   Culture  Setup Time   Final    GRAM POSITIVE COCCI ANAEROBIC BOTTLE ONLY CRITICAL RESULT CALLED TO, READ BACK BY AND VERIFIED WITH: Ihor Austin 01/02/16 1250PM MLM Organism ID to follow    Culture   Final   STAPHYLOCOCCUS SACCHAROLYTICUS ANAEROBIC BOTTLE ONLY Results consistent with contamination.    Report Status 01/05/2016 FINAL  Final  Blood Culture ID Panel (Reflexed)     Status: None   Collection Time: 01/05/2016  3:55 PM  Result Value Ref Range Status   Enterococcus species NOT DETECTED NOT DETECTED Final   Listeria monocytogenes NOT DETECTED NOT DETECTED Final   Staphylococcus species NOT DETECTED NOT DETECTED Final   Staphylococcus aureus NOT DETECTED NOT DETECTED Final   Streptococcus species NOT DETECTED NOT DETECTED Final   Streptococcus agalactiae NOT DETECTED NOT DETECTED Final   Streptococcus pneumoniae NOT DETECTED NOT DETECTED Final   Streptococcus pyogenes NOT DETECTED NOT DETECTED Final   Acinetobacter baumannii NOT DETECTED NOT DETECTED Final   Enterobacteriaceae species NOT DETECTED NOT DETECTED Final   Enterobacter cloacae complex NOT DETECTED NOT DETECTED Final   Escherichia coli NOT DETECTED NOT DETECTED Final   Klebsiella oxytoca NOT DETECTED NOT DETECTED Final   Klebsiella pneumoniae NOT DETECTED NOT DETECTED Final   Proteus species NOT DETECTED NOT DETECTED Final   Serratia marcescens NOT DETECTED NOT DETECTED Final   Haemophilus influenzae NOT DETECTED NOT DETECTED Final   Neisseria meningitidis NOT DETECTED NOT DETECTED Final   Pseudomonas aeruginosa NOT DETECTED NOT DETECTED Final   Candida albicans NOT DETECTED NOT  DETECTED Final   Candida glabrata NOT DETECTED NOT DETECTED Final   Candida krusei NOT DETECTED NOT DETECTED Final   Candida parapsilosis NOT DETECTED NOT DETECTED Final   Candida tropicalis NOT DETECTED NOT DETECTED Final   Carbapenem resistance NOT DETECTED NOT DETECTED Final   Methicillin resistance NOT DETECTED NOT DETECTED Final   Vancomycin resistance NOT DETECTED NOT DETECTED Final  Blood Culture (routine x 2)     Status: None   Collection Time: 01/14/2016  4:01 PM  Result Value Ref Range Status   Specimen Description BLOOD LEFT ASSIST  CONTROL  Final   Special Requests   Final    BOTTLES DRAWN AEROBIC AND ANAEROBIC  3CC AERO, 2CC ANA   Culture NO GROWTH 5 DAYS  Final   Report Status 01/04/2016 FINAL  Final  Urine culture     Status: None   Collection Time: 12/31/2015  6:36 PM  Result Value Ref Range Status   Specimen Description URINE, RANDOM  Final   Special Requests NONE  Final   Culture NO GROWTH 1 DAY  Final   Report Status 01/01/2016 FINAL  Final  MRSA PCR Screening     Status: None   Collection Time: 01/01/16  2:08 AM  Result Value Ref Range Status   MRSA by PCR NEGATIVE NEGATIVE Final    Comment:        The GeneXpert MRSA Assay (FDA approved for NASAL specimens only), is one component of a comprehensive MRSA colonization surveillance program. It is not intended to diagnose MRSA infection nor to guide or monitor treatment for MRSA infections.   Gastrointestinal Panel by PCR , Stool     Status: None   Collection Time: 01/01/16  6:59 AM  Result Value Ref Range Status   Campylobacter species NOT DETECTED NOT DETECTED Final   Plesimonas shigelloides NOT DETECTED NOT DETECTED Final   Salmonella species NOT DETECTED NOT DETECTED Final   Yersinia enterocolitica NOT DETECTED NOT DETECTED Final   Vibrio species NOT DETECTED NOT DETECTED Final   Vibrio cholerae NOT DETECTED NOT DETECTED Final   Enteroaggregative E coli (EAEC) NOT DETECTED NOT DETECTED Final   Enteropathogenic E coli (EPEC) NOT DETECTED NOT DETECTED Final   Enterotoxigenic E coli (ETEC) NOT DETECTED NOT DETECTED Final   Shiga like toxin producing E coli (STEC) NOT DETECTED NOT DETECTED Final   E. coli O157 NOT DETECTED NOT DETECTED Final   Shigella/Enteroinvasive E coli (EIEC) NOT DETECTED NOT DETECTED Final   Cryptosporidium NOT DETECTED NOT DETECTED Final   Cyclospora cayetanensis NOT DETECTED NOT DETECTED Final   Entamoeba histolytica NOT DETECTED NOT DETECTED Final   Giardia lamblia NOT DETECTED NOT DETECTED Final   Adenovirus F40/41 NOT  DETECTED NOT DETECTED Final   Astrovirus NOT DETECTED NOT DETECTED Final   Norovirus GI/GII NOT DETECTED NOT DETECTED Final   Rotavirus A NOT DETECTED NOT DETECTED Final   Sapovirus (I, II, IV, and V) NOT DETECTED NOT DETECTED Final  C difficile quick scan w PCR reflex     Status: None   Collection Time: 01/01/16  6:59 AM  Result Value Ref Range Status   C Diff antigen NEGATIVE NEGATIVE Final   C Diff toxin NEGATIVE NEGATIVE Final   C Diff interpretation Negative for C. difficile  Final  Culture, expectorated sputum-assessment     Status: None   Collection Time: 01/01/16 10:46 AM  Result Value Ref Range Status   Specimen Description SPUTUM  Final   Special Requests Normal  Final  Sputum evaluation THIS SPECIMEN IS ACCEPTABLE FOR SPUTUM CULTURE  Final   Report Status 01/01/2016 FINAL  Final  Culture, respiratory (NON-Expectorated)     Status: None   Collection Time: 01/01/16 10:46 AM  Result Value Ref Range Status   Specimen Description SPUTUM  Final   Special Requests Normal Reflexed from [id number]  Final   Gram Stain   Final    FAIR SPECIMEN - 70-80% WBCS FEW SQUAMOUS EPITHELIAL CELLS PRESENT MODERATE WBC SEEN MODERATE YEAST FEW GRAM NEGATIVE RODS RARE GRAM POSITIVE COCCI IN PAIRS    Culture LIGHT GROWTH CANDIDA ALBICANS  Final   Report Status 01/04/2016 FINAL  Final  Fungus Culture with Smear     Status: None (Preliminary result)   Collection Time: 01/02/16  2:52 PM  Result Value Ref Range Status   Specimen Description BRONCHIAL WASHINGS  Final   Special Requests Normal  Final   Culture CANDIDA ALBICANS  Final   Report Status PENDING  Incomplete  Culture, bal-quantitative     Status: None   Collection Time: 01/02/16  2:52 PM  Result Value Ref Range Status   Specimen Description BRONCHIAL WASHINGS  Final   Special Requests Normal  Final   Gram Stain   Final    FAIR SPECIMEN - 70-80% WBCS FEW SQUAMOUS EPITHELIAL CELLS PRESENT MODERATE WBC SEEN MANY YEAST    Culture  HEAVY GROWTH CANDIDA ALBICANS  Final   Report Status 01/06/2016 FINAL  Final  Virus culture     Status: Abnormal   Collection Time: 01/02/16  2:52 PM  Result Value Ref Range Status   Viral Culture Comment (A)  Final    Comment: (NOTE) Positive Influenza A detected. Performed At: [facility] [address] [place], [place] [id number] Mila Homer MD BJ:[id number]    Source of Sample BRONCHIAL WASHINGS  Final  Culture, blood (Routine X 2) w Reflex to ID Panel     Status: None   Collection Time: 01/06/16 10:42 AM  Result Value Ref Range Status   Specimen Description BLOOD RIGHT ASSIST CONTROL  Final   Special Requests BOTTLES DRAWN AEROBIC AND ANAEROBIC 7CCAERO,7CCANA  Final   Culture NO GROWTH 5 DAYS  Final   Report Status 01/11/2016 FINAL  Final  Culture, blood (Routine X 2) w Reflex to ID Panel     Status: None   Collection Time: 01/06/16 10:51 AM  Result Value Ref Range Status   Specimen Description BLOOD LEFT ASSIST CONTROL  Final   Special Requests BOTTLES DRAWN AEROBIC AND ANAEROBIC 5CCAERO,5CCANA  Final   Culture NO GROWTH 5 DAYS  Final   Report Status 01/11/2016 FINAL  Final  Culture, respiratory (NON-Expectorated)     Status: None (Preliminary result)   Collection Time: 01/06/16 12:08 PM  Result Value Ref Range Status   Specimen Description TRACHEAL ASPIRATE  Final   Special Requests NONE  Final   Gram Stain   Final    FEW WBC SEEN MANY YEAST GOOD SPECIMEN - 80-90% WBCS    Culture   Final    HEAVY GROWTH CANDIDA ALBICANS MOLD SENT TO STATE FOR IDENTIFICATION    Report Status PENDING  Incomplete  Culture, expectorated sputum-assessment     Status: None   Collection Time: 01/09/16  3:09 PM  Result Value Ref Range Status   Specimen Description TRACHEAL ASPIRATE  Final   Special Requests Normal  Final   Sputum evaluation THIS SPECIMEN IS ACCEPTABLE FOR SPUTUM CULTURE  Final   Report Status 01/09/2016 FINAL  Final  Culture, respiratory  (NON-Expectorated)     Status: None (Preliminary result)   Collection Time: 01/09/16  3:09 PM  Result Value Ref Range Status   Specimen Description TRACHEAL ASPIRATE  Final   Special Requests Normal Reflexed from [id number]  Final   Gram Stain FEW WBC SEEN MODERATE YEAST   Final   Culture PENDING  Incomplete   Report Status PENDING  Incomplete    Medications:  Scheduled:  . antiseptic oral rinse  7 mL Mouth Rinse QID  . chlorhexidine gluconate  15 mL Mouth Rinse BID  . famotidine  20 mg Per Tube BID  . feeding supplement (PRO-STAT SUGAR FREE 64)  30 mL Per Tube Daily  . free water  200 mL Per Tube 3 times per day  . heparin subcutaneous  5,000 Units Subcutaneous Q12H  . insulin aspart  0-15 Units Subcutaneous 6 times per day  . insulin glargine  10 Units Subcutaneous Daily  . metoprolol  5 mg Intravenous Once  . metoprolol tartrate  50 mg Per Tube Q8H  . oseltamivir  75 mg Per Tube BID  . pantoprazole (PROTONIX) IV  40 mg Intravenous Q12H  . QUEtiapine  100 mg Oral QHS  . sodium chloride flush  10-40 mL Intracatheter Q12H   Infusions:  . sodium chloride 10 mL/hr at 2016-01-19 0700  . feeding supplement (VITAL AF 1.2 CAL) 1,000 mL (01-19-2016 0700)  . fentaNYL infusion INTRAVENOUS 180 mcg/hr (Jan 19, 2016 0944)  . midazolam (VERSED) infusion 2 mg/hr (19-Jan-2016 0734)    Assessment: Pharmacy consulted for electrolyte management for 70 yo male requiring mechanical ventilation and receiving tube feeds, Vital AF, at 22mL/hr.     Plan:  No replacement warranted at this time. Will obtain follow up electrolytes with am labs.   Pharmacy will continue to monitor and adjust per consult.   Nickisha Hum L 2016-01-19,11:34 AM

## 2016-01-13 NOTE — Progress Notes (Signed)
BP dropped to  80/50-60s. Hr 130-140s. Oxygenation also dropped to high 80s. Contacted respiratory but  Does not want to turn up at this time.

## 2016-01-13 NOTE — Progress Notes (Signed)
Nutrition Follow-up    INTERVENTION:   EN: off diprivan, recommend increasing Vital 1.2 AF to rate of 45 ml/hr, continue Prostat daily; meets 100% estimated calorie and protein needs; continue to assess   NUTRITION DIAGNOSIS:   Inadequate oral intake related to acute illness as evidenced by meal completion < 50%.  GOAL:   Patient will meet greater than or equal to 90% of their needs  MONITOR:    (Energy Intake, Anthropometrics, Digestive System)  REASON FOR ASSESSMENT:   Consult Enteral/tube feeding initiation and management  ASSESSMENT:    Pt remains on vent  EN: tolerating Vital AF 1.2 at rate of 40 ml/hr, Prostat  Digestive System: +stool, rectal tube, no signs of TF intolerance  Glucose Profile:  Recent Labs  01/01/2016 0418 12/23/2015 0801 01/04/2016 1152  GLUCAP 119* 106* 108*   Electrolyte and Renal Profile:  Recent Labs Lab 01/09/16 0517  01/11/16 0423 01/12/16 0533 12/31/2015 0446  BUN 30*  < > 31* 30* 30*  CREATININE 0.89  < > 0.87 0.70 1.08  NA 144  < > 142 142 143  K 4.0  < > 4.2 3.6 4.2  MG 2.0  --   --  1.9  --   PHOS 2.3*  --   --  2.3*  --   < > = values in this interval not displayed. Meds: ss novolog, lasix, lantus, potassium chloride, senokot, off diprivan  Skin:  Reviewed, no issues  Last BM:  01/01/16  Height:   Ht Readings from Last 1 Encounters:  12/19/2015  (1.676 m)    Weight:   Wt Readings from Last 1 Encounters:  12/29/2015 129 lb 3 oz (58.6 kg)    BMI:  Body mass index is 20.86 kg/(m^2).  Estimated Nutritional Needs:   Kcal:  1468 kcals (BEE 1243, Ve: 5.1, Tmax: 37.9) using wt of 55 kg  Protein:  83-110 g (1.5-2.0 g/kg)   Fluid:  1375-1650 mL (25-30 ml/kg)   HIGH Care Level  Romelle Starcher MS, RD, LDN [phone] Pager  [phone] Weekend/On-Call Pager

## 2016-01-13 NOTE — Progress Notes (Signed)
[facility] [place] [facility]   PATIENT NAME: Larry Pacheco    MR#:  [id number]  DATE OF BIRTH:  12/03/1945  SUBJECTIVE:  Unable to obtain-patient on vent Febrile overnight REVIEW OF SYSTEMS:  nable to obtain given patient's mental status medical condition  DRUG ALLERGIES:  No Known Allergies  VITALS:  Blood pressure 141/85, pulse 119, temperature 98.1 F (36.7 C), temperature source Axillary, resp. rate 21, height  (1.676 m), weight 58.6 kg (129 lb 3 oz), SpO2 75 %.  PHYSICAL EXAMINATION:   VITAL SIGNS: Filed Vitals:   12/23/2015 1143 12/24/2015 1200  BP:  141/85  Pulse:  119  Temp: 98.1 F (36.7 C)   Resp:  21   GENERAL:70 y.o.male critically ill intubated mechanical ventilation HEAD: Normocephalic, atraumatic.  EYES: Pupils equal, round, reactive to light. Unable to assess extraocular muscles given mental status/medical condition. No scleral icterus.  MOUTH: Moist mucosal membrane. Dentition intact. No abscess noted.  EAR, NOSE, THROAT: Clear without exudates. No external lesions.  NECK: Supple. No thyromegaly. No nodules. No JVD.  PULMONARY: diffuse coarse breath sounds without wheeze rails or rhonci. No use of accessory muscles, mechanical venlation CHEST: Nontender to palpation.  CARDIOVASCULAR: S1 and S2. Regular rate and rhythm. No murmurs, rubs, or gallops. No edema. Pedal pulses 2+ bilaterally.  GASTROINTESTINAL: Soft, nontender, nondistended. No masses. Positive bowel sounds. No hepatosplenomegaly.  MUSCULOSKELETAL: No swelling, clubbing, or edema. Passive Range of motion full in all extremities.  NEUROLOGIC: Unable to assess given mental status/medical condition SKIN: No ulceration, lesions, rashes, or cyanosis. Skin warm and dry. Turgor intact.  PSYCHIATRIC: Unable to assess given mental status/medical condition       LABORATORY PANEL:   CBC  Recent Labs Lab 01/12/2016 0446  WBC 20.2*  HGB 8.5*  HCT 25.2*  PLT 428    ------------------------------------------------------------------------------------------------------------------  Chemistries   Recent Labs Lab 01/12/16 0533 01/09/2016 0446  NA 142 143  K 3.6 4.2  CL 105 104  CO2 30 33*  GLUCOSE 100* 116*  BUN 30* 30*  CREATININE 0.70 1.08  CALCIUM 7.5* 7.3*  MG 1.9  --    ------------------------------------------------------------------------------------------------------------------  Cardiac Enzymes No results for input(s): TROPONINI in the last 168 hours. ------------------------------------------------------------------------------------------------------------------  RADIOLOGY:  Dg Chest Port 1 View  12/18/2015  CLINICAL DATA:  Hypoxia EXAM: PORTABLE CHEST 1 VIEW COMPARISON:  January 12, 2016 FINDINGS: Endotracheal tube tip is 5.1 cm above the carina. Central catheter tip is in the superior vena cava. Nasogastric tube tip and side port are in the stomach. No pneumothorax. Widespread interstitial and patchy alveolar edema remain throughout the lungs, stable. No new opacity. Heart is upper normal in size with pulmonary vascularity within normal limits. No adenopathy evident. IMPRESSION: Tube and catheter positions as described without pneumothorax. Widespread interstitial and alveolar opacity remain, likely edema, although superimposed pneumonia cannot be excluded. No change in cardiac silhouette. No new opacity is seen. Electronically Signed   By: Bretta Bang III M.D.   On: 12/31/2015 07:24   Dg Chest Port 1 View  01/12/2016  CLINICAL DATA:  Patient status post PICC line placement. EXAM: PORTABLE CHEST 1 VIEW COMPARISON:  Chest radiograph earlier same day. FINDINGS: ET tube terminates in the mid trachea. Right IJ central venous catheter tip projects at the superior cavoatrial junction. Left upper extremity PICC line has been retracted with tip projecting at the superior cavoatrial junction. Enteric tube courses inferior to the diaphragm.  Stable cardiac and mediastinal contours. Unchanged diffuse  bilateral airspace opacities. No pleural effusion or pneumothorax. IMPRESSION: Interval retraction left upper extremity PICC line with tip projecting over the superior cavoatrial junction. Remainder of the support apparatus are stable. Unchanged diffuse bilateral airspace opacities. Electronically Signed   By: Annia Belt M.D.   On: 01/12/2016 16:30   Dg Chest Port 1 View  01/12/2016  CLINICAL DATA:  PICC line placement EXAM: PORTABLE CHEST 1 VIEW COMPARISON:  01/12/2016 FINDINGS: Endotracheal tube and right central line are stable. Interval placement of left PICC line with the tip in the upper right atrium approximately 3 cm below the IVC right atrial junction. Diffuse opacities throughout the lungs, not significantly changed. Heart is normal size. No visible effusions. IMPRESSION: Mental placement of left PICC line with the tip in the upper right atrium approximately 3 cm below the cavoatrial junction. Stable diffuse bilateral airspace disease. Electronically Signed   By: Charlett Nose M.D.   On: 01/12/2016 15:30   Dg Chest Port 1 View  01/12/2016  CLINICAL DATA:  70 year old male with respiratory failure. EXAM: PORTABLE CHEST 1 VIEW COMPARISON:  01/11/2016 and prior exams FINDINGS: An endotracheal tube with tip 5.2 cm above the carina, right IJ central venous catheter with tip overlying the lower SVC, enteric tube entering the stomach with tip off the field of view again noted. Bilateral airspace opacities are unchanged. No large pleural effusions or pneumothorax identified. IMPRESSION: Unchanged appearance of the chest with continued bilateral airspace opacities Electronically Signed   By: Harmon Pier M.D.   On: 01/12/2016 07:38    EKG:   Orders placed or performed during the hospital encounter of Jan 03, 2016  . EKG 12-Lead  . EKG 12-Lead  . EKG 12-Lead  . EKG 12-Lead  . ED EKG 12-Lead  . ED EKG 12-Lead  . ED EKG  . ED EKG  . EKG 12-Lead   . EKG 12-Lead  . EKG 12-Lead  . EKG 12-Lead    ASSESSMENT AND PLAN:   70 year old Caucasian gentleman admitted 01-03-16  1. sepsis with pneumonia bilaterally increasing leukocytosis. Influenza positive on viral culture continue Tamiflu off antibiotics Given fever will follow chest x-ray, check urinalysis and further etiology of infection 2. Blood loss anemia: Hemoglobin stable on Protonix  3. Acute respiratory failure with hypoxia full vent support wean as tolerated, wean sedation as tolerated and Seroquel at nighttime to hopefully aid in this process 4. Nutrition - continue tube feeds.  5. Essential hypertension, tachycardia- patient started on metoprolol and Cardizem. 6. Hypokalemia, hypophosphatemia- replaced  Disposition: LTAC , ENT evaluation for tracheostomy  All the records are reviewed and case discussed with Care Management/Social Workerr. Management plans discussed with the patient, family and they are in agreement.  CODE STATUS: full  TOTAL TIME TAKING CARE OF THIS PATIENT: 33 minutes.   POSSIBLE D/C IN 3-4 DAYS, DEPENDING ON CLINICAL CONDITION.   Genessis Flanary,  Mardi Mainland.D on 12/30/2015 at 2:12 PM  Between 7am to 6pm - Pager - [phone]  After 6pm: House Pager: - [phone]  [facility]  Office  [phone]  CC: Primary care physician; No primary care provider on file.

## 2016-01-13 NOTE — Progress Notes (Signed)
Patient respiratory rate increased in the 40s and O2 sat 82. New order received for propofol and versed drip discontinued.

## 2016-01-13 NOTE — Consult Note (Signed)
..   Larry Pacheco, Larry Pacheco [id number] [birth year] Larry Haste, MD  Reason for Consult: respiratory failure  HPI: 70 y.o. Male admitted for hemoptysis with rapid respiratory decline.  Diagnosed with influenza A and pneumonia.  Prolonged ventilation as well as limited response when attempted arousal.  Consulted for evaluation for tracheostomy tube placement.  Allergies: No Known Allergies  ROS: Review of systems normal other than 12 systems except per HPI.  PMH: History reviewed. No pertinent past medical history.  FH:  Family History  Problem Relation Age of Onset  . Bowel Disease Father     SH:  Social History   Social History  . Marital Status: Single    Spouse Name: N/A  . Number of Children: N/A  . Years of Education: N/A   Occupational History  . Not on file.   Social History Main Topics  . Smoking status: Never Smoker   . Smokeless tobacco: Not on file  . Alcohol Use: Not on file  . Drug Use: Not on file  . Sexual Activity: Not on file   Other Topics Concern  . Not on file   Social History Narrative    PSH: No past surgical history on file.  Physical  Exam:  GEN- Sedated and intubated EARS- external ears WNL NOSE- clear anteriorly OC/OP- ETT in place and secure NECK-thin neck with good thyroid landmarks   A/P: Pneumonia with failure to wean from vent and need for long term ventilation  Plan:  Patient is a tracheostomy tube candidate.  Reviewed pulm notes.  Patient at FiO2 70% and PEEP 10 currently.  Please obtain consent from family for tracheostomy tube placement.  Hold anticoagulation and Tube feeds night prior to procedure.  Will check schedule and call back for date of procedure this week.   Larry Pacheco Feb 04, 2016 7:52 AM

## 2016-01-14 ENCOUNTER — Inpatient Hospital Stay: Payer: Medicare Other

## 2016-01-14 DIAGNOSIS — J9600 Acute respiratory failure, unspecified whether with hypoxia or hypercapnia: Secondary | ICD-10-CM

## 2016-01-14 DIAGNOSIS — L8990 Pressure ulcer of unspecified site, unspecified stage: Secondary | ICD-10-CM | POA: Insufficient documentation

## 2016-01-14 DIAGNOSIS — J80 Acute respiratory distress syndrome: Secondary | ICD-10-CM

## 2016-01-14 LAB — BASIC METABOLIC PANEL
ANION GAP: 8 (ref 5–15)
ANION GAP: 10 (ref 5–15)
BUN: 49 mg/dL — AB (ref 6–20)
BUN: 54 mg/dL — ABNORMAL HIGH (ref 6–20)
CALCIUM: 7.2 mg/dL — AB (ref 8.9–10.3)
CALCIUM: 7 mg/dL — AB (ref 8.9–10.3)
CO2: 31 mmol/L (ref 22–32)
CO2: 30 mmol/L (ref 22–32)
CREATININE: 2.22 mg/dL — AB (ref 0.61–1.24)
Chloride: 103 mmol/L (ref 101–111)
Chloride: 98 mmol/L — ABNORMAL LOW (ref 101–111)
Creatinine, Ser: 1.86 mg/dL — ABNORMAL HIGH (ref 0.61–1.24)
GFR calc Af Amer: 40 mL/min — ABNORMAL LOW (ref >60)
GFR, EST AFRICAN AMERICAN: 33 mL/min — AB (ref >60)
GFR, EST NON AFRICAN AMERICAN: 35 mL/min — AB (ref >60)
GFR, EST NON AFRICAN AMERICAN: 28 mL/min — AB (ref >60)
Glucose, Bld: 104 mg/dL — ABNORMAL HIGH (ref 65–99)
Glucose, Bld: 365 mg/dL — ABNORMAL HIGH (ref 65–99)
POTASSIUM: 5 mmol/L (ref 3.5–5.1)
Potassium: 4.4 mmol/L (ref 3.5–5.1)
SODIUM: 142 mmol/L (ref 135–145)
SODIUM: 138 mmol/L (ref 135–145)

## 2016-01-14 LAB — CBC
HEMATOCRIT: 22.1 % — AB (ref 40.0–52.0)
Hemoglobin: 7.2 g/dL — ABNORMAL LOW (ref 13.0–18.0)
MCH: 30.4 pg (ref 26.0–34.0)
MCHC: 32.4 g/dL (ref 32.0–36.0)
MCV: 93.6 fL (ref 80.0–100.0)
PLATELETS: 295 10*3/uL (ref 150–440)
RBC: 2.36 MIL/uL — ABNORMAL LOW (ref 4.40–5.90)
RDW: 15.5 % — AB (ref 11.5–14.5)
WBC: 23 10*3/uL — AB (ref 3.8–10.6)

## 2016-01-14 LAB — GLUCOSE, CAPILLARY
GLUCOSE-CAPILLARY: 101 mg/dL — AB (ref 65–99)
GLUCOSE-CAPILLARY: 128 mg/dL — AB (ref 65–99)
GLUCOSE-CAPILLARY: 134 mg/dL — AB (ref 65–99)
GLUCOSE-CAPILLARY: 144 mg/dL — AB (ref 65–99)
GLUCOSE-CAPILLARY: 60 mg/dL — AB (ref 65–99)
Glucose-Capillary: 108 mg/dL — ABNORMAL HIGH (ref 65–99)
Glucose-Capillary: 176 mg/dL — ABNORMAL HIGH (ref 65–99)
Glucose-Capillary: 151 mg/dL — ABNORMAL HIGH (ref 65–99)

## 2016-01-14 LAB — BLOOD GAS, ARTERIAL
ALLENS TEST (PASS/FAIL): POSITIVE — AB
Acid-Base Excess: 5.5 mmol/L — ABNORMAL HIGH (ref 0.0–3.0)
Bicarbonate: 34.8 mEq/L — ABNORMAL HIGH (ref 21.0–28.0)
FIO2: 100
LHR: 24 {breaths}/min
MECHVT: 450 mL
O2 SAT: 50.5 %
PATIENT TEMPERATURE: 37
PCO2 ART: 89 mmHg — AB (ref 32.0–48.0)
PEEP: 20 cmH2O
PO2 ART: 34 mmHg — AB (ref 83.0–108.0)
pH, Arterial: 7.2 — ABNORMAL LOW (ref 7.350–7.450)

## 2016-01-14 LAB — TROPONIN I
TROPONIN I: 0.7 ng/mL — AB (ref <0.031)
Troponin I: 0.25 ng/mL — ABNORMAL HIGH (ref <0.031)

## 2016-01-14 LAB — MAGNESIUM: MAGNESIUM: 2.8 mg/dL — AB (ref 1.7–2.4)

## 2016-01-14 LAB — PREPARE RBC (CROSSMATCH)

## 2016-01-14 LAB — PHOSPHORUS: PHOSPHORUS: 7.7 mg/dL — AB (ref 2.5–4.6)

## 2016-01-14 MED ORDER — NOREPINEPHRINE BITARTRATE 1 MG/ML IV SOLN
0.0000 ug/min | INTRAVENOUS | Status: DC
  Administered 2016-01-14: 10 ug/min via INTRAVENOUS
  Administered 2016-01-14: 24 ug/min via INTRAVENOUS
  Filled 2016-01-14 (×2): qty 4

## 2016-01-14 MED ORDER — NOREPINEPHRINE BITARTRATE 1 MG/ML IV SOLN
0.0000 ug/min | INTRAVENOUS | Status: DC
  Administered 2016-01-14: 12 ug/min via INTRAVENOUS
  Administered 2016-01-14: 14 ug/min via INTRAVENOUS
  Administered 2016-01-15: 7 ug/min via INTRAVENOUS
  Filled 2016-01-14 (×2): qty 16

## 2016-01-14 MED ORDER — SODIUM CHLORIDE 0.9 % IV SOLN
Freq: Once | INTRAVENOUS | Status: AC
  Administered 2016-01-14: 15:00:00 via INTRAVENOUS

## 2016-01-14 MED ORDER — OSELTAMIVIR PHOSPHATE 30 MG PO CAPS
30.0000 mg | ORAL_CAPSULE | Freq: Every day | ORAL | Status: DC
  Administered 2016-01-15: 30 mg
  Filled 2016-01-14: qty 1

## 2016-01-14 NOTE — Progress Notes (Signed)
Patient made DNR this afternoon per POA-sister. Patient received 1 unit of blood. Urine output 375 for shift. OG hooked to LIS with scant output. No diarrhea noted for shift. Titrated levophed as BP tolerates. Fentanyl and prop infusing. Vec given for patients "guppie breathing" throughout shift. Continue to monitor.

## 2016-01-14 NOTE — Progress Notes (Signed)
Per Dr Belia Heman will hold tube feeds and hook up OG to LIS.

## 2016-01-14 NOTE — Progress Notes (Signed)
Spoke with Dr Belia Heman regarding patient not responding to painful stimuli- Stopped prop. Per Dr Belia Heman turned propofol back on. Heart rate getting into the 140's and patient is having some labored breathing. Orders to call MD if heart rate gets into the 150's and sustains. Orders to give VEC now. No additional orders at this time

## 2016-01-14 NOTE — Progress Notes (Signed)
Chaplain rounded the unit and provided a compassionate presence, support and silent prayer.. Chaplain Shadasia Oldfield [phone] 

## 2016-01-14 NOTE — Progress Notes (Signed)
Per Dr. Clovis Fredrickson and the CXR  the ETT was advanced 3 cm to 21 cm at the lip.

## 2016-01-14 NOTE — Progress Notes (Signed)
Patients BP dropping, diaphoretic, heart rate fluctuating. Dr Belia Heman called to bedside.Code called 9:26am no pulse found at this point  Compressions, levophed and fluids started. Pulse regained after meds given and CPR intitiated. Labs drawn, blood sugar checked, fentanyl restarted. Dr Belia Heman calling family for update.

## 2016-01-14 NOTE — Consult Note (Signed)
PATIENT NAME: Duane Earnshaw MEDICAL RECORD NUMBER: [id number] Birthday: June 25, 1946  Age: 70 y.o. Admit Date: 01/08/2016    Indication: cardiac arrest  Technical Description:patient with ARDS, already on vent, s/p cardiac arrest   CPR performance duration: 5 mins  Was defibrillation or cardioversion used ? No  Medications Administered Include      Yes/no Amiodarone   Atropin   Calcium   Epinephrine y  Lidocaine   Magnesium y  Norepinephrine   Phenylephrine   Sodium bicarbonate y  Vasopression    Evaluation  Final Status - Was patient successfully resuscitated ? Yes If successfully resuscitated - what is current rhythm ? ST If successfully resuscitated - what is current hemodynamic status ?unstable    Lucie Leather, M.D.  [facility]  Medical Director [facility] Medical Director [facility] Cardio-Pulmonary Department

## 2016-01-14 NOTE — Progress Notes (Signed)
PULMONARY / CRITICAL CARE MEDICINE   Name: Larry Pacheco MRN: [id number] DOB: 02/06/1946    ADMISSION DATE:  12/29/2015  BRIEF HISTORY: 70 year old male  admitted on 12/30/2011 for shortness of breath, and intermittent hemoptysis, started experiencing shortness of hemoptysis along with cough about one week ago after going out to the [place] to bird watch.  CTA chest negative for pulmonary embolus. Acute decline in respiratory status from nasal cannula to high flow oxygen to requiring intubation.   SUBJECTIVE:  Agitated on WUA. RASS -3, not F/C. Tachypnea. Tachycardia, given VEC for increased WOB Patient remains on high fio2 increased to 90% yesterday PEEP at 10, remains intubated,sedated VENT DAY 12 ENT CONSULTED FOR [facility]  STUDIES:  02/12 CT chest: no PE, multilobar basilar pneumonia, mildly tight findings, pneumonitis 02/16 TTE: LVEF 55-60% 02/22 CT chest: Diffuse pulmonary airspace disease and small bilateral effusions and mild mediastinal lymphadenopathy   Micro: MRSA PCR 02/15 >>NEG Rapid flu 02/13 >> NEG Blood 2/13 >> 1/2 staph saccharolyticus (likely contaminant) Urine 02/13 >> NEG C diff 02/15 >> NEG Resp 02/15 >> light growth candida BAL 2/16 >> heavy growth yeast (candida albicans) and influenza A HIV 02/20 >> NEG Resp 02/20 >> Moderate yeast Pneumocystis DFA 02/20 >> NEG Legionella DFA 02/20 >> NEG   Antibiotics: Azithro 02/13 >> 02/19 Ceftriaxone 02/13 >> 02/15 Vanc 2/15 >> 02/18 Zosyn 2/15 >>02/22  Voriconazole 2/22 >> 02/25 Doxycycline 02/23 >> 02/25 Tamiflu 02/25 >>   Lines: ETT 02/15 >>  R IJ CVL 02/16 >> 02/26 (removal ordered) PICC (ordered) 02/26 >>      VITAL SIGNS: Temp:  [96 F (35.6 C)-99.6 F (37.6 C)] 99.6 F (37.6 C) (02/28 0725) Pulse Rate:  [74-138] 138 (02/28 0725) Resp:  [12-37] 23 (02/28 0725) BP: (71-158)/(46-99) 103/63 mmHg (02/28 0700) SpO2:  [75 %-96 %] 91 % (02/28 0725) FiO2 (%):  [90 %-100 %] 100 % (02/28 0315) Weight:   [124 lb 12.5 oz (56.6 kg)] 124 lb 12.5 oz (56.6 kg) (02/28 0500) HEMODYNAMICS:   VENTILATOR SETTINGS: Vent Mode:  [-] PCV FiO2 (%):  [90 %-100 %] 100 % Set Rate:  [14 bmp] 14 bmp PEEP:  [10 cmH20] 10 cmH20 INTAKE / OUTPUT:  Intake/Output Summary (Last 24 hours) at 01/14/16 0838 Last data filed at 01/14/16 0725  Gross per 24 hour  Intake 2358.12 ml  Output   1150 ml  Net 1208.12 ml    Review of Systems  Unable to perform ROS: intubated    Physical Exam  HENT:  Head: Normocephalic.  Eyes: Pupils are equal, round, and reactive to light.  Neck: No JVD present.  Cardiovascular: Regular rhythm, normal heart sounds and intact distal pulses.   No murmur heard. Pulmonary/Chest: He has no wheezes. He has no rales.  Abdominal: Soft. Bowel sounds are normal.  Musculoskeletal: He exhibits no edema.  Neurological: He displays normal reflexes. No cranial nerve deficit.  Skin: Skin is warm.  Nursing note and vitals reviewed.    LABS:  CBC  Recent Labs Lab 01/11/16 0423 01/12/16 0533 12/19/2015 0446  WBC 30.7* 23.6* 20.2*  HGB 7.3* 6.6* 8.5*  HCT 22.3* 20.1* 25.2*  PLT 528* 497* 428   Coag's No results for input(s): APTT, INR in the last 168 hours. BMET  Recent Labs Lab 01/12/16 0533 12/29/2015 0446 01/14/16 0453  NA 142 143 142  K 3.6 4.2 5.0  CL 105 104 103  CO2 30 33* 31  BUN 30* 30* 49*  CREATININE 0.70 1.08 1.86*  GLUCOSE 100* 116* 104*   Electrolytes  Recent Labs Lab 01/09/16 0517  01/12/16 0533 12/25/2015 0446 01/14/16 0453  CALCIUM 7.7*  < > 7.5* 7.3* 7.2*  MG 2.0  --  1.9  --   --   PHOS 2.3*  --  2.3*  --   --   < > = values in this interval not displayed. Sepsis Markers  Recent Labs Lab 01/09/16 0517  PROCALCITON 0.57   ABG  Recent Labs Lab 01/08/16 0446 01/10/16 0451 12/27/2015 0700  PHART 7.50* 7.47* 7.40  PCO2ART 48 46 56*  PO2ART 63* 43* 70*   Liver Enzymes No results for input(s): AST, ALT, ALKPHOS, BILITOT, ALBUMIN in the  last 168 hours. Cardiac Enzymes No results for input(s): TROPONINI, PROBNP in the last 168 hours. Glucose  Recent Labs Lab 01/12/2016 1152 12/18/2015 1506 01/06/2016 1951 01/14/16 0025 01/14/16 0345 01/14/16 0750  GLUCAP 108* 143* 201* 144* 108* 128*    CXR: NSC bilateral alveolar infiltrates  ASSESSMENT / PLAN: 70 yo white male with acute and severe  Hypoxic resp failure from atypical viral pneumonia with ARDS  A: PULMONARY ARDS Influenza A pneumonitis Prolonged VDRF P:  Cont vent support - settings reviewed and/or adjusted PCV Use ARDS PEEP/FiO2 algorithm ENT consult request placed 02/26 for trach tube placement this  week  Would like to see on PEEP < 10 for trach tube placmeent  A: CARDIOVASCULAR Minimally elevated troponin - likely demand ischemia Hypertension Sinus tachycardia P:  -vasopressors as needed  A: RENAL Positive fluid balance P: Monitor BMET intermittently Monitor I/Os Correct electrolytes  Trial of diuresis   A: GASTROINTESTINAL A: Melena-Resolved Ileus - resolved P: SUP: enteral famotidine Continue tube feeds   A: HEMATOLOGIC Acute drop in Hgb (02/26) P: DVT px: SQ UFH Monitor CBC intermittently Transfuse per usual guidelines   INFECTIOUS A: Influenza A Pneumonitis P: Monitor temp, WBC count Micro and abx as above  A: ENDOCRINE A: Steroid induced hyperglycemia, controlled - no prior dx of DM P: Continue SSI - mod scale Continue Lantus  A: NEUROLOGIC ICU/vent associated discomfort P:  RASS goal: -1, -2 PAD protocol - cont fentanyl gtt Will use propofol and fentanyl as needed  The Patient requires high complexity decision making for assessment and support, frequent evaluation and titration of therapies, application of advanced monitoring technologies and extensive interpretation of multiple databases. Critical Care Time devoted to patient care services described in this note is 40 minutes.   Overall, patient is  critically ill, prognosis is guarded.  Patient will need Trach for survival  Lucie Leather, M.D.  [facility]  Medical Director [facility] Medical Director [facility] Cardio-Pulmonary Department

## 2016-01-14 NOTE — Consult Note (Signed)
After discussion with Sister Sedalia Muta, she has HCPOA papers, She has conveyed to me that patient would NOT want to live on machines. At this time, will place DNR status in chart with accordance with Sister and re-assess clinical status in next 24-48 hrs.

## 2016-01-14 NOTE — Progress Notes (Signed)
Pt temp noted to be 96.0 ax.Charlann Lange hugger applied will cont to monitor.

## 2016-01-14 NOTE — Progress Notes (Signed)
[facility] [place] [facility]   PATIENT NAME: Larry Pacheco    MR#:  [id number]  DATE OF BIRTH:  Feb 06, 1946  SUBJECTIVE:  Unable to obtain-patient on vent No longer febrile CODE BLUE this morning-critical care Bedside, patient had issues with tachycardia, hypotension desaturated despite being on ventilator, loss pulse CPR initiated 1 rounds medication given return of perfusing rhythm   REVIEW OF SYSTEMS:  nable to obtain given patient's mental status medical condition  DRUG ALLERGIES:  No Known Allergies  VITALS:  Blood pressure 138/72, pulse 117, temperature 99.6 F (37.6 C), temperature source Axillary, resp. rate 16, height  (1.676 m), weight 56.6 kg (124 lb 12.5 oz), SpO2 92 %.  PHYSICAL EXAMINATION:   VITAL SIGNS: Filed Vitals:   01/14/16 1200 01/14/16 1230  BP: 131/70 138/72  Pulse: 117 117  Temp:    Resp: 15 16   GENERAL:69 y.o.male critically ill intubated mechanical ventilation HEAD: Normocephalic, atraumatic.  EYES: Pupils equal, round, sluggish reactive to light. Unable to assess extraocular muscles given mental status/medical condition. No scleral icterus.  MOUTH: Moist mucosal membrane. Dentition intact. No abscess noted.  EAR, NOSE, THROAT: Clear without exudates. No external lesions.  NECK: Supple. No thyromegaly. No nodules. No JVD.  PULMONARY: diffuse coarse breath sounds without wheeze rails or rhonci. No use of accessory muscles, mechanical venlation CHEST: Nontender to palpation.  CARDIOVASCULAR: S1 and S2. Tachycardic. No murmurs, rubs, or gallops. No edema. Pedal pulses 2+ bilaterally.  GASTROINTESTINAL: Soft, nontender, nondistended. No masses. Positive bowel sounds. No hepatosplenomegaly.  MUSCULOSKELETAL: No swelling, clubbing, or edema. Passive Range of motion full in all extremities.  NEUROLOGIC: Unable to assess given mental status/medical condition SKIN: No ulceration, lesions, rashes, or cyanosis. Skin warm  and dry. Turgor intact.  PSYCHIATRIC: Unable to assess given mental status/medical condition       LABORATORY PANEL:   CBC  Recent Labs Lab 01/14/16 1000  WBC 23.0*  HGB 7.2*  HCT 22.1*  PLT 295   ------------------------------------------------------------------------------------------------------------------  Chemistries   Recent Labs Lab 01/14/16 1000  NA 138  K 4.4  CL 98*  CO2 30  GLUCOSE 365*  BUN 54*  CREATININE 2.22*  CALCIUM 7.0*  MG 2.8*   ------------------------------------------------------------------------------------------------------------------  Cardiac Enzymes  Recent Labs Lab 01/14/16 0957  TROPONINI 0.25*   ------------------------------------------------------------------------------------------------------------------  RADIOLOGY:  Dg Chest Port 1 View  01/14/2016  CLINICAL DATA:  Acute respiratory failure EXAM: PORTABLE CHEST 1 VIEW COMPARISON:  12/21/2015 FINDINGS: Cardiac shadow is stable. Left-sided PICC line, endotracheal tube and nasogastric catheter are all again seen in satisfactory position. Patchy diffuse predominantly interstitial edema is noted consistent with vascular congestion. No new focal confluent infiltrate is seen. No pneumothorax is noted. IMPRESSION: Persistent changes of bilateral edema. Electronically Signed   By: Alcide Clever M.D.   On: 01/14/2016 10:14   Dg Chest Port 1 View  12/27/2015  CLINICAL DATA:  Hypoxia EXAM: PORTABLE CHEST 1 VIEW COMPARISON:  January 12, 2016 FINDINGS: Endotracheal tube tip is 5.1 cm above the carina. Central catheter tip is in the superior vena cava. Nasogastric tube tip and side port are in the stomach. No pneumothorax. Widespread interstitial and patchy alveolar edema remain throughout the lungs, stable. No new opacity. Heart is upper normal in size with pulmonary vascularity within normal limits. No adenopathy evident. IMPRESSION: Tube and catheter positions as described without  pneumothorax. Widespread interstitial and alveolar opacity remain, likely edema, although superimposed pneumonia cannot be excluded. No change in cardiac silhouette.  No new opacity is seen. Electronically Signed   By: Bretta Bang III M.D.   On: 01/10/2016 07:24   Dg Chest Port 1 View  01/12/2016  CLINICAL DATA:  Patient status post PICC line placement. EXAM: PORTABLE CHEST 1 VIEW COMPARISON:  Chest radiograph earlier same day. FINDINGS: ET tube terminates in the mid trachea. Right IJ central venous catheter tip projects at the superior cavoatrial junction. Left upper extremity PICC line has been retracted with tip projecting at the superior cavoatrial junction. Enteric tube courses inferior to the diaphragm. Stable cardiac and mediastinal contours. Unchanged diffuse bilateral airspace opacities. No pleural effusion or pneumothorax. IMPRESSION: Interval retraction left upper extremity PICC line with tip projecting over the superior cavoatrial junction. Remainder of the support apparatus are stable. Unchanged diffuse bilateral airspace opacities. Electronically Signed   By: Annia Belt M.D.   On: 01/12/2016 16:30   Dg Chest Port 1 View  01/12/2016  CLINICAL DATA:  PICC line placement EXAM: PORTABLE CHEST 1 VIEW COMPARISON:  01/12/2016 FINDINGS: Endotracheal tube and right central line are stable. Interval placement of left PICC line with the tip in the upper right atrium approximately 3 cm below the IVC right atrial junction. Diffuse opacities throughout the lungs, not significantly changed. Heart is normal size. No visible effusions. IMPRESSION: Mental placement of left PICC line with the tip in the upper right atrium approximately 3 cm below the cavoatrial junction. Stable diffuse bilateral airspace disease. Electronically Signed   By: Charlett Nose M.D.   On: 01/12/2016 15:30    EKG:   Orders placed or performed during the hospital encounter of 12/26/2015  . EKG 12-Lead  . EKG 12-Lead  . EKG  12-Lead  . EKG 12-Lead  . ED EKG 12-Lead  . ED EKG 12-Lead  . ED EKG  . ED EKG  . EKG 12-Lead  . EKG 12-Lead  . EKG 12-Lead  . EKG 12-Lead  . EKG 12-Lead  . EKG 12-Lead    ASSESSMENT AND PLAN:   70 year old Caucasian gentleman admitted 01/02/2016  [place]  1. Sepsis: Influenza positive -Tamiflu off antibiotics, , remained afebrile 2. Blood loss anemia: Continue Protonix hemoglobin slowly downward trend has one unit of blood ordered for today  3. Acute respiratory failure with hypoxia full vent support wean as tolerated, wean sedation as tolerated and Seroquel at nighttime to hopefully aid in this process Chest x-ray appears to have increased interstitial infiltrate bilateral, increased vent requirement, question ARDS original plan trach placement will need to have decreased ventilator settings prior to 4. Nutrition - continue tube feeds.  5. Essential hypertension, tachycardia- patient started on metoprolol and Cardizem. 6. Hypokalemia, hypophosphatemia- replaced  Disposition: LTAC , ENT evaluation for tracheostomy  All the records are reviewed and case discussed with Care Management/Social Workerr. Management plans discussed with the patient, family and they are in agreement.  CODE STATUS: full  TOTAL TIME TAKING CARE OF THIS PATIENT: 33 minutes.   POSSIBLE D/C IN 3-4 DAYS, DEPENDING ON CLINICAL CONDITION.   Hower,  Mardi Mainland.D on 01/14/2016 at 2:53 PM  Between 7am to 6pm - Pager - [phone]  After 6pm: House Pager: - [phone]  [facility]  Office  [phone]  CC: Primary care physician; No primary care provider on file.

## 2016-01-14 NOTE — Care Management (Signed)
patient's status has decline and code blue called ths morning for cardaic arrest.  Patient is now a DNR.

## 2016-01-14 NOTE — Progress Notes (Signed)
Spoke with Dr Belia Heman regarding patients labs CBC and Metb- Troponin level. At this point patient has only had 100 ml of urine output. At this time no new orders given except for transfusing 1 unit of blood. Sister is on her way from [place] to visit patient.

## 2016-01-14 NOTE — Progress Notes (Signed)
Nutrition Follow-up    INTERVENTION:   Coordination of Care: TF on hold at present due to change in pt status, will reassess in AM   NUTRITION DIAGNOSIS:   Inadequate oral intake related to acute illness as evidenced by meal completion < 50%.  GOAL:   Patient will meet greater than or equal to 90% of their needs  MONITOR:    (Energy Intake, Anthropometrics, Digestive System)  REASON FOR ASSESSMENT:   Consult Enteral/tube feeding initiation and management  ASSESSMENT:    Pt s/p code blue this AM, no pulse, CPR initiated, pulse regained; pt currently sedated, on levophed  EN: previously tolerating Vital 1.2, on hold post code  Digestive System: abdomen distended, BS hypoactive, diarrhea, via flexiseal  Skin:  Reviewed, no issues   Recent Labs Lab 01/09/16 0517  01/12/16 0533 01/11/2016 0446 01/14/16 0453 01/14/16 1000  NA 144  < > 142 143 142 138  K 4.0  < > 3.6 4.2 5.0 4.4  CL 104  < > 105 104 103 98*  CO2 34*  < > 30 33* 31 30  BUN 30*  < > 30* 30* 49* 54*  CREATININE 0.89  < > 0.70 1.08 1.86* 2.22*  CALCIUM 7.7*  < > 7.5* 7.3* 7.2* 7.0*  MG 2.0  --  1.9  --   --  2.8*  PHOS 2.3*  --  2.3*  --   --  7.7*  GLUCOSE 163*  < > 100* 116* 104* 365*  < > = values in this interval not displayed.  Glucose Profile:  Recent Labs  01/14/16 0750 01/14/16 0958 01/14/16 1204  GLUCAP 128* 134* 176*   Meds: ss novolog, lantus, levophed, diprivan  Height:   Ht Readings from Last 1 Encounters:  01/03/2016  (1.676 m)    Weight:   Wt Readings from Last 1 Encounters:  01/14/16 124 lb 12.5 oz (56.6 kg)   BMI:  Body mass index is 20.15 kg/(m^2).  Estimated Nutritional Needs:   Kcal:  1468 kcals (BEE 1243, Ve: 5.1, Tmax: 37.9) using wt of 55 kg  Protein:  83-110 g (1.5-2.0 g/kg)   Fluid:  1375-1650 mL (25-30 ml/kg)   HIGH Care Level  Romelle Starcher MS, RD, LDN [phone] Pager  [phone] Weekend/On-Call Pager

## 2016-01-14 NOTE — Progress Notes (Signed)
Pastoral presence during code. Silent prayer offered for patient and staff.

## 2016-01-14 NOTE — Progress Notes (Signed)
Change to previous note. ETT was advanced to 26 at the lip.

## 2016-01-14 NOTE — Progress Notes (Signed)
Pt was placed on PRVC at 0945, unsure as to why. Pt has been asynchronous with the ventilator since the beginning of shift. RN and myself made several attempts to comfort pt. Without success. Placed pt in PCV ventilation, which is the last written order for vent settings. Pt seems much more comfortable. HR 99, SPO2 98%, RR 14. Will cont. To monitor pt and vent throughout the night.

## 2016-01-15 ENCOUNTER — Inpatient Hospital Stay (HOSPITAL_COMMUNITY)
Admit: 2016-01-15 | Discharge: 2016-01-15 | Disposition: A | Discharge status: Home / Self Care | Payer: Medicare Other | Attending: Adult Health | Admitting: Adult Health

## 2016-01-15 DIAGNOSIS — R69 Illness, unspecified: Secondary | ICD-10-CM

## 2016-01-15 DIAGNOSIS — I469 Cardiac arrest, cause unspecified: Secondary | ICD-10-CM

## 2016-01-15 LAB — TYPE AND SCREEN
ABO/RH(D): A POS
Antibody Screen: NEGATIVE
UNIT DIVISION: 0
UNIT DIVISION: 0

## 2016-01-15 LAB — BLOOD GAS, ARTERIAL
ACID-BASE EXCESS: 0.8 mmol/L (ref 0.0–3.0)
ACID-BASE EXCESS: 10.4 mmol/L — AB (ref 0.0–3.0)
ACID-BASE EXCESS: 4.7 mmol/L — AB (ref 0.0–3.0)
ALLENS TEST (PASS/FAIL): POSITIVE — AB
Allens test (pass/fail): POSITIVE — AB
Allens test (pass/fail): POSITIVE — AB
BICARBONATE: 34.8 meq/L — AB (ref 21.0–28.0)
BICARBONATE: 42.8 meq/L — AB (ref 21.0–28.0)
Bicarbonate: 35.2 mEq/L — ABNORMAL HIGH (ref 21.0–28.0)
FIO2: 1
FIO2: 1
FIO2: 1
LHR: 14 {breaths}/min
Mechanical Rate: 14
Mechanical Rate: 20
Mechanical Rate: 24
O2 SAT: 94.3 %
O2 SAT: 85.3 %
O2 Saturation: 86.5 %
PATIENT TEMPERATURE: 37
PEEP/CPAP: 10 cmH2O
PEEP: 10 cmH2O
PEEP: 10 cmH2O
PH ART: 7.21 — AB (ref 7.350–7.450)
PH ART: 7.23 — AB (ref 7.350–7.450)
PO2 ART: 61 mmHg — AB (ref 83.0–108.0)
PO2 ART: 62 mmHg — AB (ref 83.0–108.0)
PRESSURE CONTROL: 28 cmH2O
PRESSURE CONTROL: 28 cmH2O
Patient temperature: 37
Patient temperature: 37
Pressure control: 28 cmH2O
RATE: 24 resp/min
pCO2 arterial: 120 mmHg (ref 32.0–48.0)
pCO2 arterial: 107 mmHg (ref 32.0–48.0)
pCO2 arterial: 84 mmHg (ref 32.0–48.0)
pH, Arterial: 7.07 — CL (ref 7.350–7.450)
pO2, Arterial: 98 mmHg (ref 83.0–108.0)

## 2016-01-15 LAB — BASIC METABOLIC PANEL
ANION GAP: 7 (ref 5–15)
BUN: 69 mg/dL — ABNORMAL HIGH (ref 6–20)
CHLORIDE: 102 mmol/L (ref 101–111)
CO2: 32 mmol/L (ref 22–32)
Calcium: 7.2 mg/dL — ABNORMAL LOW (ref 8.9–10.3)
Creatinine, Ser: 2.86 mg/dL — ABNORMAL HIGH (ref 0.61–1.24)
GFR calc Af Amer: 24 mL/min — ABNORMAL LOW (ref >60)
GFR, EST NON AFRICAN AMERICAN: 21 mL/min — AB (ref >60)
Glucose, Bld: 144 mg/dL — ABNORMAL HIGH (ref 65–99)
POTASSIUM: 5.5 mmol/L — AB (ref 3.5–5.1)
SODIUM: 141 mmol/L (ref 135–145)

## 2016-01-15 LAB — GLUCOSE, CAPILLARY
GLUCOSE-CAPILLARY: 118 mg/dL — AB (ref 65–99)
GLUCOSE-CAPILLARY: 137 mg/dL — AB (ref 65–99)
GLUCOSE-CAPILLARY: 138 mg/dL — AB (ref 65–99)
GLUCOSE-CAPILLARY: 80 mg/dL (ref 65–99)
Glucose-Capillary: 92 mg/dL (ref 65–99)

## 2016-01-15 LAB — MAGNESIUM: MAGNESIUM: 2.6 mg/dL — AB (ref 1.7–2.4)

## 2016-01-15 LAB — TROPONIN I: TROPONIN I: 0.32 ng/mL — AB (ref <0.031)

## 2016-01-15 MED ORDER — MORPHINE SULFATE (PF) 2 MG/ML IV SOLN
2.0000 mg | INTRAVENOUS | Status: DC | PRN
Start: 2016-01-15 — End: 2016-01-15
  Administered 2016-01-15: 2 mg via INTRAVENOUS
  Filled 2016-01-15: qty 1

## 2016-01-15 MED ORDER — VASOPRESSIN 20 UNIT/ML IV SOLN
0.0300 [IU]/min | INTRAVENOUS | Status: DC
  Administered 2016-01-15: 0.03 [IU]/min via INTRAVENOUS
  Filled 2016-01-15: qty 2

## 2016-01-15 MED ORDER — AMIODARONE LOAD VIA INFUSION
150.0000 mg | Freq: Once | INTRAVENOUS | Status: AC
  Administered 2016-01-15: 150 mg via INTRAVENOUS
  Filled 2016-01-15: qty 83.34

## 2016-01-15 MED ORDER — SODIUM CHLORIDE 0.9 % IV SOLN
6.0000 mg/kg | Freq: Two times a day (BID) | INTRAVENOUS | Status: DC
  Filled 2016-01-15 (×2): qty 350

## 2016-01-15 MED ORDER — LORAZEPAM 2 MG/ML IJ SOLN: 2.0000 mg | INTRAMUSCULAR | Status: DC | PRN

## 2016-01-15 MED ORDER — AMIODARONE HCL IN DEXTROSE 360-4.14 MG/200ML-% IV SOLN
60.0000 mg/h | INTRAVENOUS | Status: DC
  Administered 2016-01-15 (×2): 60 mg/h via INTRAVENOUS
  Filled 2016-01-15: qty 200

## 2016-01-15 MED ORDER — VORICONAZOLE 200 MG IV SOLR: 4.0000 mg/kg | Freq: Two times a day (BID) | INTRAVENOUS | Status: DC

## 2016-01-15 MED ORDER — AMIODARONE HCL IN DEXTROSE 360-4.14 MG/200ML-% IV SOLN
30.0000 mg/h | INTRAVENOUS | Status: DC
  Administered 2016-01-15: 30 mg/h via INTRAVENOUS
  Filled 2016-01-15 (×4): qty 200

## 2016-01-15 MED ORDER — DEXTROSE 5 % IV SOLN
INTRAVENOUS | Status: DC
  Administered 2016-01-15: 50 mL via INTRAVENOUS

## 2016-01-15 MED ORDER — DEXTROSE 50 % IV SOLN
INTRAVENOUS | Status: AC
Start: 2016-01-15 — End: 2016-01-15
  Administered 2016-01-15: 25 mL via INTRAVENOUS
  Filled 2016-01-15: qty 50

## 2016-01-15 MED ORDER — SODIUM BICARBONATE 8.4 % IV SOLN
100.0000 meq | Freq: Once | INTRAVENOUS | Status: AC
  Administered 2016-01-15: 100 meq via INTRAVENOUS
  Filled 2016-01-15: qty 100

## 2016-01-15 MED ORDER — DEXTROSE 50 % IV SOLN
25.0000 mL | Freq: Once | INTRAVENOUS | Status: AC
  Administered 2016-01-15: 25 mL via INTRAVENOUS

## 2016-01-15 MED ORDER — SODIUM CHLORIDE 0.9 % IV BOLUS (SEPSIS)
1000.0000 mL | Freq: Once | INTRAVENOUS | Status: AC
  Administered 2016-01-15: 1000 mL via INTRAVENOUS

## 2016-01-15 MED FILL — Medication: Qty: 1 | Status: AC

## 2016-01-15 DEATH — deceased

## 2016-01-16 ENCOUNTER — Ambulatory Visit: Payer: Self-pay | Admitting: Family Medicine

## 2016-01-16 SURGERY — CREATION, TRACHEOSTOMY
Anesthesia: Choice

## 2016-01-24 LAB — FUNGUS CULTURE W SMEAR: Special Requests: NORMAL

## 2016-02-06 LAB — CULTURE, RESPIRATORY

## 2016-02-06 LAB — CULTURE, RESPIRATORY W GRAM STAIN: Special Requests: NORMAL

## 2016-02-15 NOTE — Progress Notes (Signed)
PULMONARY / CRITICAL CARE MEDICINE   Name: Larry Pacheco MRN: [id number] DOB: 09/06/1946    ADMISSION DATE:  12/21/2015  BRIEF HISTORY: 69 year old male  admitted on 12/30/2011 for shortness of breath, and intermittent hemoptysis, started experiencing shortness of hemoptysis along with cough about one week ago after going out to the [place] to bird watch.  CTA chest negative for pulmonary embolus. Acute decline in respiratory status from nasal cannula to high flow oxygen to requiring intubation.   SUBJECTIVE:  Agitated on WUA. RASS -3, not F/C. Tachypnea. Tachycardia, given VEC for increased WOB Patient remains on high fio2 increased to 100% yesterday PEEP at 10, remains intubated,sedated VENT DAY 13 I met with sister yesterday, we had discussion of very poor prognosis with multiorgan failure -will plan for comfort care measures when family is ready  STUDIES:  02/12 CT chest: no PE, multilobar basilar pneumonia, mildly tight findings, pneumonitis 02/16 TTE: LVEF 55-60% 02/22 CT chest: Diffuse pulmonary airspace disease and small bilateral effusions and mild mediastinal lymphadenopathy   Micro: MRSA PCR 02/15 >>NEG Rapid flu 02/13 >> NEG Blood 2/13 >> 1/2 staph saccharolyticus (likely contaminant) Urine 02/13 >> NEG C diff 02/15 >> NEG Resp 02/15 >> light growth candida BAL 2/16 >> heavy growth yeast (candida albicans) and influenza A HIV 02/20 >> NEG Resp 02/20 >> Moderate yeast Pneumocystis DFA 02/20 >> NEG Legionella DFA 02/20 >> NEG   Antibiotics: Azithro 02/13 >> 02/19 Ceftriaxone 02/13 >> 02/15 Vanc 2/15 >> 02/18 Zosyn 2/15 >>02/22  Voriconazole 2/22 >> 02/25 Doxycycline 02/23 >> 02/25 Tamiflu 02/25 >>   Lines: ETT 02/15 >>  R IJ CVL 02/16 >> 02/26 (removal ordered) PICC (ordered) 02/26 >>      VITAL SIGNS: Temp:  [95.4 F (35.2 C)-99.1 F (37.3 C)] 99 F (37.2 C) (03/01 1000) Pulse Rate:  [88-149] 102 (03/01 1000) Resp:  [14-33] 26 (03/01 0900) BP:  (59-148)/(48-90) 129/80 mmHg (03/01 1000) SpO2:  [81 %-100 %] 92 % (03/01 1000) FiO2 (%):  [100 %] 100 % (03/01 0844) Weight:  [128 lb 4.9 oz (58.2 kg)] 128 lb 4.9 oz (58.2 kg) (03/01 0523) HEMODYNAMICS:   VENTILATOR SETTINGS: Vent Mode:  [-] PCV FiO2 (%):  [100 %] 100 % Set Rate:  [14 bmp-24 bmp] 24 bmp Vt Set:  [430 mL] 430 mL PEEP:  [10 cmH20-15 cmH20] 10 cmH20 Plateau Pressure:  [28 cmH20] 28 cmH20 INTAKE / OUTPUT:  Intake/Output Summary (Last 24 hours) at 01-27-16 1051 Last data filed at January 27, 2016 0645  Gross per 24 hour  Intake 1925.06 ml  Output    630 ml  Net 1295.06 ml    Review of Systems  Unable to perform ROS: intubated    Physical Exam  HENT:  Head: Normocephalic.  Eyes: Pupils are equal, round, and reactive to light.  Neck: No JVD present.  Cardiovascular: Regular rhythm, normal heart sounds and intact distal pulses.   No murmur heard. Pulmonary/Chest: He has no wheezes. He has no rales.  Abdominal: Soft. Bowel sounds are normal.  Musculoskeletal: He exhibits no edema.  Neurological: He displays normal reflexes. No cranial nerve deficit.  Skin: Skin is warm.  Nursing note and vitals reviewed. GCS<8T  LABS:  CBC  Recent Labs Lab 01/12/16 0533 12/18/2015 0446 01/14/16 1000  WBC 23.6* 20.2* 23.0*  HGB 6.6* 8.5* 7.2*  HCT 20.1* 25.2* 22.1*  PLT 497* 428 295   Coag's No results for input(s): APTT, INR in the last 168 hours. BMET  Recent Labs Lab 01/14/16 [phone]  01/14/16 1000 2016/01/20 0051  NA 142 138 141  K 5.0 4.4 5.5*  CL 103 98* 102  CO2 31 30 32  BUN 49* 54* 69*  CREATININE 1.86* 2.22* 2.86*  GLUCOSE 104* 365* 144*   Electrolytes  Recent Labs Lab 01/09/16 0517  01/12/16 0533  01/14/16 0453 01/14/16 1000 20-Jan-2016 0051  CALCIUM 7.7*  < > 7.5*  < > 7.2* 7.0* 7.2*  MG 2.0  --  1.9  --   --  2.8* 2.6*  PHOS 2.3*  --  2.3*  --   --  7.7*  --   < > = values in this interval not displayed. Sepsis Markers  Recent Labs Lab  01/09/16 0517  PROCALCITON 0.57   ABG  Recent Labs Lab 01-20-16 0100 20-Jan-2016 0230 01/20/2016 0503  PHART 7.07* 7.21* 7.23*  PCO2ART 120* 107* 84*  PO2ART 98 61* 62*   Liver Enzymes No results for input(s): AST, ALT, ALKPHOS, BILITOT, ALBUMIN in the last 168 hours. Cardiac Enzymes  Recent Labs Lab 01/14/16 0957 01/14/16 1640 01/14/16 2200  TROPONINI 0.25* 0.70* 0.32*   Glucose  Recent Labs Lab 01/14/16 1627 01/14/16 1946 01/14/16 2348 01/20/2016 0041 01-20-2016 0542 2016-01-20 0740  GLUCAP 151* 101* 60* 138* 137* 118*    CXR: NSC bilateral alveolar infiltrates  ASSESSMENT / PLAN: 70 yo white male with acute and severe  Hypoxic resp failure from atypical viral pneumonia with ARDS  A: PULMONARY ARDS Influenza A pneumonitis Prolonged VDRF P:  Cont vent support - settings reviewed and/or adjusted PCV Use ARDS PEEP/FiO2 algorithm Will cancel Trach-plan for comfort care measures  A: CARDIOVASCULAR Minimally elevated troponin - likely demand ischemia Hypertension Sinus tachycardia P:  -vasopressors as needed  A: RENAL Positive fluid balance P: Monitor BMET intermittently Monitor I/Os Correct electrolytes   A: GASTROINTESTINAL A: Melena-Resolved Ileus - resolved P: SUP: enteral famotidine Hold TF for distended abdomen  A: HEMATOLOGIC Acute drop in Hgb (02/26) P: DVT px: SQ UFH Monitor CBC intermittently Transfuse per usual guidelines   INFECTIOUS A: Influenza A Pneumonitis P: Monitor temp, WBC count Micro and abx as above  A: ENDOCRINE A: Steroid induced hyperglycemia, controlled - no prior dx of DM P: Hold lantis for now  A: NEUROLOGIC ICU/vent associated discomfort P:  RASS goal: -1, -2 PAD protocol - cont fentanyl gtt Will use propofol and fentanyl as needed  The Patient requires high complexity decision making for assessment and support, frequent evaluation and titration of therapies, application of advanced monitoring  technologies and extensive interpretation of multiple databases. Critical Care Time devoted to patient care services described in this note is 40 minutes.   Overall, patient is critically ill, prognosis is guarded. AFter further assessment, patient with severe multiorgan failure, I have updated Sister Diane and we had discussion of comfort care measures as the patient would NOT want to live like this. Will plan for comfort care measures when family decides  Corrin Parker, M.D.  [facility]  Medical Director [place] Director [facility] Cardio-Pulmonary Department

## 2016-02-15 NOTE — Progress Notes (Signed)
Nutrition Brief Note  Chart reviewed. Per MD note, plan to proceed with comfort care measures later today.  No further nutrition interventions warranted at this time due to poc. Will sign off Please re-consult as needed.   Romelle Starcher MS, RD, LDN [phone] Pager  [phone] Weekend/On-Call Pager

## 2016-02-15 NOTE — Progress Notes (Signed)
[facility] CLINIC INFECTIOUS DISEASE PROGRESS NOTE Date of Admission:  January 23, 2016     ID: Larry Pacheco is a 70 y.o. male with severe PNA and ARDS Active Problems:   Pneumonia   Community acquired pneumonia   Hemoptysis   Sepsis (HCC)   ARDS (adult respiratory distress syndrome) (HCC)   Acute respiratory failure (HCC)   Pressure ulcer  Subjective: Remains intubated, sputum cx + Mold - on levophed  ROS  Intubated  Medications:  Antibiotics Given (last 72 hours)    Date/Time Action Medication Dose   01/12/16 2140 Given   oseltamivir (TAMIFLU) capsule 75 mg 75 mg   12/29/2015 0945 Given   oseltamivir (TAMIFLU) capsule 75 mg 75 mg   12/26/2015 2219 Given   oseltamivir (TAMIFLU) capsule 75 mg 75 mg   01/14/16 0915 Given   oseltamivir (TAMIFLU) capsule 75 mg 75 mg   02/02/2016 0901 Given   oseltamivir (TAMIFLU) capsule 30 mg 30 mg     . antiseptic oral rinse  7 mL Mouth Rinse QID  . chlorhexidine gluconate  15 mL Mouth Rinse BID  . feeding supplement (PRO-STAT SUGAR FREE 64)  30 mL Per Tube Daily  . free water  200 mL Per Tube 3 times per day  . heparin subcutaneous  5,000 Units Subcutaneous Q12H  . insulin aspart  0-15 Units Subcutaneous 6 times per day  . insulin glargine  10 Units Subcutaneous Daily  . metoprolol  5 mg Intravenous Once  . metoprolol tartrate  50 mg Per Tube Q8H  . oseltamivir  30 mg Per Tube Daily  . pantoprazole (PROTONIX) IV  40 mg Intravenous Q12H  . QUEtiapine  100 mg Oral QHS  . sodium chloride flush  10-40 mL Intracatheter Q12H    Objective: Vital signs in last 24 hours: Temp:  [95.4 F (35.2 C)-99.1 F (37.3 C)] 99 F (37.2 C) (03/01 1100) Pulse Rate:  [88-149] 98 (03/01 1100) Resp:  [14-29] 26 (03/01 0900) BP: (59-148)/(49-90) 94/63 mmHg (03/01 1100) SpO2:  [83 %-100 %] 93 % (03/01 1100) FiO2 (%):  [100 %] 100 % (03/01 1509) Weight:  [58.2 kg (128 lb 4.9 oz)] 58.2 kg (128 lb 4.9 oz) (03/01 0523)  Constitutional: thin, intubated,  HENT:  Perrla, no icterus Mouth/Throat: ETT in place Cardiovascular: Tachy, reg Pulmonary/Chest: Mech BS Abdominal: Soft. Bowel sounds are normal. He exhibits no distension. There is no tenderness.  Lymphadenopathy: He has no cervical adenopathy.  Neurological: opens eyes.  Skin: Skin is warm and dry. No rash noted. No erythema.  Psychiatric: intubated Lab Results  Recent Labs  01/11/2016 0446  01/14/16 1000 02/06/2016 0051  WBC 20.2*  --  23.0*  --   HGB 8.5*  --  7.2*  --   HCT 25.2*  --  22.1*  --   NA 143  < > 138 141  K 4.2  < > 4.4 5.5*  CL 104  < > 98* 102  CO2 33*  < > 30 32  BUN 30*  < > 54* 69*  CREATININE 1.08  < > 2.22* 2.86*  < > = values in this interval not displayed.  Microbiology: Results for orders placed or performed during the hospital encounter of 01/23/2016  Rapid Influenza A&B Antigens [facility] only)     Status: None   Collection Time: 2016/01/23  3:01 PM  Result Value Ref Range Status   Influenza A ([facility]) NEGATIVE  Final   Influenza B ([facility]) NEGATIVE  Final  Blood Culture (routine x  2)     Status: None   Collection Time: 2016/01/03  3:55 PM  Result Value Ref Range Status   Specimen Description BLOOD RIGHT ASSIST CONTROL  Final   Special Requests   Final    BOTTLES DRAWN AEROBIC AND ANAEROBIC  6CC AERO, 5CC ANA   Culture  Setup Time   Final    GRAM POSITIVE COCCI ANAEROBIC BOTTLE ONLY CRITICAL RESULT CALLED TO, READ BACK BY AND VERIFIED WITH: Ihor Austin 01/02/16 1250PM MLM Organism ID to follow    Culture   Final    STAPHYLOCOCCUS SACCHAROLYTICUS ANAEROBIC BOTTLE ONLY Results consistent with contamination.    Report Status 01/05/2016 FINAL  Final  Blood Culture ID Panel (Reflexed)     Status: None   Collection Time: 01/03/16  3:55 PM  Result Value Ref Range Status   Enterococcus species NOT DETECTED NOT DETECTED Final   Listeria monocytogenes NOT DETECTED NOT DETECTED Final   Staphylococcus species NOT DETECTED NOT DETECTED Final   Staphylococcus aureus  NOT DETECTED NOT DETECTED Final   Streptococcus species NOT DETECTED NOT DETECTED Final   Streptococcus agalactiae NOT DETECTED NOT DETECTED Final   Streptococcus pneumoniae NOT DETECTED NOT DETECTED Final   Streptococcus pyogenes NOT DETECTED NOT DETECTED Final   Acinetobacter baumannii NOT DETECTED NOT DETECTED Final   Enterobacteriaceae species NOT DETECTED NOT DETECTED Final   Enterobacter cloacae complex NOT DETECTED NOT DETECTED Final   Escherichia coli NOT DETECTED NOT DETECTED Final   Klebsiella oxytoca NOT DETECTED NOT DETECTED Final   Klebsiella pneumoniae NOT DETECTED NOT DETECTED Final   Proteus species NOT DETECTED NOT DETECTED Final   Serratia marcescens NOT DETECTED NOT DETECTED Final   Haemophilus influenzae NOT DETECTED NOT DETECTED Final   Neisseria meningitidis NOT DETECTED NOT DETECTED Final   Pseudomonas aeruginosa NOT DETECTED NOT DETECTED Final   Candida albicans NOT DETECTED NOT DETECTED Final   Candida glabrata NOT DETECTED NOT DETECTED Final   Candida krusei NOT DETECTED NOT DETECTED Final   Candida parapsilosis NOT DETECTED NOT DETECTED Final   Candida tropicalis NOT DETECTED NOT DETECTED Final   Carbapenem resistance NOT DETECTED NOT DETECTED Final   Methicillin resistance NOT DETECTED NOT DETECTED Final   Vancomycin resistance NOT DETECTED NOT DETECTED Final  Blood Culture (routine x 2)     Status: None   Collection Time: 01-03-16  4:01 PM  Result Value Ref Range Status   Specimen Description BLOOD LEFT ASSIST CONTROL  Final   Special Requests   Final    BOTTLES DRAWN AEROBIC AND ANAEROBIC  3CC AERO, 2CC ANA   Culture NO GROWTH 5 DAYS  Final   Report Status 01/04/2016 FINAL  Final  Urine culture     Status: None   Collection Time: January 03, 2016  6:36 PM  Result Value Ref Range Status   Specimen Description URINE, RANDOM  Final   Special Requests NONE  Final   Culture NO GROWTH 1 DAY  Final   Report Status 01/01/2016 FINAL  Final  MRSA PCR Screening      Status: None   Collection Time: 01/01/16  2:08 AM  Result Value Ref Range Status   MRSA by PCR NEGATIVE NEGATIVE Final    Comment:        The GeneXpert MRSA Assay (FDA approved for NASAL specimens only), is one component of a comprehensive MRSA colonization surveillance program. It is not intended to diagnose MRSA infection nor to guide or monitor treatment for MRSA infections.   Gastrointestinal Panel  by PCR , Stool     Status: None   Collection Time: 01/01/16  6:59 AM  Result Value Ref Range Status   Campylobacter species NOT DETECTED NOT DETECTED Final   Plesimonas shigelloides NOT DETECTED NOT DETECTED Final   Salmonella species NOT DETECTED NOT DETECTED Final   Yersinia enterocolitica NOT DETECTED NOT DETECTED Final   Vibrio species NOT DETECTED NOT DETECTED Final   Vibrio cholerae NOT DETECTED NOT DETECTED Final   Enteroaggregative E coli (EAEC) NOT DETECTED NOT DETECTED Final   Enteropathogenic E coli (EPEC) NOT DETECTED NOT DETECTED Final   Enterotoxigenic E coli (ETEC) NOT DETECTED NOT DETECTED Final   Shiga like toxin producing E coli (STEC) NOT DETECTED NOT DETECTED Final   E. coli O157 NOT DETECTED NOT DETECTED Final   Shigella/Enteroinvasive E coli (EIEC) NOT DETECTED NOT DETECTED Final   Cryptosporidium NOT DETECTED NOT DETECTED Final   Cyclospora cayetanensis NOT DETECTED NOT DETECTED Final   Entamoeba histolytica NOT DETECTED NOT DETECTED Final   Giardia lamblia NOT DETECTED NOT DETECTED Final   Adenovirus F40/41 NOT DETECTED NOT DETECTED Final   Astrovirus NOT DETECTED NOT DETECTED Final   Norovirus GI/GII NOT DETECTED NOT DETECTED Final   Rotavirus A NOT DETECTED NOT DETECTED Final   Sapovirus (I, II, IV, and V) NOT DETECTED NOT DETECTED Final  C difficile quick scan w PCR reflex     Status: None   Collection Time: 01/01/16  6:59 AM  Result Value Ref Range Status   C Diff antigen NEGATIVE NEGATIVE Final   C Diff toxin NEGATIVE NEGATIVE Final   C Diff  interpretation Negative for C. difficile  Final  Culture, expectorated sputum-assessment     Status: None   Collection Time: 01/01/16 10:46 AM  Result Value Ref Range Status   Specimen Description SPUTUM  Final   Special Requests Normal  Final   Sputum evaluation THIS SPECIMEN IS ACCEPTABLE FOR SPUTUM CULTURE  Final   Report Status 01/01/2016 FINAL  Final  Culture, respiratory (NON-Expectorated)     Status: None   Collection Time: 01/01/16 10:46 AM  Result Value Ref Range Status   Specimen Description SPUTUM  Final   Special Requests Normal Reflexed from [id number]  Final   Gram Stain   Final    FAIR SPECIMEN - 70-80% WBCS FEW SQUAMOUS EPITHELIAL CELLS PRESENT MODERATE WBC SEEN MODERATE YEAST FEW GRAM NEGATIVE RODS RARE GRAM POSITIVE COCCI IN PAIRS    Culture LIGHT GROWTH CANDIDA ALBICANS  Final   Report Status 01/04/2016 FINAL  Final  Fungus Culture with Smear     Status: None (Preliminary result)   Collection Time: 01/02/16  2:52 PM  Result Value Ref Range Status   Specimen Description BRONCHIAL WASHINGS  Final   Special Requests Normal  Final   Culture CANDIDA ALBICANS  Final   Report Status PENDING  Incomplete  Culture, bal-quantitative     Status: None   Collection Time: 01/02/16  2:52 PM  Result Value Ref Range Status   Specimen Description BRONCHIAL WASHINGS  Final   Special Requests Normal  Final   Gram Stain   Final    FAIR SPECIMEN - 70-80% WBCS FEW SQUAMOUS EPITHELIAL CELLS PRESENT MODERATE WBC SEEN MANY YEAST    Culture HEAVY GROWTH CANDIDA ALBICANS  Final   Report Status 01/06/2016 FINAL  Final  Virus culture     Status: Abnormal   Collection Time: 01/02/16  2:52 PM  Result Value Ref Range Status   Viral Culture  Comment (A)  Final    Comment: (NOTE) Positive Influenza A detected. Performed At: [facility] [address] [place], [place] [id number] Mila Homer MD WU:[id number]    Source of Sample BRONCHIAL WASHINGS  Final  Culture, blood  (Routine X 2) w Reflex to ID Panel     Status: None   Collection Time: 01/06/16 10:42 AM  Result Value Ref Range Status   Specimen Description BLOOD RIGHT ASSIST CONTROL  Final   Special Requests BOTTLES DRAWN AEROBIC AND ANAEROBIC 7CCAERO,7CCANA  Final   Culture NO GROWTH 5 DAYS  Final   Report Status 01/11/2016 FINAL  Final  Culture, blood (Routine X 2) w Reflex to ID Panel     Status: None   Collection Time: 01/06/16 10:51 AM  Result Value Ref Range Status   Specimen Description BLOOD LEFT ASSIST CONTROL  Final   Special Requests BOTTLES DRAWN AEROBIC AND ANAEROBIC 5CCAERO,5CCANA  Final   Culture NO GROWTH 5 DAYS  Final   Report Status 01/11/2016 FINAL  Final  Culture, respiratory (NON-Expectorated)     Status: None (Preliminary result)   Collection Time: 01/06/16 12:08 PM  Result Value Ref Range Status   Specimen Description TRACHEAL ASPIRATE  Final   Special Requests NONE  Final   Gram Stain   Final    FEW WBC SEEN MANY YEAST GOOD SPECIMEN - 80-90% WBCS    Culture   Final    HEAVY GROWTH CANDIDA ALBICANS MOLD SENT TO STATE FOR IDENTIFICATION    Report Status PENDING  Incomplete  Culture, expectorated sputum-assessment     Status: None   Collection Time: 01/09/16  3:09 PM  Result Value Ref Range Status   Specimen Description TRACHEAL ASPIRATE  Final   Special Requests Normal  Final   Sputum evaluation THIS SPECIMEN IS ACCEPTABLE FOR SPUTUM CULTURE  Final   Report Status 01/09/2016 FINAL  Final  Culture, respiratory (NON-Expectorated)     Status: None (Preliminary result)   Collection Time: 01/09/16  3:09 PM  Result Value Ref Range Status   Specimen Description TRACHEAL ASPIRATE  Final   Special Requests Normal Reflexed from [id number]  Final   Gram Stain FEW WBC SEEN MODERATE YEAST   Final   Culture MOLD SENT TO STATE FOR IDENTIFICATION   Final   Report Status PENDING  Incomplete    Studies/Results: Dg Chest Port 1 View  01/14/2016  CLINICAL DATA:  Acute  respiratory failure EXAM: PORTABLE CHEST 1 VIEW COMPARISON:  Feb 11, 2016 FINDINGS: Cardiac shadow is stable. Left-sided PICC line, endotracheal tube and nasogastric catheter are all again seen in satisfactory position. Patchy diffuse predominantly interstitial edema is noted consistent with vascular congestion. No new focal confluent infiltrate is seen. No pneumothorax is noted. IMPRESSION: Persistent changes of bilateral edema. Electronically Signed   By: Alcide Clever M.D.   On: 01/14/2016 10:14    Assessment/Plan: Larry Pacheco is a 70 y.o. male with a progressive severe and (by history) acute pulmonary process with no evidence of immunocompromise and cultures negative except for candida albicans and follow up culture growing light growth of a mold.  He per history had been on a trip to watch birds at [employer], has a pet turtle and has 2 cats which has been ill and have since died. BAL culture now growing influenza A Recommendations Finish tamiflu course Given the mold on trach I would restart voriconazole for now  Thank you very much for the consult.  Marland KitchenFITZGERALD, Owynn Mosqueda   01/21/2016, 3:43  PM

## 2016-02-15 NOTE — Progress Notes (Signed)
eLink Physician-Brief Progress Note Patient Name: Larry Pacheco DOB: [birth year] MRN: [id number]   Date of Service  01/16/2016  HPI/Events of Note  ABG on VC rate of 20 = 7.21/107/61/42.  eICU Interventions  Will increase rate to 24 and repeat ABG at 5 AM.      Intervention Category Major Interventions: Respiratory failure - evaluation and management  Renel Ende Eugene 01/18/2016, 2:49 AM

## 2016-02-15 NOTE — Progress Notes (Signed)
Spoke with Dr. Laural Benes pt remains hypotensive with systolic bp 50's; md placed additional orders to manage hypotension

## 2016-02-15 NOTE — Progress Notes (Signed)
Pharmacy Antibiotic Note  Larry Pacheco is a 70 y.o. male admitted on 22-Jan-2016 with pneumonia and sepsis.  Pharmacy consulted to resume voriconazole dosing.   Plan: Will resume voriconazole at 6 mg/kg q 12 hours x 2 doses and continue with 4 mg/kg q 12 hours.    Height:  (167.6 cm) Weight: 128 lb 4.9 oz (58.2 kg) IBW/kg (Calculated) : 63.8  Temp (24hrs), Avg:97.7 F (36.5 C), Min:95.4 F (35.2 C), Max:99.1 F (37.3 C)   Recent Labs Lab 01/10/16 0454 01/11/16 0423 01/12/16 0533 01/07/2016 0446 01/14/16 0453 01/14/16 1000 02/06/2016 0051  WBC 33.7* 30.7* 23.6* 20.2*  --  23.0*  --   CREATININE 0.90 0.87 0.70 1.08 1.86* 2.22* 2.86*    Estimated Creatinine Clearance: 19.5 mL/min (by C-G formula based on Cr of 2.86).    No Known Allergies   Antibiotics:  Ceftriaxone 2/13 >>2/14 Azithromycin 2/13 >>2/14, 2/16>>2/18  Vanc 2/15>>2/15 Zosyn 2/15>>2/22 Voriconazole 2/22 >> 02/25 Doxycycline 2/23 >> 02/25 Voriconazole 03/02 >>  Microbiology:  02/13 Flu: negative 02/15 MRSA PCR: negative 02/13 Urine cx: negative 02/15 GI panel: negative 02/15 C diff: negative 02/15 Sputum cx: pending 02/15 GPC in 1/4 bottles 02/16 Bronch Wash: Heavy Growth Candida Albicans  02/20 Respiratory Culture: Heavy Growth Yeast/Mold sent, Candida albicans 02/20 Blood cx: NG x 2  Pharmacy will continue to monitor and adjust per consult.  Luisa Hart D 02/03/2016 4:04 PM

## 2016-02-15 NOTE — Consult Note (Signed)
After discussion with Diane the Sister and [facility],  we will wait for the Renato Gails to arrive today this afternoon and will proceed with comfort care measures. Diane is satisfied with this plan of action.

## 2016-02-15 NOTE — Progress Notes (Signed)
eLink Physician-Brief Progress Note Patient Name: Larry Pacheco DOB: 1946/02/28 MRN: [id number]   Date of Service  Feb 07, 2016  HPI/Events of Note  Hypercarbic Respiratory Failure - ABG = 7.18/120/98 on PC rate = 14.  eICU Interventions  Will order: 1. Increase rate to 20. 2. ABG at 2:30 AM.      Intervention Category Major Interventions: Acid-Base disturbance - evaluation and management;Respiratory failure - evaluation and management  Jenafer Winterton Eugene February 07, 2016, 1:11 AM

## 2016-02-15 NOTE — Discharge Summary (Signed)
[facility] - [place] at [facility]   PATIENT NAME: Larry PotterRichard Shakespeare    MR#:  [id number]  DATE OF BIRTH:  08/10/1946  DATE OF ADMISSION:  01/01/2016 ADMITTING PHYSICIAN: Milagros LollSrikar Sudini, MD  DATE OF DISCHARGE: 01/25/2016  4:17 PM  PRIMARY CARE PHYSICIAN: No primary care provider on file.    ADMISSION DIAGNOSIS:  Dyspnea [R06.00] Community acquired pneumonia [J18.9] Pulmonary infiltrates on CXR [R91.8] Sepsis, due to unspecified organism (HCC) [A41.9]  DISCHARGE DIAGNOSIS:  Acute respiratory failure with hypoxia Influenza Sepsis on admission-treated Community acquired pneumonia ruled out Upper GI bleed   SECONDARY DIAGNOSIS:  History reviewed. No pertinent past medical history.  HOSPITAL COURSE:  Larry PotterRichard Pacheco  is a 70 y.o. male admitted 01/11/2016 with chief complaint shortness of breath and cough. Please see H&P for performed by Dr. Elpidio AnisSudini for further information. Patient originally admitted with the above complaints Jonathon cervically acquired pneumonia placed on antibiotic coverage appropriately however condition progressively worsened during hospital stay sensation. He became more hypoxic and required intubation with mechanical ventilation on 01/02/2016 at that time antibiotics increased including vancomycin and Zosyn-he completed a total of 10 day course of antibiotic coverage with the assistance of infectious disease. All culture data returned negative however viral culture positive for influenza on 01/10/16 started on Tamiflu at that time. He persistently was unable to wean off of the ventilator in a progressively worsening oxygen requirements throughout his stay.  His course is also complicated by upper GI bleed requiring blood transfusion CODE BLUE 01/14/16 the patient desaturated became bradycardic-he is a which be resuscitated successfully after this event however continue to have issues with weaning oxygen. Given his multitude of medical issues case  discussed with family and they opted for comfort measures Patient was extubated 02/05/2016 and died shortly thereafter

## 2016-02-15 NOTE — Progress Notes (Signed)
*  PRELIMINARY RESULTS* Echocardiogram 2D Echocardiogram has been performed.  Larry Pacheco Hege 01/30/2016, 9:16 AM

## 2016-02-15 NOTE — Progress Notes (Signed)
Patient made comfort care. Dr Belia Heman placed orders. Patients Pastor at bedside, morphine given for comfort. Removed ET tube about 16:10. Patient passed at 16:17. Pronounced by Bevely Palmer RN. EKG asystole, no pulse, no respirations, no breath sounds heard over ascultation. Mrs. Amos called and updated, Dr Dimas Aguas, CDS and supervisor notified. At this point patient is not a donor.

## 2016-02-15 NOTE — Progress Notes (Signed)
RT to room to extubate patient to room air per Dr. Belia Heman order for terminal extubation.  Patient suctioned prior to extubation.  Patient extubated without complication.

## 2016-02-15 NOTE — Progress Notes (Signed)
[facility] - [place] at [facility]   PATIENT NAME: Larry Pacheco    MR#:  [id number]  DATE OF BIRTH:  Dec 07, 1945  SUBJECTIVE:  Unable to obtain-patient on vent Remains critically ill, started on amiodarone infusion given rapid A. fib, started on Levophed -overnight   REVIEW OF SYSTEMS:  nable to obtain given patient's mental status medical condition  DRUG ALLERGIES:  No Known Allergies  VITALS:  Blood pressure 94/63, pulse 98, temperature 99 F (37.2 C), temperature source Rectal, resp. rate 26, height  (1.676 m), weight 58.2 kg (128 lb 4.9 oz), SpO2 93 %.  PHYSICAL EXAMINATION:   VITAL SIGNS: Filed Vitals:   01/24/2016 1030 24-Jan-2016 1100  BP: 102/68 94/63  Pulse: 103 98  Temp: 99 F (37.2 C) 99 F (37.2 C)  Resp:     GENERAL:70 y.o.male critically ill intubated mechanical ventilation HEAD: Normocephalic, atraumatic.  EYES: Pupils equal, round, sluggish reactive to light. Unable to assess extraocular muscles given mental status/medical condition. No scleral icterus.  MOUTH: Moist mucosal membrane. Dentition intact. No abscess noted.  EAR, NOSE, THROAT: Clear without exudates. No external lesions.  NECK: Supple. No thyromegaly. No nodules. No JVD.  PULMONARY: diffuse coarse breath sounds without wheeze rails or rhonci. No use of accessory muscles, mechanical venlation CHEST: Nontender to palpation.  CARDIOVASCULAR: S1 and S2. Irregular. No murmurs, rubs, or gallops. No edema. Pedal pulses 2+ bilaterally.  GASTROINTESTINAL: Soft, nontender, nondistended. No masses. Positive bowel sounds. No hepatosplenomegaly.  MUSCULOSKELETAL: No swelling, clubbing, or edema. Passive Range of motion full in all extremities.  NEUROLOGIC: Unable to assess given mental status/medical condition SKIN: No ulceration, lesions, rashes, or cyanosis. Skin warm and dry. Turgor intact.  PSYCHIATRIC: Unable to assess given mental status/medical  condition       LABORATORY PANEL:   CBC  Recent Labs Lab 01/14/16 1000  WBC 23.0*  HGB 7.2*  HCT 22.1*  PLT 295   ------------------------------------------------------------------------------------------------------------------  Chemistries   Recent Labs Lab 01-24-2016 0051  NA 141  K 5.5*  CL 102  CO2 32  GLUCOSE 144*  BUN 69*  CREATININE 2.86*  CALCIUM 7.2*  MG 2.6*   ------------------------------------------------------------------------------------------------------------------  Cardiac Enzymes  Recent Labs Lab 01/14/16 2200  TROPONINI 0.32*   ------------------------------------------------------------------------------------------------------------------  RADIOLOGY:  Dg Chest Port 1 View  01/14/2016  CLINICAL DATA:  Acute respiratory failure EXAM: PORTABLE CHEST 1 VIEW COMPARISON:  12/23/2015 FINDINGS: Cardiac shadow is stable. Left-sided PICC line, endotracheal tube and nasogastric catheter are all again seen in satisfactory position. Patchy diffuse predominantly interstitial edema is noted consistent with vascular congestion. No new focal confluent infiltrate is seen. No pneumothorax is noted. IMPRESSION: Persistent changes of bilateral edema. Electronically Signed   By: Alcide Clever M.D.   On: 01/14/2016 10:14    EKG:   Orders placed or performed during the hospital encounter of 01/12/2016  . EKG 12-Lead  . EKG 12-Lead  . EKG 12-Lead  . EKG 12-Lead  . ED EKG 12-Lead  . ED EKG 12-Lead  . ED EKG  . ED EKG  . EKG 12-Lead  . EKG 12-Lead  . EKG 12-Lead  . EKG 12-Lead  . EKG 12-Lead  . EKG 12-Lead    ASSESSMENT AND PLAN:   70 year old Caucasian gentleman admitted 12/22/2015  [place]  1. Sepsis: Influenza positive -Tamiflu off antibiotics, , remained afebrile 2. Blood loss anemia: Continue Protonix, hemoglobin stable today  3. Acute respiratory failure with hypoxia full vent support wean as tolerated, 4. Nutrition -  continue tube feeds.   5. Atrial fibrillation rapid ventricular response amiodarone 6. Hyperkalemia: Follow  Patient family case discussed, proceeding with comfort measures at this point  All the records are reviewed and case discussed with Care Management/Social Workerr. Management plans discussed with the patient, family and they are in agreement.  CODE STATUS: full  TOTAL TIME TAKING CARE OF THIS PATIENT: 33 minutes.   POSSIBLE D/C IN 1-2DAYS, DEPENDING ON CLINICAL CONDITION.   Pranit Owensby,  Mardi Mainland.D on 02/08/2016 at 3:21 PM  Between 7am to 6pm - Pager - [phone]  After 6pm: House Pager: - [phone]  [facility]  Office  [phone]  CC: Primary care physician; No primary care provider on file.

## 2016-02-15 NOTE — Progress Notes (Signed)
Notified Dr. Laural Benes pt remains hypotensive despite Levophed dose of 40 mcg; pts heart rate elevated with rhythm currently switching from SVT to afibb hr 130-160's; pts most recent abg obtained on 01/14/2016; md stated he would place orders to manage change in pts condition

## 2016-02-15 NOTE — Progress Notes (Signed)
eLink Physician-Brief Progress Note Patient Name: Larry Pacheco DOB: 12-05-1945 MRN: [id number]   Date of Service  Feb 05, 2016  HPI/Events of Note  Multiple issues: 1. HR = 142. Irreg. Irreg. Looks like AFIB. Troponin already elevated at 0.7. 3. BP = 82/48.  eICU Interventions  Will order. 1. 0.9 NaCl 1 liter IV over 1 hour now. 2. Amiodarone IV load and infusion.  3. ABG now. 4. BMP and Mg++ level now.      Intervention Category Major Interventions: Arrhythmia - evaluation and management;Hypotension - evaluation and management  Lenell Antu 05-Feb-2016, 12:44 AM

## 2016-02-15 NOTE — Progress Notes (Addendum)
PHARMACY - CRITICAL CARE PROGRESS NOTE  Pharmacy Consult for electrolyte management    No Known Allergies  Patient Measurements: Height:  (167.6 cm) Weight: 128 lb 4.9 oz (58.2 kg) IBW/kg (Calculated) : 63.8   Vital Signs: Temp: 99 F (37.2 C) (03/01 1100) BP: 94/63 mmHg (03/01 1100) Pulse Rate: 98 (03/01 1100) Intake/Output from previous day: 02/28 0701 - 03/01 0700 In: 2125.1 [I.V.:1925.1; NG/GT:200] Out: 780 [Urine:780] Intake/Output from this shift:   Vent settings for last 24 hours: Vent Mode:  [-] PCV FiO2 (%):  [100 %] 100 % Set Rate:  [14 bmp-24 bmp] 24 bmp Vt Set:  [430 mL] 430 mL PEEP:  [10 cmH20-15 cmH20] 10 cmH20 Plateau Pressure:  [28 cmH20-29 cmH20] 29 cmH20  Labs:  Recent Labs  Jan 14, 2016 0446 01/14/16 0453 01/14/16 1000 01/21/2016 0051  WBC 20.2*  --  23.0*  --   HGB 8.5*  --  7.2*  --   HCT 25.2*  --  22.1*  --   PLT 428  --  295  --   CREATININE 1.08 1.86* 2.22* 2.86*  MG  --   --  2.8* 2.6*  PHOS  --   --  7.7*  --    Estimated Creatinine Clearance: 19.5 mL/min (by C-G formula based on Cr of 2.86).   Recent Labs  01/29/2016 0740 01/30/2016 1119 02/12/2016 1227  GLUCAP 118* 92 80    Microbiology: Recent Results (from the past 720 hour(s))  Rapid Influenza A&B Antigens ([facility] only)     Status: None   Collection Time: 01/12/2016  3:01 PM  Result Value Ref Range Status   Influenza A ([facility]) NEGATIVE  Final   Influenza B ([facility]) NEGATIVE  Final  Blood Culture (routine x 2)     Status: None   Collection Time: 01/06/2016  3:55 PM  Result Value Ref Range Status   Specimen Description BLOOD RIGHT ASSIST CONTROL  Final   Special Requests   Final    BOTTLES DRAWN AEROBIC AND ANAEROBIC  6CC AERO, 5CC ANA   Culture  Setup Time   Final    GRAM POSITIVE COCCI ANAEROBIC BOTTLE ONLY CRITICAL RESULT CALLED TO, READ BACK BY AND VERIFIED WITH: Ihor Austin 01/02/16 1250PM MLM Organism ID to follow    Culture   Final    STAPHYLOCOCCUS  SACCHAROLYTICUS ANAEROBIC BOTTLE ONLY Results consistent with contamination.    Report Status 01/05/2016 FINAL  Final  Blood Culture ID Panel (Reflexed)     Status: None   Collection Time: 12/31/2015  3:55 PM  Result Value Ref Range Status   Enterococcus species NOT DETECTED NOT DETECTED Final   Listeria monocytogenes NOT DETECTED NOT DETECTED Final   Staphylococcus species NOT DETECTED NOT DETECTED Final   Staphylococcus aureus NOT DETECTED NOT DETECTED Final   Streptococcus species NOT DETECTED NOT DETECTED Final   Streptococcus agalactiae NOT DETECTED NOT DETECTED Final   Streptococcus pneumoniae NOT DETECTED NOT DETECTED Final   Streptococcus pyogenes NOT DETECTED NOT DETECTED Final   Acinetobacter baumannii NOT DETECTED NOT DETECTED Final   Enterobacteriaceae species NOT DETECTED NOT DETECTED Final   Enterobacter cloacae complex NOT DETECTED NOT DETECTED Final   Escherichia coli NOT DETECTED NOT DETECTED Final   Klebsiella oxytoca NOT DETECTED NOT DETECTED Final   Klebsiella pneumoniae NOT DETECTED NOT DETECTED Final   Proteus species NOT DETECTED NOT DETECTED Final   Serratia marcescens NOT DETECTED NOT DETECTED Final   Haemophilus influenzae NOT DETECTED NOT DETECTED Final   Neisseria  meningitidis NOT DETECTED NOT DETECTED Final   Pseudomonas aeruginosa NOT DETECTED NOT DETECTED Final   Candida albicans NOT DETECTED NOT DETECTED Final   Candida glabrata NOT DETECTED NOT DETECTED Final   Candida krusei NOT DETECTED NOT DETECTED Final   Candida parapsilosis NOT DETECTED NOT DETECTED Final   Candida tropicalis NOT DETECTED NOT DETECTED Final   Carbapenem resistance NOT DETECTED NOT DETECTED Final   Methicillin resistance NOT DETECTED NOT DETECTED Final   Vancomycin resistance NOT DETECTED NOT DETECTED Final  Blood Culture (routine x 2)     Status: None   Collection Time: 01/03/2016  4:01 PM  Result Value Ref Range Status   Specimen Description BLOOD LEFT ASSIST CONTROL  Final    Special Requests   Final    BOTTLES DRAWN AEROBIC AND ANAEROBIC  3CC AERO, 2CC ANA   Culture NO GROWTH 5 DAYS  Final   Report Status 01/04/2016 FINAL  Final  Urine culture     Status: None   Collection Time: 01/03/16  6:36 PM  Result Value Ref Range Status   Specimen Description URINE, RANDOM  Final   Special Requests NONE  Final   Culture NO GROWTH 1 DAY  Final   Report Status 01/01/2016 FINAL  Final  MRSA PCR Screening     Status: None   Collection Time: 01/01/16  2:08 AM  Result Value Ref Range Status   MRSA by PCR NEGATIVE NEGATIVE Final    Comment:        The GeneXpert MRSA Assay (FDA approved for NASAL specimens only), is one component of a comprehensive MRSA colonization surveillance program. It is not intended to diagnose MRSA infection nor to guide or monitor treatment for MRSA infections.   Gastrointestinal Panel by PCR , Stool     Status: None   Collection Time: 01/01/16  6:59 AM  Result Value Ref Range Status   Campylobacter species NOT DETECTED NOT DETECTED Final   Plesimonas shigelloides NOT DETECTED NOT DETECTED Final   Salmonella species NOT DETECTED NOT DETECTED Final   Yersinia enterocolitica NOT DETECTED NOT DETECTED Final   Vibrio species NOT DETECTED NOT DETECTED Final   Vibrio cholerae NOT DETECTED NOT DETECTED Final   Enteroaggregative E coli (EAEC) NOT DETECTED NOT DETECTED Final   Enteropathogenic E coli (EPEC) NOT DETECTED NOT DETECTED Final   Enterotoxigenic E coli (ETEC) NOT DETECTED NOT DETECTED Final   Shiga like toxin producing E coli (STEC) NOT DETECTED NOT DETECTED Final   E. coli O157 NOT DETECTED NOT DETECTED Final   Shigella/Enteroinvasive E coli (EIEC) NOT DETECTED NOT DETECTED Final   Cryptosporidium NOT DETECTED NOT DETECTED Final   Cyclospora cayetanensis NOT DETECTED NOT DETECTED Final   Entamoeba histolytica NOT DETECTED NOT DETECTED Final   Giardia lamblia NOT DETECTED NOT DETECTED Final   Adenovirus F40/41 NOT DETECTED NOT  DETECTED Final   Astrovirus NOT DETECTED NOT DETECTED Final   Norovirus GI/GII NOT DETECTED NOT DETECTED Final   Rotavirus A NOT DETECTED NOT DETECTED Final   Sapovirus (I, II, IV, and V) NOT DETECTED NOT DETECTED Final  C difficile quick scan w PCR reflex     Status: None   Collection Time: 01/01/16  6:59 AM  Result Value Ref Range Status   C Diff antigen NEGATIVE NEGATIVE Final   C Diff toxin NEGATIVE NEGATIVE Final   C Diff interpretation Negative for C. difficile  Final  Culture, expectorated sputum-assessment     Status: None   Collection Time: 01/01/16  10:46 AM  Result Value Ref Range Status   Specimen Description SPUTUM  Final   Special Requests Normal  Final   Sputum evaluation THIS SPECIMEN IS ACCEPTABLE FOR SPUTUM CULTURE  Final   Report Status 01/01/2016 FINAL  Final  Culture, respiratory (NON-Expectorated)     Status: None   Collection Time: 01/01/16 10:46 AM  Result Value Ref Range Status   Specimen Description SPUTUM  Final   Special Requests Normal Reflexed from [id number]  Final   Gram Stain   Final    FAIR SPECIMEN - 70-80% WBCS FEW SQUAMOUS EPITHELIAL CELLS PRESENT MODERATE WBC SEEN MODERATE YEAST FEW GRAM NEGATIVE RODS RARE GRAM POSITIVE COCCI IN PAIRS    Culture LIGHT GROWTH CANDIDA ALBICANS  Final   Report Status 01/04/2016 FINAL  Final  Fungus Culture with Smear     Status: None (Preliminary result)   Collection Time: 01/02/16  2:52 PM  Result Value Ref Range Status   Specimen Description BRONCHIAL WASHINGS  Final   Special Requests Normal  Final   Culture CANDIDA ALBICANS  Final   Report Status PENDING  Incomplete  Culture, bal-quantitative     Status: None   Collection Time: 01/02/16  2:52 PM  Result Value Ref Range Status   Specimen Description BRONCHIAL WASHINGS  Final   Special Requests Normal  Final   Gram Stain   Final    FAIR SPECIMEN - 70-80% WBCS FEW SQUAMOUS EPITHELIAL CELLS PRESENT MODERATE WBC SEEN MANY YEAST    Culture HEAVY GROWTH  CANDIDA ALBICANS  Final   Report Status 01/06/2016 FINAL  Final  Virus culture     Status: Abnormal   Collection Time: 01/02/16  2:52 PM  Result Value Ref Range Status   Viral Culture Comment (A)  Final    Comment: (NOTE) Positive Influenza A detected. Performed At: [facility] [address] [place], [place] [id number] Mila Homer MD BJ:[id number]    Source of Sample BRONCHIAL WASHINGS  Final  Culture, blood (Routine X 2) w Reflex to ID Panel     Status: None   Collection Time: 01/06/16 10:42 AM  Result Value Ref Range Status   Specimen Description BLOOD RIGHT ASSIST CONTROL  Final   Special Requests BOTTLES DRAWN AEROBIC AND ANAEROBIC 7CCAERO,7CCANA  Final   Culture NO GROWTH 5 DAYS  Final   Report Status 01/11/2016 FINAL  Final  Culture, blood (Routine X 2) w Reflex to ID Panel     Status: None   Collection Time: 01/06/16 10:51 AM  Result Value Ref Range Status   Specimen Description BLOOD LEFT ASSIST CONTROL  Final   Special Requests BOTTLES DRAWN AEROBIC AND ANAEROBIC 5CCAERO,5CCANA  Final   Culture NO GROWTH 5 DAYS  Final   Report Status 01/11/2016 FINAL  Final  Culture, respiratory (NON-Expectorated)     Status: None (Preliminary result)   Collection Time: 01/06/16 12:08 PM  Result Value Ref Range Status   Specimen Description TRACHEAL ASPIRATE  Final   Special Requests NONE  Final   Gram Stain   Final    FEW WBC SEEN MANY YEAST GOOD SPECIMEN - 80-90% WBCS    Culture   Final    HEAVY GROWTH CANDIDA ALBICANS MOLD SENT TO STATE FOR IDENTIFICATION    Report Status PENDING  Incomplete  Culture, expectorated sputum-assessment     Status: None   Collection Time: 01/09/16  3:09 PM  Result Value Ref Range Status   Specimen Description TRACHEAL ASPIRATE  Final  Special Requests Normal  Final   Sputum evaluation THIS SPECIMEN IS ACCEPTABLE FOR SPUTUM CULTURE  Final   Report Status 01/09/2016 FINAL  Final  Culture, respiratory (NON-Expectorated)      Status: None (Preliminary result)   Collection Time: 01/09/16  3:09 PM  Result Value Ref Range Status   Specimen Description TRACHEAL ASPIRATE  Final   Special Requests Normal Reflexed from [id number]  Final   Gram Stain FEW WBC SEEN MODERATE YEAST   Final   Culture MOLD SENT TO STATE FOR IDENTIFICATION   Final   Report Status PENDING  Incomplete    Medications:  Scheduled:  . antiseptic oral rinse  7 mL Mouth Rinse QID  . chlorhexidine gluconate  15 mL Mouth Rinse BID  . feeding supplement (PRO-STAT SUGAR FREE 64)  30 mL Per Tube Daily  . free water  200 mL Per Tube 3 times per day  . heparin subcutaneous  5,000 Units Subcutaneous Q12H  . insulin aspart  0-15 Units Subcutaneous 6 times per day  . insulin glargine  10 Units Subcutaneous Daily  . metoprolol  5 mg Intravenous Once  . metoprolol tartrate  50 mg Per Tube Q8H  . oseltamivir  30 mg Per Tube Daily  . pantoprazole (PROTONIX) IV  40 mg Intravenous Q12H  . QUEtiapine  100 mg Oral QHS  . sodium chloride flush  10-40 mL Intracatheter Q12H   Infusions:  . sodium chloride 10 mL/hr at February 06, 2016 2200  . amiodarone 30 mg/hr (01/23/2016 1331)  . dextrose 50 mL (01/19/2016 1331)  . feeding supplement (VITAL AF 1.2 CAL) 1,000 mL (01/14/16 0725)  . fentaNYL infusion INTRAVENOUS 50 mcg/hr (02/06/2016 1046)  . norepinephrine (LEVOPHED) Adult infusion 5 mcg/min (02/06/2016 1010)  . propofol (DIPRIVAN) infusion 25 mcg/kg/min (01/31/2016 1046)  . vasopressin (PITRESSIN) infusion - *FOR SHOCK* 0.03 Units/min (01/27/2016 0454)    Assessment: Pharmacy consulted for electrolyte management for 70 yo male requiring mechanical ventilation and receiving tube feeds, Vital AF, at 55mL/hr.     Plan:  Potassium and magnesium are elevated. Will f/u AM labs.   Luisa Hart D 01/28/2016,3:50 PM

## 2016-02-15 DEATH — deceased

## 2016-02-17 LAB — ACID FAST SMEAR+CULTURE W/RFLX (ARMC ONLY)
Acid Fast Culture: NEGATIVE
Acid Fast Smear: NEGATIVE

## 2017-11-13 IMAGING — DX DG CHEST 1V PORT
1 series · 1 of 1 positions shown · non-contrast
Comparison: Chest radiograph earlier same day.

CLINICAL DATA: Patient status post PICC line placement.

EXAM:
PORTABLE CHEST 1 VIEW

[chest ap]
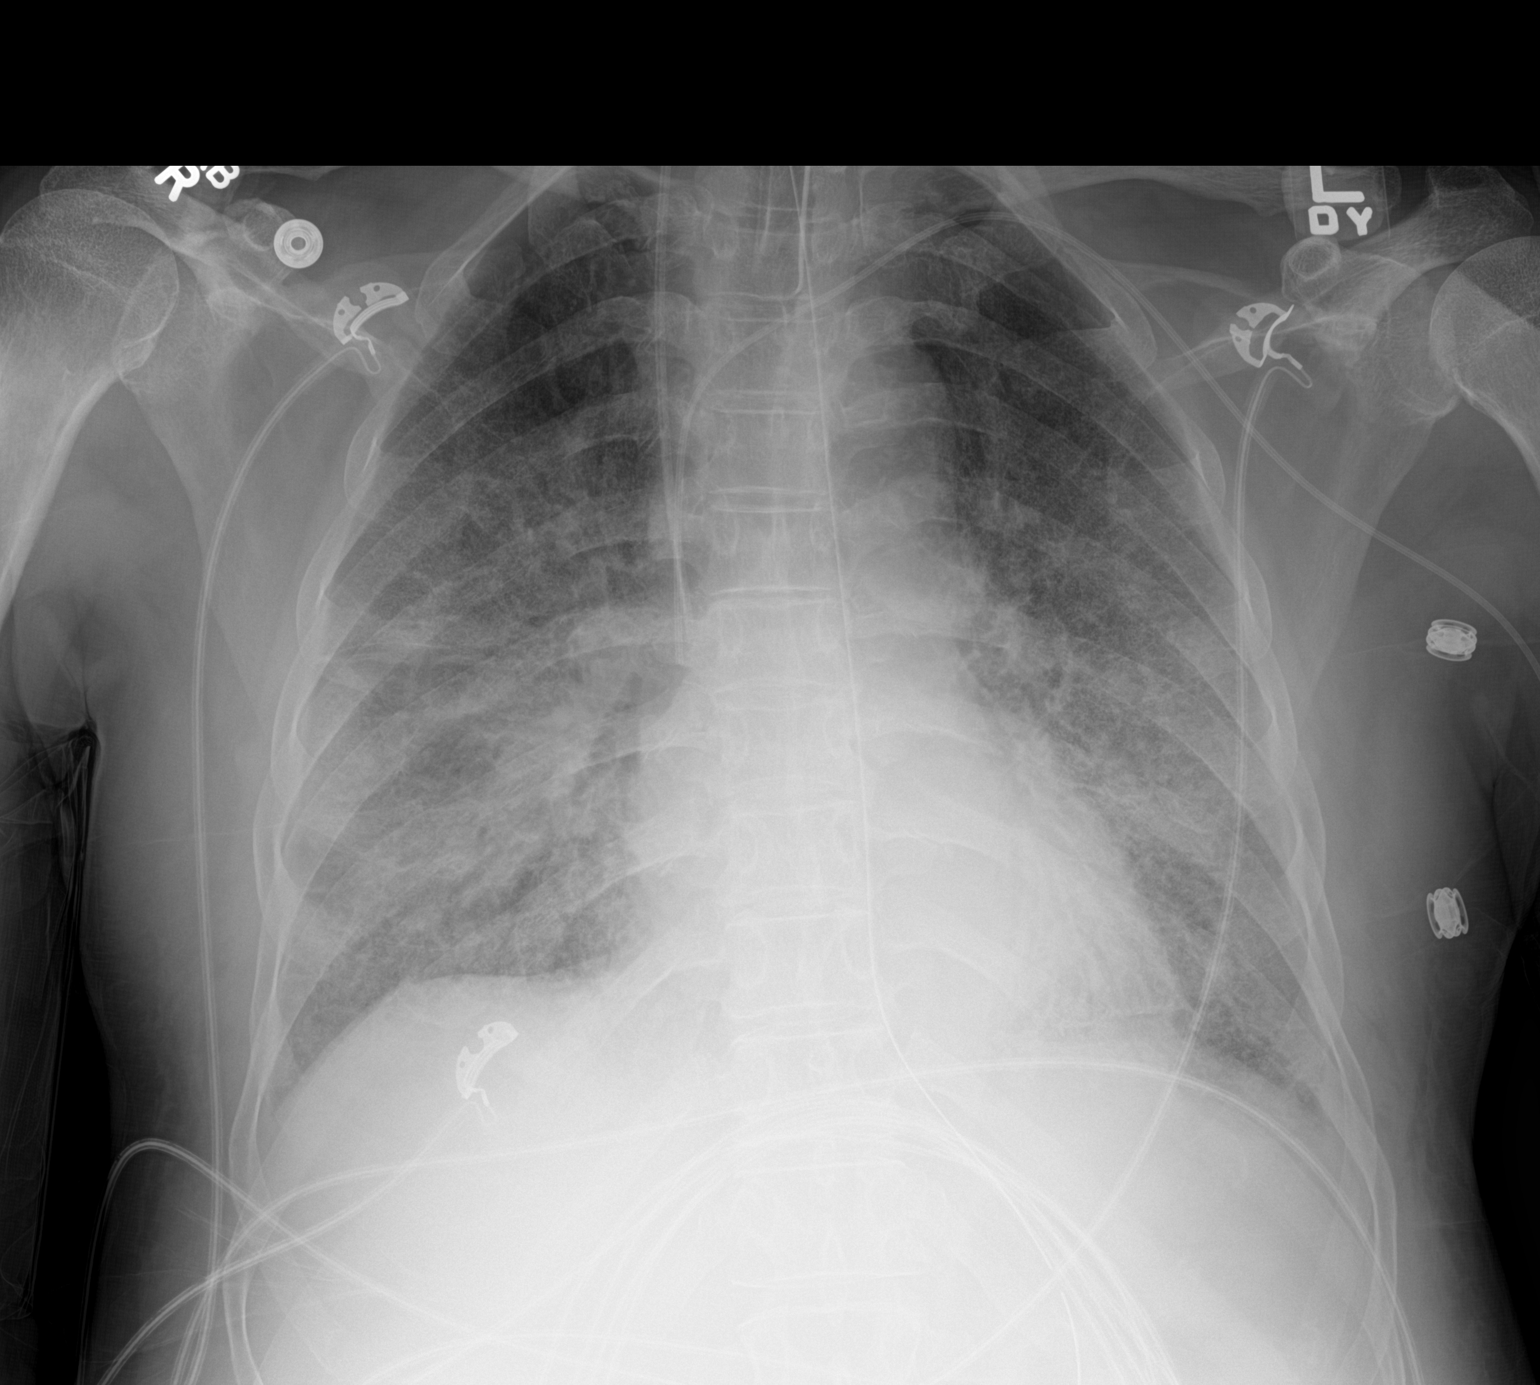

[1 of 1 positions shown; findings below may reference images not displayed]

FINDINGS: ET tube terminates in the mid trachea. Right IJ central venous
catheter tip projects at the superior cavoatrial junction. Left
upper extremity PICC line has been retracted with tip projecting at
the superior cavoatrial junction. Enteric tube courses inferior to
the diaphragm. Stable cardiac and mediastinal contours. Unchanged
diffuse bilateral airspace opacities. No pleural effusion or
pneumothorax.
IMPRESSION: Interval retraction left upper extremity PICC line with tip
projecting over the superior cavoatrial junction. Remainder of the
support apparatus are stable.

Unchanged diffuse bilateral airspace opacities.
